# Patient Record
Sex: Female | Born: 1975
Health system: Southern US, Community
[De-identification: ages and names within clinical notes are randomized; demographics above are authoritative.]

## PROBLEM LIST (undated history)

## (undated) DIAGNOSIS — J45909 Unspecified asthma, uncomplicated: Secondary | ICD-10-CM

## (undated) DIAGNOSIS — N200 Calculus of kidney: Secondary | ICD-10-CM

## (undated) DIAGNOSIS — R51 Headache: Secondary | ICD-10-CM

## (undated) DIAGNOSIS — F329 Major depressive disorder, single episode, unspecified: Secondary | ICD-10-CM

## (undated) DIAGNOSIS — F909 Attention-deficit hyperactivity disorder, unspecified type: Secondary | ICD-10-CM

## (undated) DIAGNOSIS — J189 Pneumonia, unspecified organism: Secondary | ICD-10-CM

## (undated) DIAGNOSIS — T7840XA Allergy, unspecified, initial encounter: Secondary | ICD-10-CM

## (undated) DIAGNOSIS — D649 Anemia, unspecified: Secondary | ICD-10-CM

## (undated) DIAGNOSIS — R519 Headache, unspecified: Secondary | ICD-10-CM

## (undated) DIAGNOSIS — F419 Anxiety disorder, unspecified: Secondary | ICD-10-CM

## (undated) DIAGNOSIS — K589 Irritable bowel syndrome without diarrhea: Secondary | ICD-10-CM

## (undated) DIAGNOSIS — K8689 Other specified diseases of pancreas: Secondary | ICD-10-CM

## (undated) DIAGNOSIS — I10 Essential (primary) hypertension: Secondary | ICD-10-CM

## (undated) DIAGNOSIS — R569 Unspecified convulsions: Secondary | ICD-10-CM

## (undated) DIAGNOSIS — G8929 Other chronic pain: Secondary | ICD-10-CM

## (undated) DIAGNOSIS — E669 Obesity, unspecified: Secondary | ICD-10-CM

## (undated) DIAGNOSIS — F32A Depression, unspecified: Secondary | ICD-10-CM

## (undated) HISTORY — DX: Calculus of kidney: N20.0

## (undated) HISTORY — DX: Anxiety disorder, unspecified: F41.9

## (undated) HISTORY — DX: Irritable bowel syndrome, unspecified: K58.9

## (undated) HISTORY — DX: Depression, unspecified: F32.A

## (undated) HISTORY — DX: Headache, unspecified: R51.9

## (undated) HISTORY — DX: Headache: R51

## (undated) HISTORY — DX: Unspecified asthma, uncomplicated: J45.909

## (undated) HISTORY — DX: Allergy, unspecified, initial encounter: T78.40XA

## (undated) HISTORY — DX: Major depressive disorder, single episode, unspecified: F32.9

## (undated) HISTORY — DX: Anemia, unspecified: D64.9

## (undated) HISTORY — DX: Unspecified convulsions: R56.9

## (undated) HISTORY — PX: DILATION AND CURETTAGE OF UTERUS: SHX78

## (undated) HISTORY — PX: WISDOM TOOTH EXTRACTION: SHX21

## (undated) HISTORY — DX: Other chronic pain: G89.29

## (undated) HISTORY — DX: Obesity, unspecified: E66.9

## (undated) HISTORY — DX: Essential (primary) hypertension: I10

## (undated) HISTORY — PX: BREAST SURGERY: SHX581

## (undated) HISTORY — PX: ABLATION: SHX5711

---
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
## 2007-03-06 ENCOUNTER — Emergency Department (HOSPITAL_COMMUNITY): Admission: EM | Admit: 2007-03-06 | Discharge: 2007-03-06 | Payer: Self-pay | Admitting: Emergency Medicine

## 2007-04-13 LAB — CONVERTED CEMR LAB: Pap Smear: NORMAL

## 2007-08-09 ENCOUNTER — Encounter (INDEPENDENT_AMBULATORY_CARE_PROVIDER_SITE_OTHER): Payer: Self-pay | Admitting: Obstetrics and Gynecology

## 2007-08-09 ENCOUNTER — Inpatient Hospital Stay (HOSPITAL_COMMUNITY): Admission: AD | Admit: 2007-08-09 | Discharge: 2007-08-10 | Payer: Self-pay | Admitting: Obstetrics and Gynecology

## 2010-02-11 ENCOUNTER — Ambulatory Visit: Payer: Self-pay | Admitting: Internal Medicine

## 2010-02-11 DIAGNOSIS — J45909 Unspecified asthma, uncomplicated: Secondary | ICD-10-CM | POA: Insufficient documentation

## 2010-02-11 DIAGNOSIS — G43909 Migraine, unspecified, not intractable, without status migrainosus: Secondary | ICD-10-CM | POA: Insufficient documentation

## 2010-02-11 DIAGNOSIS — J069 Acute upper respiratory infection, unspecified: Secondary | ICD-10-CM | POA: Insufficient documentation

## 2010-02-11 DIAGNOSIS — J309 Allergic rhinitis, unspecified: Secondary | ICD-10-CM | POA: Insufficient documentation

## 2010-02-15 DIAGNOSIS — Z87442 Personal history of urinary calculi: Secondary | ICD-10-CM | POA: Insufficient documentation

## 2010-04-08 ENCOUNTER — Ambulatory Visit: Payer: Self-pay | Admitting: Internal Medicine

## 2010-05-12 NOTE — Assessment & Plan Note (Signed)
Summary: new acute congestion-low grade//ccm   Vital Signs:  Patient profile:   35 year old female Menstrual status:  preg LMP:     12/07/2009 Height:      66 inches Weight:      233 pounds BMI:     37.74 O2 Sat:      99 % on Room air Temp:     98.6 degrees F oral Pulse rate:   72 / minute BP sitting:   110 / 70  (right arm) Cuff size:   regular  Vitals Entered By: Romualdo Bolk, CMA (AAMA) (February 11, 2010 11:58 AM)  O2 Flow:  Room air CC: New Pt- Pt is 8 weeks preg.- Coughing, congestion, stuffy head, runny nose, nausea, low grade temp, sore throat due to drainage, tender around neck.  LMP (date): 12/07/2009     Menstrual Status preg Enter LMP: 12/07/2009 Last PAP Result normal   History of Present Illness: Sylvia Cantrell comes in today  for above  acute work in  as a new patient .she's generally well but is now eight weeks pregnant. She's unsure what medications  s he can take.  Husband  became sick   3 days ago  andt hen she got sick for one day that has [prgressed to  now "coughig a lot ".  ? if had a fever.   She states that it is very hard  to breath through  her nose and hard to sleep. Took benadryl and tylenol x 1 .  Has appt with  Nestor Ramp  Nov 17th.   First OB appt. she has a history of asked mom but no chest pain wheezing or recurrence. She also has a history of seasonal allergies. These do not seem to be in play today.  Preventive Care Screening  Pap Smear:    Date:  04/13/2007    Results:  normal    Preventive Screening-Counseling & Management  Alcohol-Tobacco     Alcohol drinks/day: 0     Smoking Status: quit  Caffeine-Diet-Exercise     Caffeine use/day: 1-2     Does Patient Exercise: yes  Safety-Violence-Falls     Seat Belt Use: yes     Firearms in the Home: no firearms in the home     Smoke Detectors: yes      Drug Use:  no.    Current Medications (verified): 1)  Prenatal Vitamins 0.8 Mg Tabs (Prenatal Multivit-Min-Fe-Fa) 2)   Diphenhydramine Hcl 25 Mg Caps (Diphenhydramine Hcl) .... As Needed  Allergies (verified): 1)  Codeine  Past History:  Past Medical History: Allergic rhinitis  worse in sprin g  less in fall.  hx of allergy shots  Asthma quiescent   G3 P1    2  miscarriages   2008 : 6 weeks and 14 weeks.   Varicella  reactive  depression from dads  death  lexapro.  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 HHof 3  Husband  an daughter  Emergency planning/management officer.   Smoking Status:  quit Caffeine use/day:  1-2 Does Patient Exercise:  yes Drug Use:  no Seat Belt Use:  yes  Review of Systems  The patient denies anorexia, weight loss, decreased hearing, hoarseness, chest pain, syncope, dyspnea on exertion, peripheral edema, prolonged cough, headaches, abdominal pain, melena, hematochezia, severe indigestion/heartburn, difficulty walking, abnormal bleeding, enlarged lymph nodes, and angioedema.    Physical Exam  General:  congested coughing in nad looks tired but non toxic  Head:  Normocephalic and atraumatic without obvious abnormalities. No apparent alopecia or balding. Eyes:  clear  eoms nl  Ears:  R ear normal, L ear normal, and no external deformities.   Nose:  no external deformity, no external erythema, and no nasal discharge.  very congested  face non tender Mouth:  pharynx pink and moist.   Neck:  No deformities, masses, or tenderness noted. Lungs:  Normal respiratory effort, chest expands symmetrically. Lungs are clear to auscultation, no crackles or wheezes.no  dullness.   Heart:  Normal rate and regular rhythm. S1 and S2 normal without gallop, murmur, click, rub or other extra sounds.no lifts.   Pulses:  pulses intact without delay   Extremities:  no clubbing cyanosis or edema  Neurologic:  grossly nonfocal Skin:  turgor normal and color normal.   Cervical Nodes:  No lymphadenopathy noted Psych:  Oriented X3, normally interactive, good eye contact, not anxious appearing, and not depressed appearing.     Impression & Recommendations:  Problem # 1:  UPPER RESPIRATORY INFECTION, VIRAL (ICD-465.9) this appears to be viral and probably self-limiting. Inform patient the pregnancy itself can cause increasing nasal congestion and  if gets alarm findings and signs to call us Her updated medication list for this problem includes:    Diphenhydramine Hcl 25 Mg Caps (Diphenhydramine hcl) .Marland Kitchen... As needed  Problem # 2:  ASTHMA (ICD-493.90) appears to be quiescent at present.   call for advice on intervention or recheck if having asthma symptoms.  Problem # 3:  PREGNANCY (ICD-V22.2) early with history of recurrent loss.  caution with  medications.     Complete Medication List: 1)  Prenatal Vitamins 0.8 Mg Tabs (Prenatal multivit-min-fe-fa) 2)  Diphenhydramine Hcl 25 Mg Caps (Diphenhydramine hcl) .... As needed  Patient Instructions: 1)  I think this is a viral respiratory infection . 2)  ok to use tylenol for body aches . 3)  Saline nose spray for nose 4)  Can use afrin  decongestant nose spray for no more than 3 days in a row and then intermittently only to avoid rebound. 5)   Call if fever last or shortness of breath or wheezing  expect  cough to continue for 1-2 weeks but should be improving next week 6)  .   7)  Acute sinusitis symptoms for less than 10 days are not helped by antibiotics. Use warm moist compresses, and over the counter decongestants( only as directed). Call if no improvement in 5-7 days, sooner if increasing pain, fever, or new  symptoms.    Orders Added: 1)  New Patient Level III [16073]

## 2010-05-12 NOTE — Letter (Signed)
Summary: Patient History Form  Patient History Form   Imported By: Maryln Gottron 02/23/2010 13:09:24  _____________________________________________________________________  External Attachment:    Type:   Image     Comment:   External Document

## 2010-08-14 ENCOUNTER — Inpatient Hospital Stay (HOSPITAL_COMMUNITY)
Admission: AD | Admit: 2010-08-14 | Discharge: 2010-08-14 | Disposition: A | Discharge status: Home / Self Care | Payer: Medicaid Other | Source: Ambulatory Visit | Attending: Obstetrics and Gynecology | Admitting: Obstetrics and Gynecology

## 2010-08-14 DIAGNOSIS — O479 False labor, unspecified: Secondary | ICD-10-CM | POA: Insufficient documentation

## 2010-08-14 LAB — URINALYSIS, ROUTINE W REFLEX MICROSCOPIC
Bilirubin Urine: NEGATIVE
Glucose, UA: NEGATIVE mg/dL
Ketones, ur: 15 mg/dL — AB
Nitrite: NEGATIVE
Protein, ur: NEGATIVE mg/dL
Specific Gravity, Urine: >1.03 — ABNORMAL HIGH (ref 1.005–1.030)
Urobilinogen, UA: 0.2 mg/dL (ref 0.0–1.0)
pH: 5.5 (ref 5.0–8.0)

## 2010-08-14 LAB — URINE MICROSCOPIC-ADD ON

## 2010-08-25 NOTE — H&P (Signed)
Sylvia Cantrell, Sylvia Cantrell          ACCOUNT NO.:  [id number]   MEDICAL RECORD NO.:  [id number]          PATIENT TYPE:  INP   LOCATION:  9373                          FACILITY:  [facility]   PHYSICIAN:  Naima A. Dillard, M.D. DATE OF BIRTH:  [birth year]   DATE OF ADMISSION:  08/09/2007  DATE OF DISCHARGE:                              HISTORY & PHYSICAL   This is a 35 year old gravida 3, para 1-0-1-1, at 19-3/7 weeks, who  presents for Cytotec induction of labor secondary to intrauterine fetal  demise in the second trimester.  Pregnancy has been followed by the  Nurse-Midwife service and remarkable for:  1. Frequent UTI.  2. History of irregular cycles.  3. Asthma.  4. History of IVS.  5. History of breast reduction.   HISTORY OF CURRENT PREGNANCY:  The patient presented for care in the  first trimester.  She had an ultrasound to confirm date.  The patient  was noted at her new OB appointment to have a prolapse of her uterus and  cervix, which was found to be low in the vagina whenever the patient was  standing.  The patient was not having any pain with that at that time  and so no treatment was pursued at that time.  She had new OB labs done  and they were all within normal limits, and she came back at 16 weeks  and was doing well.  She declined a quad screen.  She presented on August 07, 2007, for some spotting and cramping that she had had for 2 days  after walking a lot, doing shopping.  Upon exam, fetal heart tones were  not able to be obtained and ultrasound was done, and she was found to  have an IUFD with a fetal size of 14 weeks and 6 days and no fetal  movement or fetal heart rate documented with M-mode and Dopplers, and  she was counseled appropriately and elected to proceed with Cytotec  induction of labor, which was planned to occur today with Dr. Normand Sloop.   PRENATAL LABS:  Hemoglobin 10.9 and platelets 299.  Hepatitis negative,  rubella immune, RPR nonreactive, blood type A+,  antibody screen  negative, chlamydia negative, GC negative, and HIV negative.   ALLERGIES:  CODEINE and SULFA, both cause itching.   OB HISTORY:  Remarkable for a vaginal delivery in 2003 of a female  infant at 22 weeks' gestation, weighing 7 pounds 1 ounce with no  complications.  The patient had a spontaneous abortion in the first  trimester in November 2008.   MEDICAL HISTORY:  Remarkable for past history of anemia, but none  requiring transfusion.  She has a history of childhood varicella and  history of asthma for which she uses albuterol inhalers as needed.  She  also has a history of irritable bowel syndrome.   SURGICAL HISTORY:  Remarkable for breast reduction at age 32,  tonsillectomy as a child, and there was another surgery at age 38 but I  cannot read the writing on her record.   FAMILY HISTORY:  Remarkable for father and grandfather with heart  disease, mother with  hypertension, aunts and grandmother with anemia,  grandmother with hyperthyroidism, aunt and mother with hypothyroidism,  and grandmother with kidney disease.   GENETIC HISTORY:  Unremarkable except for the patient's husband who had  a heart attack in January 2009, he did survive that episode and is  currently age 54.   SOCIAL HISTORY:  The patient is married to Franco Nones who is involved  and supportive.  She does not report a religious affiliation.  She  denies any alcohol, tobacco, or drug use.  Family is supportive.   OBJECTIVE DATA:  VITAL SIGNS:  Stable, afebrile.  HEENT:  Within normal limits.  Thyroid normal, not enlarged.  CHEST:  Clear to auscultation.  HEART:  Heart rate, regular rate and rhythm.  ABDOMEN:  Gravid at 16-20 weeks' size.  Fetal heart tones absent.  PELVIC:  Her pelvic exam is deferred, pending exam by Dr. Normand Sloop.  EXTREMITIES:  Within normal limits.   ASSESSMENT:  1. Intrauterine pregnancy at 19-3/7 weeks by dates.  2. Intrauterine fetal demise.   PLAN:  1. Admit to  AICU.  2. Cytotec induction of labor.  3. Further orders to follow.      Sylvia Cantrell, C.N.M.      Naima A. Normand Sloop, M.D.  Electronically Signed    MLW/MEDQ  D:  08/09/2007  T:  08/10/2007  Job:  [id number]

## 2010-08-25 NOTE — Op Note (Signed)
NAMELOTOYA, CASELLA          ACCOUNT NO.:  [id number]   MEDICAL RECORD NO.:  [id number]          PATIENT TYPE:  INP   LOCATION:  9316                          FACILITY:  [facility]   PHYSICIAN:  Naima A. Dillard, M.D. DATE OF BIRTH:  06-Dec-1975   DATE OF PROCEDURE:  08/09/2007  DATE OF DISCHARGE:                               OPERATIVE REPORT   PREOPERATIVE DIAGNOSIS:  Status post vaginal delivery with fetal demise  with retained placenta.   POSTOPERATIVE DIAGNOSIS:  Status post vaginal delivery with fetal demise  with retained placenta.   PROCEDURE:  Evacuation of retained placenta under ultrasound guidance.   SURGEON:  Naima A. Dillard, MD.   ANESTHESIA:  MAC and local.   COMPLICATIONS:  None.   FINDINGS:  About 12 weeks size uterus with retained placenta, products  of conception or placenta.  The products were sent to pathology.  The  fetus was also sent to pathology.  From the ICU, the patient went to  recovery room in stable condition.   PROCEDURE IN DETAIL:  The patient was taken to the operating room where  she was given MAC anesthesia, placed in dorsal lithotomy position,  prepped and draped in normal sterile fashion.  Her bladder was drained  with straight catheter.  The patient was found to have a 12 weeks size  uterus with some placenta extruded from the cervix.  The cervix was  about 2 cm dilated.  The placenta tissue that was extruded from the  cervix was grasped with ring forceps.  I then under ultrasound guidance,  used a size 9 suction curettage and suctioned most of the placenta out.  I then used a sharp curette to get any remaining bits.  The sharp  curettage was placed in and it was done until a gritty texture was noted  and just some mild blood.  Before the procedure was done, the patient  was given 20 mL of 2% lidocaine for cervical block and tenaculum was  placed on the anterior lip of the cervix.  Tenaculum was removed.  All  instruments removed from  the vagina.  Hemostasis was noted at the  tenaculum point after silver nitrate was used.  Sponge, lap, and needle  counts were correct.  The patient went to recovery room in stable  condition.      Naima A. Normand Sloop, M.D.  Electronically Signed     NAD/MEDQ  D:  08/10/2007  T:  08/10/2007  Job:  [id number]

## 2010-08-25 NOTE — Discharge Summary (Signed)
Sylvia Cantrell          ACCOUNT NO.:  [id number]   MEDICAL RECORD NO.:  [id number]          PATIENT TYPE:  INP   LOCATION:  9316                          [facility]ACILITY:  [facility]   PHYSICIAN:  Hal Morales, M.D.DATE OF BIRTH:  11-13-1975   DATE OF ADMISSION:  08/09/2007  DATE OF DISCHARGE:  08/10/2007                               DISCHARGE SUMMARY   ADMITTING DIAGNOSIS:  Intrauterine fetal demise at 19-3/7th weeks by  dates.   DISCHARGE DIAGNOSES:  1. Intrauterine fetal demise at 19-3/7th weeks by dates.  2. Retained placenta.   PROCEDURES:  1. Cytotec induction for second trimester fetal demise.  2. D and E for retained placenta.  3. MAC and local anesthesia.   HOSPITAL COURSE:  Sylvia Cantrell is a 35 year old gravida 3, para 1-0-1-  1 who was at 19-3/7th weeks who was discovered to have an intrauterine  fetal demise on an office evaluation on April 27.  At that time,  ultrasound was done to confirm the fetal demise.  She elected to repeat  the ultrasound.  This confirmed the finding.  The patient was counseled  on her options and she elected to proceed with Cytotec induction of  labor.  The patient was admitted on April 29, around noon.  Her  pregnancy had been remarkable for the following.  1. History of frequent UTIs.  2. History or irregular cycles.  3. Asthma.  4. History of IVS.  5. History of breast reduction.  6. Uterine prolapse and cystocele.   The patient's blood type was A+.  Her hemoglobin was 10.9 at her new OB  visit.  Cervix was dilated at the time of admission to approximately 4  cm.  Cytotec was placed.  The patient delivered at approximately 10:30  p.m.  There was an attempt by Dr. Normand Sloop to maintain a straight  placenta.  The patient was unable to tolerate this even with IV  sedation.  She was offered Pitocin versus D and C removal of placenta.  She elected D and C at that time.  She also elected to have towards  titers ANA, LAC, ACA, and  chromosome studies on the placenta.  She  declined hemoglobin A1c.  The patient was taken to the operating room  where a D and C was performed for evacuation retained placenta under  ultrasound guidance under MAC and local anesthesia.  Findings were 12  weeks size uterus with retained placenta.  Products of conception went  to pathology.  The patient tolerated the procedure well.  The patient  went to recovery room in good condition.  By August 10, 2007, essentially  post delivery day #1 and post surgery day #0, she was doing well.  She  was desiring to go home.  Her hemoglobin was 9.3, hematocrit was 27.4,  white blood cell count was 7.3, and platelet count was 217.  All the  previously noted labs were still pending.  The patient was having no  orthostatic changes.  Her clinical status was stable.  She was grieving  appropriately and had good family support.  She had been started on  doxycycline and  Methergine.  She was using Percocet for pain with good  relief.  Her bleeding was minimal.  She was deemed to receive full  benefit of her hospital stay and was discharged home.   DISCHARGE INSTRUCTIONS:  The patient is to call for any fever or  increased bleeding or any other concerns.   DISCHARGE MEDICATIONS:  1. Methergine 0.2 mg 1 p.o. q. 6 h. x2 doses to complete a full 24-      hour dosing.  2. Doxycycline 100 mg 1 p.o. b.i.d. x7 days to complete this course.  3. Percocet 5/325 one to two p.o. q. 3-4 h. p.r.n. pain for pain      management.   FOLLOWUP:  Discharge followup will occur on Aug 21, 2007 with Dr.  Normand Sloop at 11:30 a.m. at which time labs and pathology report will be  reviewed with the patient.  Support was also offered to the patient for  her loss.      Renaldo Reel Sylvia Cantrell, C.N.M.      Hal Morales, M.D.  Electronically Signed    VLL/MEDQ  D:  08/10/2007  T:  08/11/2007  Job:  [id number]

## 2010-08-28 ENCOUNTER — Inpatient Hospital Stay (HOSPITAL_COMMUNITY)
Admission: AD | Admit: 2010-08-28 | Discharge: 2010-08-28 | Disposition: A | Discharge status: Home / Self Care | Payer: Medicaid Other | Source: Ambulatory Visit | Attending: Obstetrics & Gynecology | Admitting: Obstetrics & Gynecology

## 2010-08-28 DIAGNOSIS — O479 False labor, unspecified: Secondary | ICD-10-CM | POA: Insufficient documentation

## 2010-09-07 ENCOUNTER — Inpatient Hospital Stay (HOSPITAL_COMMUNITY)
Admission: AD | Admit: 2010-09-07 | Discharge: 2010-09-10 | DRG: 775 | Disposition: A | Discharge status: Home / Self Care | Payer: Medicaid Other | Source: Ambulatory Visit | Attending: Obstetrics and Gynecology | Admitting: Obstetrics and Gynecology

## 2010-09-07 DIAGNOSIS — O09529 Supervision of elderly multigravida, unspecified trimester: Secondary | ICD-10-CM | POA: Diagnosis present

## 2010-09-07 DIAGNOSIS — O480 Post-term pregnancy: Principal | ICD-10-CM | POA: Diagnosis present

## 2010-09-07 LAB — CBC
MCH: 20.6 pg — ABNORMAL LOW (ref 26.0–34.0)
Platelets: 261 10*3/uL (ref 150–400)

## 2010-09-08 LAB — CBC
HCT: 24.9 % — ABNORMAL LOW (ref 36.0–46.0)
MCHC: 30.9 g/dL (ref 30.0–36.0)
Platelets: 203 10*3/uL (ref 150–400)
RBC: 3.57 MIL/uL — ABNORMAL LOW (ref 3.87–5.11)
RDW: 19.5 % — ABNORMAL HIGH (ref 11.5–15.5)
WBC: 14.5 10*3/uL — ABNORMAL HIGH (ref 4.0–10.5)

## 2010-09-08 LAB — URINALYSIS, DIPSTICK ONLY
Ketones, ur: NEGATIVE mg/dL
Leukocytes, UA: NEGATIVE
Protein, ur: NEGATIVE mg/dL

## 2010-09-09 LAB — CBC
Hemoglobin: 6.8 g/dL — CL (ref 12.0–15.0)
Platelets: 212 10*3/uL (ref 150–400)
RBC: 3.23 MIL/uL — ABNORMAL LOW (ref 3.87–5.11)
RDW: 18.8 % — ABNORMAL HIGH (ref 11.5–15.5)

## 2011-01-05 LAB — CBC
HCT: 27.4 — ABNORMAL LOW
Hemoglobin: 9.3 — ABNORMAL LOW
MCHC: 33.9
RDW: 17.3 — ABNORMAL HIGH

## 2011-01-05 LAB — LUPUS ANTICOAGULANT PANEL: PTT Lupus Anticoagulant: 41.8 (ref 36.3–48.8)

## 2011-01-05 LAB — TORCH-IGM(TOXO/ RUB/ CMV/ HSV) W TITER
CMV IgM: 0.14 IV
Rubella IgM Index: 0.48 IV
Toxoplasma IgM: 0.12 IV

## 2011-01-05 LAB — TORCH TITERS-IGG(TOXO/ RUB/ CMV/ HSV): Rubella IgG Scr: 47 IU/mL

## 2011-01-19 LAB — BASIC METABOLIC PANEL
CO2: 27
Chloride: 106
GFR calc Af Amer: >60
Glucose, Bld: 136 — ABNORMAL HIGH
Potassium: 3.3 — ABNORMAL LOW
Sodium: 138

## 2011-01-19 LAB — DIFFERENTIAL
Basophils Relative: 0
Eosinophils Absolute: 0.3
Lymphs Abs: 3.5
Neutro Abs: 6.1
Neutrophils Relative %: 57

## 2011-01-19 LAB — CBC
MCHC: 33.1
Platelets: 330
RBC: 4.53

## 2011-01-19 LAB — WET PREP, GENITAL
Clue Cells Wet Prep HPF POC: NONE SEEN
Trich, Wet Prep: NONE SEEN
Yeast Wet Prep HPF POC: NONE SEEN

## 2011-01-19 LAB — POCT PREGNANCY, URINE: Preg Test, Ur: NEGATIVE

## 2012-02-18 ENCOUNTER — Other Ambulatory Visit: Payer: Self-pay

## 2012-02-18 ENCOUNTER — Ambulatory Visit (INDEPENDENT_AMBULATORY_CARE_PROVIDER_SITE_OTHER): Payer: BC Managed Care – PPO | Admitting: Family Medicine

## 2012-02-18 ENCOUNTER — Encounter: Payer: Self-pay | Admitting: Family Medicine

## 2012-02-18 VITALS — BP 132/79 | HR 87 | Temp 98.7°F | Resp 18 | Ht 66.5 in | Wt 228.0 lb

## 2012-02-18 DIAGNOSIS — R509 Fever, unspecified: Secondary | ICD-10-CM

## 2012-02-18 DIAGNOSIS — N390 Urinary tract infection, site not specified: Secondary | ICD-10-CM

## 2012-02-18 DIAGNOSIS — R358 Other polyuria: Secondary | ICD-10-CM

## 2012-02-18 DIAGNOSIS — R3589 Other polyuria: Secondary | ICD-10-CM

## 2012-02-18 LAB — POCT URINALYSIS DIPSTICK
Glucose, UA: 100
Nitrite, UA: POSITIVE
Protein, UA: 100
Spec Grav, UA: 1.015
Urobilinogen, UA: 4
pH, UA: 5

## 2012-02-18 LAB — POCT UA - MICROSCOPIC ONLY
Casts, Ur, LPF, POC: NEGATIVE
Crystals, Ur, HPF, POC: NEGATIVE
Mucus, UA: POSITIVE
Yeast, UA: NEGATIVE

## 2012-02-18 LAB — POCT URINE PREGNANCY: Preg Test, Ur: NEGATIVE

## 2012-02-18 MED ORDER — CIPROFLOXACIN HCL 500 MG PO TABS: 500.0000 mg | ORAL_TABLET | Freq: Two times a day (BID) | ORAL | Status: DC

## 2012-02-18 NOTE — Patient Instructions (Addendum)
Urinary Tract Infection Urinary tract infections (UTIs) can develop anywhere along your urinary tract. Your urinary tract is your body's drainage system for removing wastes and extra water. Your urinary tract includes two kidneys, two ureters, a bladder, and a urethra. Your kidneys are a pair of bean-shaped organs. Each kidney is about the size of your fist. They are located below your ribs, one on each side of your spine. CAUSES Infections are caused by microbes, which are microscopic organisms, including fungi, viruses, and bacteria. These organisms are so small that they can only be seen through a microscope. Bacteria are the microbes that most commonly cause UTIs. SYMPTOMS  Symptoms of UTIs may vary by age and gender of the patient and by the location of the infection. Symptoms in young women typically include a frequent and intense urge to urinate and a painful, burning feeling in the bladder or urethra during urination. Older women and men are more likely to be tired, shaky, and weak and have muscle aches and abdominal pain. A fever may mean the infection is in your kidneys. Other symptoms of a kidney infection include pain in your back or sides below the ribs, nausea, and vomiting. DIAGNOSIS To diagnose a UTI, your caregiver will ask you about your symptoms. Your caregiver also will ask to provide a urine sample. The urine sample will be tested for bacteria and white blood cells. White blood cells are made by your body to help fight infection. TREATMENT  Typically, UTIs can be treated with medication. Because most UTIs are caused by a bacterial infection, they usually can be treated with the use of antibiotics. The choice of antibiotic and length of treatment depend on your symptoms and the type of bacteria causing your infection. HOME CARE INSTRUCTIONS  If you were prescribed antibiotics, take them exactly as your caregiver instructs you. Finish the medication even if you feel better after you  have only taken some of the medication.  Drink enough water and fluids to keep your urine clear or pale yellow.  Avoid caffeine, tea, and carbonated beverages. They tend to irritate your bladder.  Empty your bladder often. Avoid holding urine for long periods of time.  Empty your bladder before and after sexual intercourse.  After a bowel movement, women should cleanse from front to back. Use each tissue only once. SEEK MEDICAL CARE IF:   You have back pain.  You develop a fever.  Your symptoms do not begin to resolve within 3 days. SEEK IMMEDIATE MEDICAL CARE IF:   You have severe back pain or lower abdominal pain.  You develop chills.  You have nausea or vomiting.  You have continued burning or discomfort with urination. MAKE SURE YOU:   Understand these instructions.  Will watch your condition.  Will get help right away if you are not doing well or get worse. Document Released: 01/06/2005 Document Revised: 09/28/2011 Document Reviewed: 05/07/2011 [employer] Patient Information 2013 [employer].  

## 2012-02-18 NOTE — Progress Notes (Signed)
@[email or web address] @   Patient ID: Sylvia Cantrell MRN: [id number], DOB: [birth year], 36 y.o. Date of Encounter: 02/18/2012, 8:43 AM  Primary Physician: Lorretta Harp, MD  Chief Complaint:  Chief Complaint  Patient presents with  . Urinary Tract Infection    burns with urination  . Abdominal Pain  . Nausea  . Back Pain    HPI: 36 y.o. year old female presents with 4 day history of dysuria, urgency, and frequency. Last UTI was 4 months ago No hematuria LMP:  October 12 No sick contacts, recent antibiotics, or recent travels.  Using IUD No vaginal discharge, back pain, fever  No past medical history on file.   Home Meds: Prior to Admission medications   Medication Sig Start Date End Date Taking? Authorizing Provider  phenazopyridine (PYRIDIUM) 100 MG tablet Take 100 mg by mouth 3 (three) times daily as needed. Patient not sure of dose.   Yes Historical Provider, MD    Allergies:  Allergies  Allergen Reactions  . Codeine     REACTION: itching    History   Social History  . Marital Status: Married    Spouse Name: N/A    Number of Children: N/A  . Years of Education: N/A   Occupational History  . Not on file.   Social History Main Topics  . Smoking status: Never Smoker   . Smokeless tobacco: Not on file  . Alcohol Use: Not on file  . Drug Use: Not on file  . Sexually Active: Not on file   Other Topics Concern  . Not on file   Social History Narrative  . No narrative on file     Review of Systems: Constitutional: negative for chills, fever, night sweats or weight changes Cardiovascular: negative for chest pain or palpitations Respiratory: negative for hemoptysis, wheezing, or shortness of breath Abdominal: negative for abdominal pain, nausea, vomiting or diarrhea Dermatological: negative for rash Neurologic: negative for headache   Physical Exam: Blood pressure 132/79, pulse 87, temperature 98.7 F (37.1 C), temperature source Oral, resp. rate 18,  height 5' 6.5" (1.689 m), weight 228 lb (103.42 kg), last menstrual period 01/22/2012, SpO2 98.00%., Body mass index is 36.25 kg/(m^2). General: Well developed, well nourished, in no acute distress. Head: Normocephalic, atraumatic, eyes without discharge, sclera non-icteric, nares are congested. Bilateral auditory canals clear, TM's are without perforation, pearly grey with reflective cone of light bilaterally. Serous effusion bilaterally behind TM's. Maxillary sinus TTP. Oral cavity moist, dentition normal. Posterior pharynx with post nasal drip and mild erythema. No peritonsillar abscess or tonsillar exudate. Neck: Supple. No thyromegaly. Full ROM. No lymphadenopathy. Lungs: Coarse breath sounds bilaterally without Clear bilaterally to auscultation without wheezes, rales, or rhonchi. Breathing is unlabored.  Heart: RRR with S1 S2. No murmurs, rubs, or gallops appreciated. Abdomen: Soft, non-tender, non-distended with normoactive bowel sounds. No hepatosplenomegaly. No rebound/guarding. No obvious abdominal masses. McBurney's, Rovsing's, Iliopsoas, and table jar all negative. Msk:  Strength and tone normal for age. Extremities: No clubbing or cyanosis. No edema. Neuro: Alert and oriented X 3. Moves all extremities spontaneously. CNII-XII grossly in tact. Psych:  Responds to questions appropriately with a normal affect.   Labs:   ASSESSMENT AND PLAN:  36 y.o. year old female with uti symptoms - -Mucinex -Tylenol/Motrin prn -Rest/fluids -RTC precautions -RTC 3-5 days if no improvement  Signed, Elvina Sidle, MD 02/18/2012 8:43 AM  Results for orders placed in visit on 02/18/12  POCT URINALYSIS DIPSTICK      Component Value Range   Color,  UA orande     Clarity, UA clear     Glucose, UA 100     Bilirubin, UA small     Ketones, UA trace     Spec Grav, UA 1.015     Blood, UA mod     pH, UA 5.0     Protein, UA 100     Urobilinogen, UA 4.0     Nitrite, UA pos     Leukocytes, UA  large (3+)    POCT UA - MICROSCOPIC ONLY      Component Value Range   WBC, Ur, HPF, POC tntc     RBC, urine, microscopic tntc     Bacteria, U Microscopic 3+     Mucus, UA pos     Epithelial cells, urine per micros tntc     Crystals, Ur, HPF, POC neg     Casts, Ur, LPF, POC neg     Yeast, UA neg    POCT URINE PREGNANCY      Component Value Range   Preg Test, Ur Negative

## 2012-02-19 LAB — URINE CULTURE

## 2012-11-20 ENCOUNTER — Ambulatory Visit (INDEPENDENT_AMBULATORY_CARE_PROVIDER_SITE_OTHER): Payer: BC Managed Care – PPO | Admitting: Family Medicine

## 2012-11-20 ENCOUNTER — Ambulatory Visit: Payer: BC Managed Care – PPO

## 2012-11-20 VITALS — BP 128/100 | HR 84 | Temp 98.1°F | Resp 18 | Ht 66.0 in | Wt 233.0 lb

## 2012-11-20 DIAGNOSIS — B3731 Acute candidiasis of vulva and vagina: Secondary | ICD-10-CM

## 2012-11-20 DIAGNOSIS — F411 Generalized anxiety disorder: Secondary | ICD-10-CM

## 2012-11-20 DIAGNOSIS — N899 Noninflammatory disorder of vagina, unspecified: Secondary | ICD-10-CM

## 2012-11-20 DIAGNOSIS — N898 Other specified noninflammatory disorders of vagina: Secondary | ICD-10-CM

## 2012-11-20 DIAGNOSIS — R079 Chest pain, unspecified: Secondary | ICD-10-CM

## 2012-11-20 DIAGNOSIS — R200 Anesthesia of skin: Secondary | ICD-10-CM

## 2012-11-20 DIAGNOSIS — B373 Candidiasis of vulva and vagina: Secondary | ICD-10-CM

## 2012-11-20 DIAGNOSIS — R209 Unspecified disturbances of skin sensation: Secondary | ICD-10-CM

## 2012-11-20 LAB — POCT UA - MICROSCOPIC ONLY
Casts, Ur, LPF, POC: NEGATIVE
Crystals, Ur, HPF, POC: NEGATIVE
Yeast, UA: NEGATIVE

## 2012-11-20 LAB — POCT CBC
Granulocyte percent: 54.7 %G (ref 37–80)
HCT, POC: 43.1 % (ref 37.7–47.9)
Hemoglobin: 13.5 g/dL (ref 12.2–16.2)
Lymph, poc: 2.9 (ref 0.6–3.4)
MCH, POC: 27.7 pg (ref 27–31.2)
MCHC: 31.3 g/dL — AB (ref 31.8–35.4)
MCV: 88.5 fL (ref 80–97)
MID (cbc): 0.4 (ref 0–0.9)
MPV: 9.7 fL (ref 0–99.8)
POC Granulocyte: 4 (ref 2–6.9)
POC LYMPH PERCENT: 39.6 %L (ref 10–50)
POC MID %: 5.7 % (ref 0–12)
Platelet Count, POC: 234 10*3/uL (ref 142–424)
RBC: 4.87 M/uL (ref 4.04–5.48)
RDW, POC: 13.9 %
WBC: 7.3 10*3/uL (ref 4.6–10.2)

## 2012-11-20 LAB — POCT WET PREP WITH KOH
KOH Prep POC: POSITIVE
Trichomonas, UA: NEGATIVE
Yeast Wet Prep HPF POC: NEGATIVE

## 2012-11-20 LAB — POCT URINALYSIS DIPSTICK
Bilirubin, UA: NEGATIVE
Glucose, UA: NEGATIVE
Leukocytes, UA: NEGATIVE
Nitrite, UA: NEGATIVE
Protein, UA: NEGATIVE
Spec Grav, UA: 1.025
Urobilinogen, UA: 0.2
pH, UA: 6

## 2012-11-20 MED ORDER — FLUCONAZOLE 150 MG PO TABS: 150.0000 mg | ORAL_TABLET | Freq: Once | ORAL | Status: DC

## 2012-11-20 MED ORDER — LORAZEPAM 0.5 MG PO TABS: ORAL_TABLET | ORAL | Status: DC

## 2012-11-20 NOTE — Progress Notes (Signed)
[facility]:  Office Visit  Chief Complaint:  Chief Complaint  Patient presents with  . Chest Pain    stabbing pain saturday for , pain again today at work, shaking in both hands, pain on right side of chest  . Dizziness  . numbness on right side of face  . pins and needlles feeling  . Headache  . vaginal itching and discharge    has had for a while     HPI: Sylvia Cantrell is a 37 y.o. female who complains of:  1. Intermittent tremors since this AM, she also midepigastric CP radiating to right side, associated with numbness on right jaw, pins and needles in her right arm, HA on right side of her tempral lobe. All pf theses sxs started at 12 pm-1 pm. The CP she has had on and off intermittently since Saturday,  she was CP free yesterday. She has never had sxs like this before. She has had anxiety before and has had some similar sxs but not all of them together at once.  She denies GERD. She has IBS. She had a severe bout of IBS before her sxs started, additionally there is a lot of stressors in her life right now, she had a huge fight with her pre-teen daughter last night and the family has financial problems and they are supposed to move September 1st.  She did have onions with eggs in the morning on Saturday and coffee. She has coffee evey morning so it is not GERD related.  Denies diaphoresis. She felt "weird" , numbness in face. She has had numbness on her face in the past with migraines. Her typical migraine pattern is stars in one eye nad loss of vision adn then she has explosive HA nd numbness but again the sxs do not all happen at once.  She has not tried anyhting for this except ibuprofen which she takes daily 800 mg for back pain. She usually takes it with food.  + nausea without emesis.  She is under a lot of stress. She has had some coughing and allergy sxs but no active URI .  Denies HTN, DM, XOL Mother has had a couple of "ministrokes" Father with MI  at age 90  Vision is normal, she deneis asymmetric weakness. She feels weakness bilaterally Denies slurred speech  2. She also thinks she may have a yeast infection, she has been treating it with OTC yeast treatment.Last pap was 2 years ago, no history of abnormal paps,+ white dc,+ vaginal itchiness + pelvic pain,No odor, no urinary sxs.     Past Medical History  Diagnosis Date  . Allergy   . Depression   . Anemia   . Anxiety   . Asthma    Past Surgical History  Procedure Laterality Date  . Breast surgery     History   Social History  . Marital Status: Married    Spouse Name: N/A    Number of Children: N/A  . Years of Education: N/A   Social History Main Topics  . Smoking status: Never Smoker   . Smokeless tobacco: None  . Alcohol Use: None  . Drug Use: None  . Sexually Active: None   Other Topics Concern  . None   Social History Narrative  . None   Family History  Problem Relation Age of Onset  . Hypertension Mother   . Hyperthyroidism Mother   . Heart failure Father   . Heart failure Maternal Grandmother   .  Hypertension Maternal Grandmother   . Heart failure Paternal Grandfather    Allergies  Allergen Reactions  . Codeine     REACTION: itching   Prior to Admission medications   Medication Sig Start Date End Date Taking? Authorizing Provider  Chlorphen-Pseudoephed-APAP [facility] FLU/COLD PO) Take by mouth.   Yes Historical Provider, MD  ibuprofen (ADVIL,MOTRIN) 800 MG tablet Take 800 mg by mouth every 8 (eight) hours as needed for pain.   Yes Historical Provider, MD     ROS: The patient denies fevers, chills, night sweats, unintentional weight loss, palpitations, wheezing, dyspnea on exertion, abdominal pain, dysuria, hematuria, melena,  All other systems have been reviewed and were otherwise negative with the exception of those mentioned in the HPI and as above.    PHYSICAL EXAM: Filed Vitals:   11/20/12 1540  BP: 128/100  Pulse: 84  Temp:  98.1 F (36.7 C)  Resp: 18   Filed Vitals:   11/20/12 1540  Height: 5\' 6"  (1.676 m)  Weight: 233 lb (105.688 kg)   Body mass index is 37.63 kg/(m^2).  General: Alert, anxious caucasian female HEENT:  Normocephalic, atraumatic, oropharynx patent. EOMI, PERRLA, fundoscopic exam nl.  Cardiovascular:  Regular rate and rhythm, no rubs murmurs or gallops.  No Carotid bruits, radial pulse intact. No pedal edema.  Respiratory: Clear to auscultation bilaterally.  No wheezes, rales, or rhonchi.  No cyanosis, no use of accessory musculature GI: No organomegaly, abdomen is soft and non-tender, positive bowel sounds.  No masses. Skin: No rashes. Neurologic: Facial musculature symmetric. CN 2-12 grossly normal Psychiatric: Patient is appropriate throughout our interaction. Lymphatic: No cervical lymphadenopathy Musculoskeletal: Gait intact. 5/5 strength, 2/2 DTRs   LABS: Results for orders placed in visit on 11/20/12  POCT CBC      Result Value Range   WBC 7.3  4.6 - 10.2 K/uL   Lymph, poc 2.9  0.6 - 3.4   POC LYMPH PERCENT 39.6  10 - 50 %L   MID (cbc) 0.4  0 - 0.9   POC MID % 5.7  0 - 12 %M   POC Granulocyte 4.0  2 - 6.9   Granulocyte percent 54.7  37 - 80 %G   RBC 4.87  4.04 - 5.48 M/uL   Hemoglobin 13.5  12.2 - 16.2 g/dL   HCT, POC 16.1  09.6 - 47.9 %   MCV 88.5  80 - 97 fL   MCH, POC 27.7  27 - 31.2 pg   MCHC 31.3 (*) 31.8 - 35.4 g/dL   RDW, POC 04.5     Platelet Count, POC 234  142 - 424 K/uL   MPV 9.7  0 - 99.8 fL  POCT URINALYSIS DIPSTICK      Result Value Range   Color, UA yellow     Clarity, UA hazy     Glucose, UA neg     Bilirubin, UA neg     Ketones, UA trace     Spec Grav, UA 1.025     Blood, UA large     pH, UA 6.0     Protein, UA neg     Urobilinogen, UA 0.2     Nitrite, UA neg     Leukocytes, UA Negative    POCT UA - MICROSCOPIC ONLY      Result Value Range   WBC, Ur, HPF, POC 1-2     RBC, urine, microscopic tntc     Bacteria, U Microscopic trace      Mucus,  UA moderate     Epithelial cells, urine per micros 2-3     Crystals, Ur, HPF, POC neg     Casts, Ur, LPF, POC neg     Yeast, UA neg    POCT WET PREP WITH KOH      Result Value Range   Trichomonas, UA Negative     Clue Cells Wet Prep HPF POC 0-1     Epithelial Wet Prep HPF POC 3-7     Yeast Wet Prep HPF POC neg     Bacteria Wet Prep HPF POC 2+     RBC Wet Prep HPF POC tntc     WBC Wet Prep HPF POC 2-5     KOH Prep POC Positive       EKG/XRAY:   Primary read interpreted by Dr. Conley Rolls at [facility]. Increase vascular markings No infiltrates, pneumothorax, effusion   ASSESSMENT/PLAN: Encounter Diagnoses  Name Primary?  . Chest pain Yes  . Vaginal irritation   . Numbness of fingers of both hands   . Yeast vaginitis     EKG and CXR normal Most likely GERD and stress related anxiety and NSAID use GI cocktail given, PPI to take otc Repeat Blood Pressure 146/96, will monitor There has been a lot of stressors: pre-teen daughter ( they had a major argument yesterday before her sxs started but she was hesistant to tell me what it was about, Financial problems, have to move in Sept) Had stomach prboblems with SSRI so will not try for this acute situation Will rx short course of Ativan Rx Diuflucan F/u in 1 month for reassessment.  F/u prn or go to ER if worsening sxs Gross sideeffects, risk and benefits, and alternatives of medications d/w patient. Patient is aware that all medications have potential sideeffects and we are unable to predict every sideeffect or drug-drug interaction that may occur.       Hamilton Capri PHUONG, DO 11/20/2012 5:12 PM

## 2012-11-21 LAB — COMPREHENSIVE METABOLIC PANEL WITH GFR
ALT: 12 U/L (ref 0–35)
Albumin: 4.3 g/dL (ref 3.5–5.2)
CO2: 25 meq/L (ref 19–32)
Glucose, Bld: 77 mg/dL (ref 70–99)
Potassium: 3.8 meq/L (ref 3.5–5.3)
Sodium: 140 meq/L (ref 135–145)
Total Protein: 6.9 g/dL (ref 6.0–8.3)

## 2012-11-21 LAB — COMPREHENSIVE METABOLIC PANEL
AST: 12 U/L (ref 0–37)
Alkaline Phosphatase: 84 U/L (ref 39–117)
BUN: 11 mg/dL (ref 6–23)
Calcium: 9.2 mg/dL (ref 8.4–10.5)
Chloride: 106 mEq/L (ref 96–112)
Creat: 0.8 mg/dL (ref 0.50–1.10)
Total Bilirubin: 0.4 mg/dL (ref 0.3–1.2)

## 2013-03-12 ENCOUNTER — Telehealth: Payer: Self-pay

## 2013-03-12 ENCOUNTER — Ambulatory Visit (INDEPENDENT_AMBULATORY_CARE_PROVIDER_SITE_OTHER): Payer: BC Managed Care – PPO | Admitting: Family Medicine

## 2013-03-12 VITALS — BP 118/70 | HR 106 | Temp 98.0°F | Resp 16 | Ht 66.75 in | Wt 243.2 lb

## 2013-03-12 DIAGNOSIS — B3731 Acute candidiasis of vulva and vagina: Secondary | ICD-10-CM

## 2013-03-12 DIAGNOSIS — Z880 Allergy status to penicillin: Secondary | ICD-10-CM

## 2013-03-12 DIAGNOSIS — F411 Generalized anxiety disorder: Secondary | ICD-10-CM

## 2013-03-12 DIAGNOSIS — R21 Rash and other nonspecific skin eruption: Secondary | ICD-10-CM

## 2013-03-12 DIAGNOSIS — B373 Candidiasis of vulva and vagina: Secondary | ICD-10-CM

## 2013-03-12 DIAGNOSIS — T782XXA Anaphylactic shock, unspecified, initial encounter: Secondary | ICD-10-CM

## 2013-03-12 DIAGNOSIS — T7840XA Allergy, unspecified, initial encounter: Secondary | ICD-10-CM

## 2013-03-12 DIAGNOSIS — J019 Acute sinusitis, unspecified: Secondary | ICD-10-CM

## 2013-03-12 DIAGNOSIS — N898 Other specified noninflammatory disorders of vagina: Secondary | ICD-10-CM

## 2013-03-12 LAB — POCT WET PREP WITH KOH
Clue Cells Wet Prep HPF POC: NEGATIVE
KOH Prep POC: NEGATIVE
Trichomonas, UA: NEGATIVE
Yeast Wet Prep HPF POC: NEGATIVE

## 2013-03-12 MED ORDER — EPINEPHRINE 0.3 MG/0.3ML IJ SOAJ: 0.3000 mg | Freq: Once | INTRAMUSCULAR | Status: DC

## 2013-03-12 MED ORDER — DIPHENHYDRAMINE HCL 50 MG/ML IJ SOLN
25.0000 mg | Freq: Once | INTRAMUSCULAR | Status: AC
  Administered 2013-03-12: 25 mg via INTRAMUSCULAR

## 2013-03-12 MED ORDER — PREDNISONE 20 MG PO TABS: ORAL_TABLET | ORAL | Status: DC

## 2013-03-12 MED ORDER — METHYLPREDNISOLONE SODIUM SUCC 125 MG IJ SOLR
125.0000 mg | Freq: Once | INTRAMUSCULAR | Status: AC
  Administered 2013-03-12: 125 mg via INTRAVENOUS

## 2013-03-12 MED ORDER — FLUCONAZOLE 150 MG PO TABS: 150.0000 mg | ORAL_TABLET | Freq: Once | ORAL | Status: DC

## 2013-03-12 MED ORDER — AZITHROMYCIN 250 MG PO TABS: ORAL_TABLET | ORAL | Status: DC

## 2013-03-12 MED ORDER — LORAZEPAM 0.5 MG PO TABS: ORAL_TABLET | ORAL | Status: DC

## 2013-03-12 MED ORDER — AMOXICILLIN 500 MG PO CAPS: 500.0000 mg | ORAL_CAPSULE | Freq: Three times a day (TID) | ORAL | Status: DC

## 2013-03-12 NOTE — Telephone Encounter (Signed)
If she gets worse she should go to ER. Called to advise. PCN allergies can be very serious, should not wait until tomorrow. Left detailed message AND told her to call me back.

## 2013-03-12 NOTE — Progress Notes (Signed)
   Subjective:    Patient ID: Sylvia Cantrell, female    DOB: 1975/06/29, 37 y.o.   MRN: [id number]  HPI pt returned to clinic at 515pm with hives, rash, itching, all over. She does have some tightness in throat. Worse on chest and back. Was put on amoxicillin from earlier visit today around 315 pm. She took Benadryl 50 mg PO 30 min ago (445pm)  She hasn't had an allergic reaction to amoxicillin before.      Review of Systems     Objective:   Physical Exam        Assessment & Plan:

## 2013-03-12 NOTE — Patient Instructions (Addendum)
Hives Hives are itchy, red, swollen areas of the skin. They can vary in size and location on your body. Hives can come and go for hours or several days (acute hives) or for several weeks (chronic hives). Hives do not spread from person to person (noncontagious). They may get worse with scratching, exercise, and emotional stress. CAUSES   Allergic reaction to food, additives, or drugs.  Infections, including the common cold.  Illness, such as vasculitis, lupus, or thyroid disease.  Exposure to sunlight, heat, or cold.  Exercise.  Stress.  Contact with chemicals. SYMPTOMS   Red or white swollen patches on the skin. The patches may change size, shape, and location quickly and repeatedly.  Itching.  Swelling of the hands, feet, and face. This may occur if hives develop deeper in the skin. DIAGNOSIS  Your caregiver can usually tell what is wrong by performing a physical exam. Skin or blood tests may also be done to determine the cause of your hives. In some cases, the cause cannot be determined. TREATMENT  Mild cases usually get better with medicines such as antihistamines. Severe cases may require an emergency epinephrine injection. If the cause of your hives is known, treatment includes avoiding that trigger.  HOME CARE INSTRUCTIONS   Avoid causes that trigger your hives.  Take antihistamines as directed by your caregiver to reduce the severity of your hives. Non-sedating or low-sedating antihistamines are usually recommended. Do not drive while taking an antihistamine.  Take any other medicines prescribed for itching as directed by your caregiver.  Wear loose-fitting clothing.  Keep all follow-up appointments as directed by your caregiver. SEEK MEDICAL CARE IF:   You have persistent or severe itching that is not relieved with medicine.  You have painful or swollen joints. SEEK IMMEDIATE MEDICAL CARE IF:   You have a fever.  Your tongue or lips are swollen.  You have  trouble breathing or swallowing.  You feel tightness in the throat or chest.  You have abdominal pain. These problems may be the first sign of a life-threatening allergic reaction. Call your local emergency services (911 in [place] MAKE SURE YOU:   Understand these instructions.  Will watch your condition.  Will get help right away if you are not doing well or get worse. Document Released: 03/29/2005 Document Revised: 09/28/2011 Document Reviewed: 06/22/2011 [facility] Patient Information 2014 [place], [place].   Wait until tomorrow to start her other antibiotics  Return if worse or call 911 if needed  Use EpiPen in case of emergency allergic reaction  Continue taking an antihistamine daily. I would recommend Zyrtec one daily for about 5 days  Take ranitidine 150 mg twice daily (Zantac) if rash is continuing. This helps this also even though it is a stomach pill.

## 2013-03-12 NOTE — Progress Notes (Signed)
Chief Complaint:  Chief Complaint  Patient presents with  . Nasal Congestion    sinus pressure  . Sore Throat  . Cough  . Vaginitis    with periods monthly    HPI: Sylvia Cantrell is a 37 y.o. female who is here for Vaginal discharge, itching on/off x 1 year as well as nasal - whitish yellow, sinus pressure, ear soreness - L, HA for three weeks. She states the vaginal discharge and itching has been going on for one year. She states she was treated her in August/September 2014 and the symptoms have come back. She states the symptoms usually start before her menstrual cycle. She states she has tried OTC Monistat but has had no success. She states the cough-dry, nasal, sinus pressure and ear soreness has gotten worse over the last two or three days. She has tried OTC DayQuil/NyQuil and Claritin but has had no success.  Was given Diflucan and it is about a week before her period she starts ahving it again White dc but had has had some urinary sxs in the pelvic region No fevers, chills, nausea, vomiting She has taken AZO yeast  Past Medical History  Diagnosis Date  . Allergy   . Depression   . Anemia   . Anxiety   . Asthma    Past Surgical History  Procedure Laterality Date  . Breast surgery     History   Social History  . Marital Status: Married    Spouse Name: N/A    Number of Children: N/A  . Years of Education: N/A   Social History Main Topics  . Smoking status: Never Smoker   . Smokeless tobacco: None  . Alcohol Use: None  . Drug Use: None  . Sexual Activity: None   Other Topics Concern  . None   Social History Narrative  . None   Family History  Problem Relation Age of Onset  . Hypertension Mother   . Hyperthyroidism Mother   . Heart failure Father   . Heart failure Maternal Grandmother   . Hypertension Maternal Grandmother   . Heart failure Paternal Grandfather    Allergies  Allergen Reactions  . Codeine     REACTION: itching   Prior to  Admission medications   Medication Sig Start Date End Date Taking? Authorizing Provider  ibuprofen (ADVIL,MOTRIN) 800 MG tablet Take 800 mg by mouth every 8 (eight) hours as needed for pain.   Yes Historical Provider, MD  LORazepam (ATIVAN) 0.5 MG tablet Take 1/2-1 tab PO BID prn 11/20/12  Yes Gurjot Brisco P Chantal Worthey, DO  fluconazole (DIFLUCAN) 150 MG tablet Take 1 tablet (150 mg total) by mouth once. 11/20/12   Eula Mazzola P Jahree Dermody, DO     ROS: The patient denies fevers, chills, night sweats, unintentional weight loss, chest pain, palpitations, wheezing, dyspnea on exertion, nausea, vomiting, abdominal pain, dysuria, hematuria, melena, numbness, weakness, or tingling.   All other systems have been reviewed and were otherwise negative with the exception of those mentioned in the HPI and as above.    PHYSICAL EXAM: Filed Vitals:   03/12/13 1150  BP: 120/80  Pulse: 72  Temp: 98 F (36.7 C)  Resp: 16   Filed Vitals:   03/12/13 1150  Height: 5' 6.75" (1.695 m)  Weight: 243 lb 3.2 oz (110.315 kg)   Body mass index is 38.4 kg/(m^2).  General: Alert, no acute distress HEENT:  Normocephalic, atraumatic, oropharynx patent. EOMI, PERRLA. + sinus tenderness, boggy nares  Cardiovascular:  Regular rate and rhythm, no rubs murmurs or gallops.  No Carotid bruits, radial pulse intact. No pedal edema.  Respiratory: Clear to auscultation bilaterally.  No wheezes, rales, or rhonchi.  No cyanosis, no use of accessory musculature GI: No organomegaly, abdomen is soft and non-tender, positive bowel sounds.  No masses. Skin: No rashes. Neurologic: Facial musculature symmetric. Psychiatric: Patient is appropriate throughout our interaction. Lymphatic: No cervical lymphadenopathy Musculoskeletal: Gait intact.   LABS: Results for orders placed in visit on 03/12/13  POCT WET PREP WITH KOH      Result Value Range   Trichomonas, UA Negative     Clue Cells Wet Prep HPF POC neg     Epithelial Wet Prep HPF POC 6-8     Yeast Wet  Prep HPF POC neg     Bacteria Wet Prep HPF POC 1+     RBC Wet Prep HPF POC 0-2     WBC Wet Prep HPF POC 0-4     KOH Prep POC Negative       EKG/XRAY:   Primary read interpreted by Dr. Conley Rolls at [facility].   ASSESSMENT/PLAN: Encounter Diagnoses  Name Primary?  . Sinusitis, acute Yes  . Vaginal discharge   . Anxiety state, unspecified    Rx Amoxacillin Rx Diflucan Rx Ativan F/u prn  Gross sideeffects, risk and benefits, and alternatives of medications d/w patient. Patient is aware that all medications have potential sideeffects and we are unable to predict every sideeffect or drug-drug interaction that may occur.  Renisha Cockrum PHUONG, DO 03/12/2013 1:22 PM

## 2013-03-12 NOTE — Telephone Encounter (Signed)
Patient calling because she is having an allergic reaction to the antibiotic given today and she reports symptoms like: Itching all over her body and turning bright red spots with constant sneezing. I spoke to [facility]  and says the patient needs to be seen. Told patient and she says she will come tomorrow  (Due to her being with her kids) and stop taking the medication.   Best:  [phone]

## 2013-03-12 NOTE — Telephone Encounter (Signed)
Called encouraged her to come in today/now she will get baby sitter and come in now today, documented PCN allergy

## 2013-03-12 NOTE — Progress Notes (Signed)
Subjective: Was here earlier today and treated for sinus problem with amoxicillin. She had no prior history of amoxicillin allergy. She took the amoxicillin about 3:15. About one hour later she broke out with hives she started itching worse and worse all over she came back in here. She is taking 50 of diphenhydramine at about 4:30. No breathing difficulty.  Objective: Diffuse erythematous rash over the greater part of her body. It itches. Lungs are clear. Heart regular, slightly chronic. She is anxious. Her throat was clear except the uvula is mildly reddened and pointed. I suspect this is from her sinusitis and postnasal drainage.  Assessment: Acute new penicillin allergy Urticaria  Plan: Solu-Medrol 125 IV Start the IV Benadryl 25 IM Observedclosely  After observing for one hour the rash was gone and she is breathing easier. Her lungs are clear. She realizes that she was short of breath earlier in comparison to now, and . I believe this qualifies for anaphylaxis.  EpiPen

## 2013-06-04 ENCOUNTER — Ambulatory Visit (INDEPENDENT_AMBULATORY_CARE_PROVIDER_SITE_OTHER): Payer: BC Managed Care – PPO | Admitting: Physician Assistant

## 2013-06-04 VITALS — BP 128/82 | HR 126 | Temp 99.0°F | Resp 17 | Ht 67.0 in | Wt 237.0 lb

## 2013-06-04 DIAGNOSIS — R0789 Other chest pain: Secondary | ICD-10-CM

## 2013-06-04 DIAGNOSIS — J339 Nasal polyp, unspecified: Secondary | ICD-10-CM

## 2013-06-04 DIAGNOSIS — J45909 Unspecified asthma, uncomplicated: Secondary | ICD-10-CM

## 2013-06-04 MED ORDER — MUCINEX DM MAXIMUM STRENGTH 60-1200 MG PO TB12: 1.0000 | ORAL_TABLET | Freq: Two times a day (BID) | ORAL | Status: DC

## 2013-06-04 MED ORDER — ALBUTEROL SULFATE HFA 108 (90 BASE) MCG/ACT IN AERS: 2.0000 | INHALATION_SPRAY | Freq: Four times a day (QID) | RESPIRATORY_TRACT | Status: DC | PRN

## 2013-06-04 MED ORDER — HYDROCOD POLST-CHLORPHEN POLST 10-8 MG/5ML PO LQCR: 5.0000 mL | Freq: Two times a day (BID) | ORAL | Status: AC

## 2013-06-04 MED ORDER — ALBUTEROL SULFATE (2.5 MG/3ML) 0.083% IN NEBU
2.5000 mg | INHALATION_SOLUTION | Freq: Once | RESPIRATORY_TRACT | Status: AC
  Administered 2013-06-04: 2.5 mg via RESPIRATORY_TRACT

## 2013-06-04 MED ORDER — TRIAMCINOLONE ACETONIDE 55 MCG/ACT NA AERO: 2.0000 | INHALATION_SPRAY | Freq: Every day | NASAL | Status: DC

## 2013-06-04 NOTE — Patient Instructions (Signed)
Use albuterol for cough/SOB or wheezing. Push fluids.

## 2013-06-04 NOTE — Progress Notes (Signed)
Subjective:    Patient ID: Sylvia Cantrell, female    DOB: 31-Jan-1976, 38 y.o.   MRN: [id number]  HPI Pt presents to clinic with about a week of chest tightness and nasal congestion that patient felt was a result of the cold weather because she has had that in the past and then yesterday she started to feel really bad with laryngitis and increased nasal congestion and cough with increased chest tightness.  She had a little dizziness yesterday when she felt her worse but it was more lightheadedness.  Her voice goes in and out.  She has yellow rhinorrhea in the am only.  Her cough has not been productive but she feels like there is congestion that is just not loose enough to get out.  She cannot take a deep breath without coughing.  She has been kept awake from the cough.  OTC meds - tylenol for HA Sick contacts - none Flu vaccine - no  Review of Systems  Constitutional: Negative for fever and chills.  HENT: Positive for congestion, postnasal drip, rhinorrhea (bright yellow), sore throat (mild) and voice change.   Respiratory: Positive for cough (dry).   Gastrointestinal: Positive for nausea (2nd to PND). Negative for vomiting and diarrhea.  Musculoskeletal: Negative for myalgias.  Neurological: Positive for dizziness and headaches.       Objective:   Physical Exam  Vitals reviewed. Constitutional: She is oriented to person, place, and time. She appears well-developed and well-nourished.  HENT:  Head: Normocephalic and atraumatic.  Right Ear: Hearing, external ear and ear canal normal. A middle ear effusion (serous fluid) is present.  Left Ear: Hearing, tympanic membrane, external ear and ear canal normal.  Nose: Mucosal edema (red) and rhinorrhea (clear) present.  Mouth/Throat: Uvula is midline, oropharynx is clear and moist and mucous membranes are normal.  ? Polyp - right nare  Eyes: Conjunctivae are normal.  Neck: Normal range of motion.  Cardiovascular: Normal rate, regular  rhythm and normal heart sounds.   No murmur heard. Pulmonary/Chest: Effort normal.  Pt is unable to take a deep breath without coughing - there is not good expiratory air movement  Lymphadenopathy:       Head (right side): No tonsillar, no preauricular and no occipital adenopathy present.       Head (left side): No tonsillar, no preauricular and no occipital adenopathy present.    She has no cervical adenopathy.       Right: No supraclavicular adenopathy present.       Left: No supraclavicular adenopathy present.  Neurological: She is alert and oriented to person, place, and time.  Skin: Skin is warm and dry.  Psychiatric: She has a normal mood and affect. Her behavior is normal. Judgment and thought content normal.    Pt given albuterol neb and she states she feels much better.  She has full inspiration and expiration.  She has no wheezing and is able to take a deep breath without coughing.      Assessment & Plan:  Chest tightness - Plan: albuterol (PROVENTIL) (2.5 MG/3ML) 0.083% nebulizer solution 2.5 mg, albuterol (PROVENTIL HFA;VENTOLIN HFA) 108 (90 BASE) MCG/ACT inhaler  Asthmatic bronchitis - Plan: chlorpheniramine-HYDROcodone (TUSSIONEX PENNKINETIC ER) 10-8 MG/5ML LQCR, Dextromethorphan-Guaifenesin (MUCINEX DM MAXIMUM STRENGTH) 60-1200 MG TB12  Nasal polyp - Plan: triamcinolone (NASACORT AQ) 55 MCG/ACT AERO nasal inhaler  Symptomatic treatment d/w pt.  She will start using her albuterol inhaler.  We will treat her cough in hopes that she will sleep better  and therefore feel better.  Patient's questions were answered.  Benny LennertSarah Jennie Hannay PA-C 06/04/2013 9:51 AM

## 2013-09-05 ENCOUNTER — Ambulatory Visit (INDEPENDENT_AMBULATORY_CARE_PROVIDER_SITE_OTHER): Payer: BC Managed Care – PPO | Admitting: Family Medicine

## 2013-09-05 VITALS — BP 125/90 | HR 82 | Temp 98.1°F | Resp 18 | Wt 249.0 lb

## 2013-09-05 DIAGNOSIS — L237 Allergic contact dermatitis due to plants, except food: Secondary | ICD-10-CM

## 2013-09-05 DIAGNOSIS — L255 Unspecified contact dermatitis due to plants, except food: Secondary | ICD-10-CM

## 2013-09-05 MED ORDER — PREDNISONE 20 MG PO TABS: ORAL_TABLET | ORAL | Status: DC

## 2013-09-05 NOTE — Progress Notes (Signed)
   Subjective:    Patient ID: Sylvia Cantrell, female    DOB: 12/21/1975, 38 y.o.   MRN: [id number]  HPI Patient was gardening 6 days ago and had rash on her hand with blisters. Treated with calamine lotion. Hands have gradually resolved. Noticed rash on neck 3 days ago and on chest this morning. It is very itchy and she is having a difficult time avoiding scratching. She has a history of poison ivy reaction requiring prednisone in the past.   Has taken her daily zyrtec and has added benadryl for the last two nights with some resolution of itching.   Patient has a history of asthma. She had to use albuterol inhaler more frequently this spring. Has not used in 3 weeks.   Review of Systems No wheezing, runny nose, throat feel itchy    Objective:   Physical Exam  Vitals reviewed. Constitutional: She is oriented to person, place, and time. She appears well-developed and well-nourished.  HENT:  Head: Normocephalic and atraumatic.  Right Ear: External ear normal.  Left Ear: External ear normal.  Nose: Nose normal.  Mouth/Throat: Oropharynx is clear and moist.  Eyes: Conjunctivae are normal. Right eye exhibits no discharge. Left eye exhibits no discharge.  Neck: Normal range of motion. Neck supple.  Cardiovascular: Normal rate, regular rhythm and normal heart sounds.   Pulmonary/Chest: Effort normal and breath sounds normal.  Musculoskeletal: Normal range of motion.  Neurological: She is alert and oriented to person, place, and time.  Skin: Skin is warm and dry. Rash noted. There is erythema.  Interdigits x 2 with few blisters. Chest with erythematous, papules, some linear. Small amount of erythema along left side of nose, under eye.  Psychiatric: She has a normal mood and affect. Her behavior is normal. Judgment and thought content normal.      Assessment & Plan:  1. Poison ivy dermatitis - predniSONE (DELTASONE) 20 MG tablet; Take 3 PO QAM x3days, 2 PO QAM x3days, 1 PO QAM x3days   Dispense: 18 tablet; Refill: 0 -provided written and verbal information regarding diagnosis. Instructed patient to return if no improvement in 48 hours or if worsening, especially into eye.  Emi Belfast, FNP-BC  [facility], [facility]  09/05/2013 6:01 PM

## 2013-09-05 NOTE — Patient Instructions (Signed)
Poison Ivy Poison ivy is a inflammation of the skin (contact dermatitis) caused by touching the allergens on the leaves of the ivy plant following previous exposure to the plant. The rash usually appears 48 hours after exposure. The rash is usually bumps (papules) or blisters (vesicles) in a linear pattern. Depending on your own sensitivity, the rash may simply cause redness and itching, or it may also progress to blisters which may break open. These must be well cared for to prevent secondary bacterial (germ) infection, followed by scarring. Keep any open areas dry, clean, dressed, and covered with an antibacterial ointment if needed. The eyes may also get puffy. The puffiness is worst in the morning and gets better as the day progresses. This dermatitis usually heals without scarring, within 2 to 3 weeks without treatment. HOME CARE INSTRUCTIONS  Thoroughly wash with soap and water as soon as you have been exposed to poison ivy. You have about one half hour to remove the plant resin before it will cause the rash. This washing will destroy the oil or antigen on the skin that is causing, or will cause, the rash. Be sure to wash under your fingernails as any plant resin there will continue to spread the rash. Do not rub skin vigorously when washing affected area. Poison ivy cannot spread if no oil from the plant remains on your body. A rash that has progressed to weeping sores will not spread the rash unless you have not washed thoroughly. It is also important to wash any clothes you have been wearing as these may carry active allergens. The rash will return if you wear the unwashed clothing, even several days later. Avoidance of the plant in the future is the best measure. Poison ivy plant can be recognized by the number of leaves. Generally, poison ivy has three leaves with flowering branches on a single stem. Diphenhydramine may be purchased over the counter and used as needed for itching. Do not drive with  this medication if it makes you drowsy.Ask your caregiver about medication for children. SEEK MEDICAL CARE IF:  Open sores develop.  Redness spreads beyond area of rash.  You notice purulent (pus-like) discharge.  You have increased pain.  Other signs of infection develop (such as fever). Document Released: 03/26/2000 Document Revised: 06/21/2011 Document Reviewed: 02/12/2009 [employer] Patient Information 2014 [employer].  

## 2013-09-06 NOTE — Progress Notes (Signed)
Patient seen and discussed with Ms. Leone Payor and agree with the above.    Eula Listen, MHS, PA-C [facility] [address] [place], [place] [address] [phone] [facility] 09/06/2013 7:49 AM

## 2013-12-21 ENCOUNTER — Encounter: Payer: Self-pay | Admitting: Family Medicine

## 2013-12-21 ENCOUNTER — Ambulatory Visit (INDEPENDENT_AMBULATORY_CARE_PROVIDER_SITE_OTHER): Payer: BC Managed Care – PPO | Admitting: Family Medicine

## 2013-12-21 VITALS — BP 130/90 | HR 90 | Temp 98.0°F | Resp 16 | Ht 67.0 in | Wt 252.0 lb

## 2013-12-21 DIAGNOSIS — R1011 Right upper quadrant pain: Secondary | ICD-10-CM

## 2013-12-21 DIAGNOSIS — R05 Cough: Secondary | ICD-10-CM

## 2013-12-21 DIAGNOSIS — R059 Cough, unspecified: Secondary | ICD-10-CM

## 2013-12-21 DIAGNOSIS — J209 Acute bronchitis, unspecified: Secondary | ICD-10-CM

## 2013-12-21 DIAGNOSIS — R0789 Other chest pain: Secondary | ICD-10-CM

## 2013-12-21 LAB — COMPREHENSIVE METABOLIC PANEL
ALBUMIN: 4.2 g/dL (ref 3.5–5.2)
ALT: 19 U/L (ref 0–35)
AST: 14 U/L (ref 0–37)
Alkaline Phosphatase: 85 U/L (ref 39–117)
BUN: 9 mg/dL (ref 6–23)
CALCIUM: 9 mg/dL (ref 8.4–10.5)
CHLORIDE: 105 meq/L (ref 96–112)
CO2: 27 meq/L (ref 19–32)
CREATININE: 0.67 mg/dL (ref 0.50–1.10)
Glucose, Bld: 142 mg/dL — ABNORMAL HIGH (ref 70–99)
POTASSIUM: 4.1 meq/L (ref 3.5–5.3)
Sodium: 138 mEq/L (ref 135–145)
Total Bilirubin: 0.3 mg/dL (ref 0.2–1.2)
Total Protein: 6.6 g/dL (ref 6.0–8.3)

## 2013-12-21 LAB — CBC
HEMATOCRIT: 39 % (ref 36.0–46.0)
Hemoglobin: 12.5 g/dL (ref 12.0–15.0)
MCH: 26.2 pg (ref 26.0–34.0)
MCHC: 32.1 g/dL (ref 30.0–36.0)
MCV: 81.6 fL (ref 78.0–100.0)
PLATELETS: 281 10*3/uL (ref 150–400)
RBC: 4.78 MIL/uL (ref 3.87–5.11)
RDW: 14.7 % (ref 11.5–15.5)
WBC: 8.5 10*3/uL (ref 4.0–10.5)

## 2013-12-21 MED ORDER — ALBUTEROL SULFATE HFA 108 (90 BASE) MCG/ACT IN AERS: 2.0000 | INHALATION_SPRAY | Freq: Four times a day (QID) | RESPIRATORY_TRACT | Status: DC | PRN

## 2013-12-21 MED ORDER — HYDROCOD POLST-CHLORPHEN POLST 10-8 MG/5ML PO LQCR: 5.0000 mL | Freq: Two times a day (BID) | ORAL | Status: DC | PRN

## 2013-12-21 MED ORDER — AZITHROMYCIN 250 MG PO TABS: ORAL_TABLET | ORAL | Status: DC

## 2013-12-21 NOTE — Patient Instructions (Addendum)
Use the azithromycin as directed for bronchitis.  Let me know if you are not getting better soon- Sooner if worse. You may also use the cough syrup as needed but remember this can make you feel sleepy.    We will set you up for an ultrasound to check your gallbladder.  I will also check your labs and be in touch with these results.    I do not think that the bump under your left arm is anything to worry about- it is likely due to an infected hair follicle.  However it also could be a lymph node- if you have not already, you might consider having a baseline mammogram done at your convenience.

## 2013-12-21 NOTE — Progress Notes (Signed)
[facility] [address], [place] [address] [phone]  Date:  12/21/2013   Name:  Sylvia Cantrell   DOB:  [birth year]   MRN:  [id number]  PCP:  Lorretta Harp, MD    Chief Complaint: Sore Throat and Cough   History of Present Illness:  Sylvia Cantrell is a 38 y.o. very pleasant female patient who presents with the following:  Here today with illness.  It "started with a cold," and then got worse about one week ago. She has noted wheezing/ incrase in her asthma sx about 5 days ago. Her sx have waxed and waned but not gone away.   She has noted a fever intermittently, but has not measured her temp.  She has not noted any aches or chills except for about 5 days ago.  She also does have a ST, but thinks this could be due to cough.  She is couing very hard.   She has noted some diarrhea, but this is not new to her.  She does have IBS, and wonders if perhaps she could have some gallstones.  She only coughs up a little bit of mucus at times.   She did use her albuterol about an hour ago.  She could use more of this.   When she is not ill her asthma is generally quiet   She has noted RUQ pain especialy after eating for several months.  She would like to get an ultrasound to see if she has gallstones.    She has used tussionex in the past without any difficulty   LMP 8/28 Patient Active Problem List   Diagnosis Date Noted  . NEPHROLITHIASIS, HX OF 02/15/2010  . MIGRAINE HEADACHE 02/11/2010  . UPPER RESPIRATORY INFECTION, VIRAL 02/11/2010  . ALLERGIC RHINITIS 02/11/2010  . ASTHMA 02/11/2010    Past Medical History  Diagnosis Date  . Allergy   . Depression   . Anemia   . Anxiety   . Asthma     Past Surgical History  Procedure Laterality Date  . Breast surgery      History  Substance Use Topics  . Smoking status: Never Smoker   . Smokeless tobacco: Not on file  . Alcohol Use: Not on file    Family History  Problem Relation Age of Onset  .  Hypertension Mother   . Hyperthyroidism Mother   . Heart failure Father   . Heart failure Maternal Grandmother   . Hypertension Maternal Grandmother   . Heart failure Paternal Grandfather     Allergies  Allergen Reactions  . Amoxicillin     Hives with first dose  . Codeine     REACTION: itching    Medication list has been reviewed and updated.  Current Outpatient Prescriptions on File Prior to Visit  Medication Sig Dispense Refill  . albuterol (PROVENTIL HFA;VENTOLIN HFA) 108 (90 BASE) MCG/ACT inhaler Inhale 2 puffs into the lungs every 6 (six) hours as needed for wheezing or shortness of breath.  1 Inhaler  0  . EPINEPHrine (EPIPEN) 0.3 mg/0.3 mL SOAJ injection Inject 0.3 mLs (0.3 mg total) into the muscle once.  2 Device  2  . fluconazole (DIFLUCAN) 150 MG tablet Take 1 tablet (150 mg total) by mouth once.  2 tablet  3  . LORazepam (ATIVAN) 0.5 MG tablet Take 1/2-1 tab PO BID prn  30 tablet  1  . triamcinolone (NASACORT AQ) 55 MCG/ACT AERO nasal inhaler Place 2 sprays into the nose daily.  1 Inhaler  1   No current facility-administered medications on file prior to visit.    Review of Systems:  As per HPI- otherwise negative.   Physical Examination: Filed Vitals:   12/21/13 0930  BP: 130/90  Pulse: 118  Temp: 98 F (36.7 C)  Resp: 16   Filed Vitals:   12/21/13 0930  Height:  (1.702 m)  Weight: 252 lb (114.306 kg)   Body mass index is 39.46 kg/(m^2). Ideal Body Weight: Weight in (lb) to have BMI = 25: 159.3  GEN: WDWN, NAD, Non-toxic, A & O x 3, obese, looks well HEENT: Atraumatic, Normocephalic. Neck supple. No masses, No LAD.  Bilateral TM wnl, oropharynx normal.  PEERL,EOMI.   Ears and Nose: No external deformity. CV: RRR, No M/G/R. No JVD. No thrill. No extra heart sounds. PULM: CTA B, no wheezes, crackles, rhonchi. No retractions. No resp. distress. No accessory muscle use. ABD: S, NT, ND.Marland Kitchen No rebound. No HSM. EXTR: No c/c/e NEURO Normal gait.   PSYCH: Normally interactive. Conversant. Not depressed or anxious appearing.  Calm demeanor.    Assessment and Plan: Acute bronchitis, unspecified organism - Plan: azithromycin (ZITHROMAX) 250 MG tablet  Chest tightness - Plan: albuterol (PROVENTIL HFA;VENTOLIN HFA) 108 (90 BASE) MCG/ACT inhaler  RUQ pain - Plan: US Abdomen Limited RUQ, CBC, Comprehensive metabolic panel  Cough - Plan: chlorpheniramine-HYDROcodone (TUSSIONEX PENNKINETIC ER) 10-8 MG/5ML LQCR  Treat for bronchitis and cough- see pt instructions Do not drive while on cough syrup  Signed Abbe Amsterdam, MD

## 2013-12-24 ENCOUNTER — Other Ambulatory Visit: Payer: Medicaid Other

## 2013-12-26 ENCOUNTER — Ambulatory Visit
Admission: RE | Admit: 2013-12-26 | Discharge: 2013-12-26 | Disposition: A | Discharge status: Home / Self Care | Payer: BC Managed Care – PPO | Source: Ambulatory Visit | Attending: Family Medicine | Admitting: Family Medicine

## 2013-12-26 DIAGNOSIS — R1011 Right upper quadrant pain: Secondary | ICD-10-CM

## 2013-12-27 ENCOUNTER — Encounter: Payer: Self-pay | Admitting: Family Medicine

## 2013-12-27 ENCOUNTER — Encounter: Payer: Self-pay | Admitting: Gastroenterology

## 2013-12-27 DIAGNOSIS — K589 Irritable bowel syndrome without diarrhea: Secondary | ICD-10-CM

## 2014-02-19 ENCOUNTER — Encounter: Payer: Self-pay | Admitting: Gastroenterology

## 2014-02-26 ENCOUNTER — Encounter: Payer: Self-pay | Admitting: Gastroenterology

## 2014-02-26 ENCOUNTER — Other Ambulatory Visit (INDEPENDENT_AMBULATORY_CARE_PROVIDER_SITE_OTHER): Payer: BC Managed Care – PPO

## 2014-02-26 ENCOUNTER — Ambulatory Visit (INDEPENDENT_AMBULATORY_CARE_PROVIDER_SITE_OTHER): Payer: BC Managed Care – PPO | Admitting: Gastroenterology

## 2014-02-26 VITALS — BP 122/80 | HR 80 | Ht 67.0 in | Wt 250.2 lb

## 2014-02-26 DIAGNOSIS — R1084 Generalized abdominal pain: Secondary | ICD-10-CM

## 2014-02-26 DIAGNOSIS — K5900 Constipation, unspecified: Secondary | ICD-10-CM

## 2014-02-26 DIAGNOSIS — R197 Diarrhea, unspecified: Secondary | ICD-10-CM

## 2014-02-26 DIAGNOSIS — K921 Melena: Secondary | ICD-10-CM

## 2014-02-26 LAB — IGA: IgA: 172 mg/dL (ref 68–378)

## 2014-02-26 LAB — TSH: TSH: 1.39 u[IU]/mL (ref 0.35–4.50)

## 2014-02-26 MED ORDER — MOVIPREP 100 G PO SOLR: 1.0000 | Freq: Once | ORAL | Status: DC

## 2014-02-26 MED ORDER — HYOSCYAMINE SULFATE 0.125 MG SL SUBL: SUBLINGUAL_TABLET | SUBLINGUAL | Status: DC

## 2014-02-26 NOTE — Patient Instructions (Signed)
Your physician has requested that you go to the basement for the following lab work before leaving today: TtG, IgA, TSH  We have sent the following medications to your pharmacy for you to pick up at your convenience: Levsin SL-1-2 tablets under tongue every 4 hours as needed for abdominal pain  You have been scheduled for a colonoscopy. Please follow written instructions given to you at your visit today.  Please pick up your prep kit at the pharmacy within the next 1-3 days. If you use inhalers (even only as needed), please bring them with you on the day of your procedure. Your physician has requested that you go to www.startemmi.com and enter the access code given to you at your visit today. This web site gives a general overview about your procedure. However, you should still follow specific instructions given to you by our office regarding your preparation for the procedure.  We have given you a FODMAP diet to follow.  CC: Wanda Panosh, MD 

## 2014-02-26 NOTE — Progress Notes (Signed)
    History of Present Illness: This is a 38 year old female, referred by Dr. Fabian SharpPanosh, who relates a history of irritable bowel syndrome diagnosed around age 38. She has had chronic problems with alternating diarrhea and constipation associated with lower abdominal pain. This summer she had prolonged episode of diarrhea for several weeks however her bowel pattern returned to its usual alternating pattern. She has occasional small amounts of bright red blood per rectum and occasional heartburn symptoms. Denies weight loss, change in stool caliber, melena, nausea, vomiting, dysphagia, chest pain.  Review of Systems: Pertinent positive and negative review of systems were noted in the above HPI section. All other review of systems were otherwise negative.  Current Medications, Allergies, Past Medical History, Past Surgical History, Family History and Social History were reviewed in [facility] electronic medical record.  Physical Exam: General: Well developed , well nourished, no acute distress Head: Normocephalic and atraumatic Eyes:  sclerae anicteric, EOMI Ears: Normal auditory acuity Mouth: No deformity or lesions Neck: Supple, no masses or thyromegaly Lungs: Clear throughout to auscultation Heart: Regular rate and rhythm; no murmurs, rubs or bruits Abdomen: Soft, mild lower abdominal tenderness without rebound or guarding and non distended. No masses, hepatosplenomegaly or hernias noted. Normal Bowel sounds Rectal: deferred to colonoscopy Musculoskeletal: Symmetrical with no gross deformities  Skin: No lesions on visible extremities Pulses:  Normal pulses noted Extremities: No clubbing, cyanosis, edema or deformities noted Neurological: Alert oriented x 4, grossly nonfocal Cervical Nodes:  No significant cervical adenopathy Inguinal Nodes: No significant inguinal adenopathy Psychological:  Alert and cooperative. Normal mood and affect  Assessment and Recommendations:  1. Presumed  IBS. Rule out IBD, celiac disease, thyroid disorders. Check tTG, IgA and TSH. Trial of hyoscyamine 1-2 every 4 hours as needed and FODMAP diet. The risks, benefits, and alternatives to colonoscopy with possible biopsy and possible polypectomy were discussed with the patient and they consent to proceed.

## 2014-02-27 LAB — TISSUE TRANSGLUTAMINASE, IGA: Tissue Transglutaminase Ab, IgA: 5.8 U/mL (ref <20)

## 2014-03-01 ENCOUNTER — Encounter: Payer: BC Managed Care – PPO | Admitting: Gastroenterology

## 2014-03-11 ENCOUNTER — Encounter: Payer: Self-pay | Admitting: Gastroenterology

## 2014-03-19 ENCOUNTER — Encounter: Payer: BC Managed Care – PPO | Admitting: Gastroenterology

## 2014-03-20 ENCOUNTER — Other Ambulatory Visit: Payer: Self-pay | Admitting: Family Medicine

## 2014-04-19 ENCOUNTER — Encounter: Payer: Self-pay | Admitting: Family Medicine

## 2014-04-19 ENCOUNTER — Ambulatory Visit (INDEPENDENT_AMBULATORY_CARE_PROVIDER_SITE_OTHER): Payer: BLUE CROSS/BLUE SHIELD | Admitting: Family Medicine

## 2014-04-19 VITALS — BP 134/89 | HR 89 | Temp 98.6°F | Resp 16 | Ht 66.75 in | Wt 246.2 lb

## 2014-04-19 DIAGNOSIS — R21 Rash and other nonspecific skin eruption: Secondary | ICD-10-CM

## 2014-04-19 DIAGNOSIS — F411 Generalized anxiety disorder: Secondary | ICD-10-CM

## 2014-04-19 DIAGNOSIS — Z23 Encounter for immunization: Secondary | ICD-10-CM

## 2014-04-19 DIAGNOSIS — K589 Irritable bowel syndrome without diarrhea: Secondary | ICD-10-CM

## 2014-04-19 MED ORDER — LORAZEPAM 0.5 MG PO TABS: ORAL_TABLET | ORAL | Status: DC

## 2014-04-19 NOTE — Progress Notes (Signed)
Subjective:    Patient ID: Sylvia Cantrell, female    DOB: 1976-03-26, 39 y.o.   MRN: [id number]  PCP: Lorretta Harp, MD  Chief Complaint  Patient presents with  . Medication Refill  . Anxiety    panick attacks are getting worse in the last two weeks  . Rash    blisters on both hands in between fingers and all over hands feels like it might be due to stress   Patient Active Problem List   Diagnosis Date Noted  . NEPHROLITHIASIS, HX OF 02/15/2010  . MIGRAINE HEADACHE 02/11/2010  . UPPER RESPIRATORY INFECTION, VIRAL 02/11/2010  . ALLERGIC RHINITIS 02/11/2010  . ASTHMA 02/11/2010   Prior to Admission medications   Medication Sig Start Date End Date Taking? Authorizing Provider  albuterol (PROVENTIL HFA;VENTOLIN HFA) 108 (90 BASE) MCG/ACT inhaler Inhale 2 puffs into the lungs every 6 (six) hours as needed for wheezing or shortness of breath. 12/21/13  Yes Gwenlyn Found Copland, MD  cetirizine (ZYRTEC) 10 MG tablet Take 10 mg by mouth daily.   Yes Historical Provider, MD  EPINEPHrine (EPIPEN) 0.3 mg/0.3 mL SOAJ injection Inject 0.3 mLs (0.3 mg total) into the muscle once. 03/12/13  Yes Peyton Najjar, MD  hyoscyamine (LEVSIN/SL) 0.125 MG SL tablet Take 1-2 tablets by mouth every 4 hours as needed for abdominal pain 02/26/14  Yes Meryl Dare, MD  LORazepam (ATIVAN) 0.5 MG tablet Take 1/2-1 tab PO BID prn 03/12/13  Yes Thao P Le, DO  ranitidine (ZANTAC) 150 MG tablet Take 150 mg by mouth as needed for heartburn.   Yes Historical Provider, MD  triamcinolone (NASACORT AQ) 55 MCG/ACT AERO nasal inhaler Place 2 sprays into the nose daily. 06/04/13  Yes Morrell Riddle, PA-C   Medications, allergies, past medical history, surgical history, family history, social history and problem list reviewed and updated.  HPI  67 yof with pmh anxiety/panic, ibs c/d, gerd, and mild intermittent asthma returns to clinic for med refill and f/u.   Has been doing well since last visit. She has had slightly  more anxiety and had one severe panic attack over the holidays. She feels her worsening sx have been due to all the stress over the holidays. She couldn't sleep for few days due to anxiety so took time off work. She has also had some nausea with her panic which is normal for her.   She feels her ativan continues to work well. Taking 1/2 pill approx 3 nights week. She will rarely take one during the day if she has panic. She does not feel that her anxiety occurs most days of the week. She does not feel that it affects her day to day life. She has tried lexapro 10 yrs ago and didn't like how it made her feel too relaxed. She tried counseling for awhile around her dad's death 10 yrs ago. She is happy with her current regimen.   She saw GI 11/15 for IBS. Prescribed hyoscyamine. She has taken this 3-4 times and thinks it worked ok. She recently changed her diet to healthier options. She is avoiding foods that have upset her in past though she notes bothersome foods change for her. GI wanted a colonoscopy for rectal bleeding but pt declined due to cost and stress around the holidays. She is not concerned as she feels it is her hemorrhoids causing the bleeding. The blood is typically on the paper when she wipes and has eased off a bit since the holidays. No  painful defecation.   She mentions some small blisters/itching over right index finger and left palm. Has tried otc hydrocortisone which made it worse. Moisturizers and coconut oil have helped.   Review of Systems No CP, SOB, fever, chills, night sweats, dysuria.     Objective:   Physical Exam  Constitutional: She is oriented to person, place, and time. She appears well-developed and well-nourished.  Non-toxic appearance. She does not have a sickly appearance. She does not appear ill. No distress.  BP 134/89 mmHg  Pulse 89  Temp(Src) 98.6 F (37 C) (Oral)  Resp 16  Ht 5' 6.75" (1.695 m)  Wt 246 lb 3.2 oz (111.676 kg)  BMI 38.87 kg/m2  SpO2 98%   LMP 04/08/2014   Cardiovascular: Normal rate, regular rhythm and normal heart sounds.  Exam reveals no gallop.   No murmur heard. Pulmonary/Chest: Effort normal and breath sounds normal. No tachypnea. She has no decreased breath sounds. She has no wheezes. She has no rhonchi. She has no rales.  Neurological: She is alert and oriented to person, place, and time.  Skin:  Two small papular rashes right 2nd digit at lateral aspect. Each approx 1-2 mm size, not crusting, no surrounding erythema. No purluence. One small papule on left palm.   Psychiatric: She has a normal mood and affect. Her speech is normal.      Assessment & Plan:   [age] yof with pmh anxiety/panic, ibs c/d, gerd, and mild intermittent asthma returns to clinic for med refill and f/u.   Need for prophylactic vaccination and inoculation against influenza - Plan: Flu Vaccine QUAD 36+ mos IM  Need for prophylactic vaccination with combined diphtheria-tetanus-pertussis (DTP) vaccine - Plan: Tdap vaccine greater than or equal to 7yo IM  IBS (irritable bowel syndrome) --well controlled --seeing GI  Rash and nonspecific skin eruption --most likely eczema, continue emollient cream --rtc if not improving  Anxiety state - Plan: LORazepam (ATIVAN) 0.5 MG tablet --ativan refilled --rtc as needed and for refill --continue to use stress management techniques  Donnajean Lopesodd M. Vonna Brabson, PA-C Physician Assistant-Certified [facility]  04/19/2014 12:58 PM   Agree with A/P Dr Conley RollsLe 04/22/2014

## 2014-04-19 NOTE — Patient Instructions (Addendum)
We've refilled your xanax 30 pills with one refill. It sounds like this is working well so please continue with it as you have been. For the rash, please continue to use your moisturizer daily as you have been for 2-3 weeks. Use the coconut oil as well. This should help to clear it up and please let us know if it does not.

## 2014-07-15 ENCOUNTER — Ambulatory Visit (INDEPENDENT_AMBULATORY_CARE_PROVIDER_SITE_OTHER): Payer: BLUE CROSS/BLUE SHIELD | Admitting: Physician Assistant

## 2014-07-15 VITALS — BP 148/80 | HR 78 | Temp 97.9°F | Resp 16 | Ht 66.25 in | Wt 249.8 lb

## 2014-07-15 DIAGNOSIS — R42 Dizziness and giddiness: Secondary | ICD-10-CM | POA: Diagnosis not present

## 2014-07-15 DIAGNOSIS — R0602 Shortness of breath: Secondary | ICD-10-CM

## 2014-07-15 DIAGNOSIS — J4541 Moderate persistent asthma with (acute) exacerbation: Secondary | ICD-10-CM | POA: Diagnosis not present

## 2014-07-15 DIAGNOSIS — R0981 Nasal congestion: Secondary | ICD-10-CM | POA: Diagnosis not present

## 2014-07-15 LAB — POCT CBC
GRANULOCYTE PERCENT: 62.9 % (ref 37–80)
HCT, POC: 37.7 % (ref 37.7–47.9)
Hemoglobin: 11.9 g/dL — AB (ref 12.2–16.2)
LYMPH, POC: 2.2 (ref 0.6–3.4)
MCH, POC: 25.4 pg — AB (ref 27–31.2)
MCHC: 31.6 g/dL — AB (ref 31.8–35.4)
MCV: 80.4 fL (ref 80–97)
MID (CBC): 0.6 (ref 0–0.9)
MPV: 7.6 fL (ref 0–99.8)
PLATELET COUNT, POC: 276 10*3/uL (ref 142–424)
POC Granulocyte: 4.7 (ref 2–6.9)
POC LYMPH %: 29.6 % (ref 10–50)
POC MID %: 7.5 %M (ref 0–12)
RBC: 4.69 M/uL (ref 4.04–5.48)
RDW, POC: 14.2 %
WBC: 7.5 10*3/uL (ref 4.6–10.2)

## 2014-07-15 MED ORDER — ALBUTEROL SULFATE (2.5 MG/3ML) 0.083% IN NEBU
2.5000 mg | INHALATION_SOLUTION | Freq: Once | RESPIRATORY_TRACT | Status: AC
  Administered 2014-07-15: 2.5 mg via RESPIRATORY_TRACT

## 2014-07-15 MED ORDER — MUCINEX DM MAXIMUM STRENGTH 60-1200 MG PO TB12: 1.0000 | ORAL_TABLET | Freq: Two times a day (BID) | ORAL | Status: AC

## 2014-07-15 MED ORDER — IPRATROPIUM BROMIDE 0.03 % NA SOLN: 2.0000 | Freq: Two times a day (BID) | NASAL | Status: DC

## 2014-07-15 MED ORDER — BECLOMETHASONE DIPROPIONATE 40 MCG/ACT IN AERS: 2.0000 | INHALATION_SPRAY | Freq: Two times a day (BID) | RESPIRATORY_TRACT | Status: DC

## 2014-07-15 NOTE — Patient Instructions (Signed)
Drink plenty of water. Use the preventative, twice per day, and the albuterol for rescue. If you continue to have difficulty with your airways, or with breathing, or nausea, please contact me.

## 2014-07-15 NOTE — Progress Notes (Signed)
[facility] [address] [place] [address] [phone]  Date:  07/15/2014   Name:  Sylvia Cantrell   DOB:  1976-03-01   MRN:  [id number]  PCP:  Lorretta Harp, MD    Chief Complaint: Asthma; Allergies; Nausea; Shortness of Breath; Nasal Congestion; and Dizziness   History of Present Illness:  Sylvia Cantrell is a 39 y.o. very pleasant female patient who presents with the following:  Patient symptoms began 1 week ago with sore throat, congestion that has progressively worsened to congestion dizziness and nausea.  She has ear fullness on her right side.  Her eyes are watering on her right side.  She reports some fever of 99.8 about 3 days ago.  She denies any generalized muscle aches.  The nausea appears at rest or exertion.  She has used her albuterol about once per day in the last week, but yesterday she became concerned when she had to use it about 3 times.  She has non-productive cough.  She has had her flu vaccine.  She has diarrhea, but states that this occurs with her IBS.  She denies heartburn, regurgitation, chest pains, or diaphoresis.       Patient Active Problem List   Diagnosis Date Noted  . NEPHROLITHIASIS, HX OF 02/15/2010  . MIGRAINE HEADACHE 02/11/2010  . UPPER RESPIRATORY INFECTION, VIRAL 02/11/2010  . ALLERGIC RHINITIS 02/11/2010  . ASTHMA 02/11/2010    Past Medical History  Diagnosis Date  . Allergy   . Depression   . Anemia   . Anxiety   . Asthma   . Nephrolithiasis   . Anemia   . Chronic headache   . Hypertension   . IBS (irritable bowel syndrome)   . Kidney stones   . Obesity   . Seizures     Past Surgical History  Procedure Laterality Date  . Breast surgery    . Tonsillectomy  1987    History  Substance Use Topics  . Smoking status: Never Smoker   . Smokeless tobacco: Not on file  . Alcohol Use: No    Family History  Problem Relation Age of Onset  . Hypertension Mother   . Hyperthyroidism Mother   .  Heart failure Father   . Heart failure Maternal Grandmother   . Hypertension Maternal Grandmother   . Heart failure Paternal Grandfather   . Kidney disease Maternal Grandmother     Allergies  Allergen Reactions  . Amoxicillin     Hives with first dose  . Codeine     REACTION: itching    Medication list has been reviewed and updated.  Current Outpatient Prescriptions on File Prior to Visit  Medication Sig Dispense Refill  . albuterol (PROVENTIL HFA;VENTOLIN HFA) 108 (90 BASE) MCG/ACT inhaler Inhale 2 puffs into the lungs every 6 (six) hours as needed for wheezing or shortness of breath. 1 Inhaler 2  . cetirizine (ZYRTEC) 10 MG tablet Take 10 mg by mouth daily.    Marland Kitchen EPINEPHrine (EPIPEN) 0.3 mg/0.3 mL SOAJ injection Inject 0.3 mLs (0.3 mg total) into the muscle once. 2 Device 2  . hyoscyamine (LEVSIN/SL) 0.125 MG SL tablet Take 1-2 tablets by mouth every 4 hours as needed for abdominal pain 50 tablet 3  . LORazepam (ATIVAN) 0.5 MG tablet Take 1/2-1 tab PO BID prn 30 tablet 1  . ranitidine (ZANTAC) 150 MG tablet Take 150 mg by mouth as needed for heartburn.    . triamcinolone (NASACORT AQ) 55 MCG/ACT AERO nasal  inhaler Place 2 sprays into the nose daily. 1 Inhaler 1   No current facility-administered medications on file prior to visit.    Review of Systems: ROS otherwise unremarkable unless listed above.   Physical Examination: Filed Vitals:   07/15/14 0814  BP: 148/80  Pulse: 78  Temp: 97.9 F (36.6 C)  Resp: 16   Filed Vitals:   07/15/14 0814  Height: 5' 6.25" (1.683 m)  Weight: 249 lb 12.8 oz (113.309 kg)   Body mass index is 40 kg/(m^2). Ideal Body Weight: Weight in (lb) to have BMI = 25: 155.7  Results for orders placed or performed in visit on 07/15/14  POCT CBC  Result Value Ref Range   WBC 7.5 4.6 - 10.2 K/uL   Lymph, poc 2.2 0.6 - 3.4   POC LYMPH PERCENT 29.6 10 - 50 %L   MID (cbc) 0.6 0 - 0.9   POC MID % 7.5 0 - 12 %M   POC Granulocyte 4.7 2 - 6.9    Granulocyte percent 62.9 37 - 80 %G   RBC 4.69 4.04 - 5.48 M/uL   Hemoglobin 11.9 (A) 12.2 - 16.2 g/dL   HCT, POC 16.137.7 09.637.7 - 47.9 %   MCV 80.4 80 - 97 fL   MCH, POC 25.4 (A) 27 - 31.2 pg   MCHC 31.6 (A) 31.8 - 35.4 g/dL   RDW, POC 04.514.2 %   Platelet Count, POC 276 142 - 424 K/uL   MPV 7.6 0 - 99.8 fL   Physical Exam  Constitutional: She is oriented to person, place, and time. She appears well-developed and well-nourished. No distress.  HENT:  Head: Normocephalic and atraumatic.  Right Ear: External ear and ear canal normal.  Left Ear: External ear and ear canal normal.  Nose: Mucosal edema and rhinorrhea present.  Mouth/Throat: No oropharyngeal exudate, posterior oropharyngeal edema or posterior oropharyngeal erythema.  Left TM is slightly bulging with no obvious effusion, injection or erythema.  Right TM appears normal.    Eyes: Conjunctivae are normal. Pupils are equal, round, and reactive to light. Right conjunctiva is not injected. Right conjunctiva has no hemorrhage. Left conjunctiva is not injected. Left conjunctiva has no hemorrhage. Right eye exhibits no nystagmus. Left eye exhibits no nystagmus.  Cardiovascular: Normal rate, regular rhythm and normal heart sounds.  Exam reveals no gallop, no distant heart sounds and no friction rub.   No murmur heard. Pulses:      Dorsalis pedis pulses are 2+ on the right side, and 2+ on the left side.  Pulmonary/Chest: Effort normal. No accessory muscle usage. No apnea and no tachypnea. No respiratory distress. She has no wheezes.  Decreased breath sounds with expiration.  No wheezing, rales, or rhonchi.   Normal capillary refill.  Abdominal: Soft. Bowel sounds are normal. She exhibits no distension. There is tenderness in the left lower quadrant.  Neurological: She is alert and oriented to person, place, and time. No cranial nerve deficit. Gait normal.  Psychiatric: She has a normal mood and affect. Her speech is normal and behavior is  normal.   Pre nebulizing peak flow= 300 predicted value is 489= 61% Post nebulizing peak flow=450  Assessment and Plan: 39 year old female is here today for sore throat, nasal congestion, nausea, dizziness, and dyspnea.  I am more suspicious that this is her allergies that are causing the asthma exacerbation.  The dizziness appears secondary to the asthma, and lack of oxygenation.  I am advising a preventative qvar.  The nebulizing treatment appears to have helped her symptoms.   If she continues to have fever, productive cough, will consider starting antibiotic (Z-PAK).    Dizziness - Plan: POCT CBC  SOB (shortness of breath) - Plan: albuterol (PROVENTIL) (2.5 MG/3ML) 0.083% nebulizer solution 2.5 mg  Asthma with exacerbation, moderate persistent - Plan: beclomethasone (QVAR) 40 MCG/ACT inhaler  Nasal congestion - Plan: ipratropium (ATROVENT) 0.03 % nasal spray, Dextromethorphan-Guaifenesin (MUCINEX DM MAXIMUM STRENGTH) 60-1200 MG TB12  Trena Platt, PA-C [facility] 4/4/20165:52 PM

## 2014-11-18 ENCOUNTER — Ambulatory Visit (INDEPENDENT_AMBULATORY_CARE_PROVIDER_SITE_OTHER): Payer: BLUE CROSS/BLUE SHIELD | Admitting: Urgent Care

## 2014-11-18 VITALS — BP 126/80 | HR 90 | Temp 98.3°F | Resp 16 | Ht 66.25 in | Wt 246.0 lb

## 2014-11-18 DIAGNOSIS — R300 Dysuria: Secondary | ICD-10-CM

## 2014-11-18 DIAGNOSIS — N3090 Cystitis, unspecified without hematuria: Secondary | ICD-10-CM

## 2014-11-18 LAB — POCT UA - MICROSCOPIC ONLY
Casts, Ur, LPF, POC: NEGATIVE
Crystals, Ur, HPF, POC: NEGATIVE
Mucus, UA: NEGATIVE
Yeast, UA: NEGATIVE

## 2014-11-18 LAB — POCT URINALYSIS DIPSTICK
BILIRUBIN UA: NEGATIVE
Glucose, UA: NEGATIVE
Ketones, UA: NEGATIVE
LEUKOCYTES UA: NEGATIVE
Nitrite, UA: POSITIVE
PH UA: 6
PROTEIN UA: NEGATIVE
Spec Grav, UA: <=1.005
UROBILINOGEN UA: 0.2

## 2014-11-18 MED ORDER — NITROFURANTOIN MONOHYD MACRO 100 MG PO CAPS: 100.0000 mg | ORAL_CAPSULE | Freq: Two times a day (BID) | ORAL | Status: DC

## 2014-11-18 NOTE — Progress Notes (Signed)
    MRN: [id number] DOB: 04-Dec-1975  Subjective:   Sylvia Cantrell is a 39 y.o. female presenting for chief complaint of Urinary Tract Infection; Diarrhea; and Fever  Reports 3 day history of worsening dysuria, urinary frequency, lower pelvic/abdominal pain, right sided flank pain, fever (highest 100.1), chills. Has tried Azo with some relief. Has not been able to drink or eat as much due to nausea and diarrhea from ongoing IBS. Reports history of kidney stone, this episode feels different. Denies n/v, genital rashes, abnormal vaginal discharge. Denies any other aggravating or relieving factors, no other questions or concerns.  Sylvia Cantrell has a current medication list which includes the following prescription(s): albuterol, beclomethasone, cetirizine, epinephrine, lorazepam, and ranitidine. She is allergic to amoxicillin and codeine.  Sylvia Cantrell  has a past medical history of Allergy; Depression; Anemia; Anxiety; Asthma; Nephrolithiasis; Anemia; Chronic headache; Hypertension; IBS (irritable bowel syndrome); Kidney stones; Obesity; and Seizures. Also  has past surgical history that includes Breast surgery and Tonsillectomy (1987).  ROS As in subjective.  Objective:   Vitals: BP 126/80 mmHg  Pulse 90  Temp(Src) 98.3 F (36.8 C) (Oral)  Resp 16  Ht 5' 6.25" (1.683 m)  Wt 246 lb (111.585 kg)  BMI 39.39 kg/m2  SpO2 98%  Physical Exam  Constitutional: She is oriented to person, place, and time. She appears well-developed and well-nourished.  Cardiovascular: Normal rate, regular rhythm and intact distal pulses.  Exam reveals no gallop and no friction rub.   No murmur heard. Pulmonary/Chest: No respiratory distress. She has no wheezes. She has no rales.  Abdominal: Soft. Bowel sounds are normal. She exhibits no distension and no mass. There is no tenderness.  No CVA tenderness.  Musculoskeletal: She exhibits no edema.  Neurological: She is alert and oriented to person, place, and time.  Skin:  Skin is warm and dry. No rash noted. No erythema. No pallor.   Results for orders placed or performed in visit on 11/18/14 (from the past 24 hour(s))  POCT UA - Microscopic Only     Status: None   Collection Time: 11/18/14 11:09 AM  Result Value Ref Range   WBC, Ur, HPF, POC 0-1    RBC, urine, microscopic 4-13    Bacteria, U Microscopic trace    Mucus, UA neg    Epithelial cells, urine per micros 2-4    Crystals, Ur, HPF, POC neg    Casts, Ur, LPF, POC neg    Yeast, UA neg   POCT urinalysis dipstick     Status: None   Collection Time: 11/18/14 11:09 AM  Result Value Ref Range   Color, UA orange    Clarity, UA clear    Glucose, UA neg    Bilirubin, UA neg    Ketones, UA neg    Spec Grav, UA <=1.005    Blood, UA mod    pH, UA 6.0    Protein, UA neg    Urobilinogen, UA 0.2    Nitrite, UA pos    Leukocytes, UA Negative Negative    Assessment and Plan :   1. Dysuria 2. Cystitis - Urine culture pending, start Macrobid for UTI. RTC if symptoms of pyelo develop as discussed in clinic.   Wallis Bamberg, PA-C [facility] [phone] 11/18/2014 11:17 AM

## 2014-11-18 NOTE — Patient Instructions (Signed)

## 2014-11-19 ENCOUNTER — Ambulatory Visit (INDEPENDENT_AMBULATORY_CARE_PROVIDER_SITE_OTHER): Payer: BLUE CROSS/BLUE SHIELD | Admitting: Urgent Care

## 2014-11-19 VITALS — BP 140/90 | HR 145 | Temp 99.2°F | Ht 66.25 in | Wt 248.2 lb

## 2014-11-19 DIAGNOSIS — R110 Nausea: Secondary | ICD-10-CM | POA: Diagnosis not present

## 2014-11-19 DIAGNOSIS — R1010 Upper abdominal pain, unspecified: Secondary | ICD-10-CM

## 2014-11-19 DIAGNOSIS — N3090 Cystitis, unspecified without hematuria: Secondary | ICD-10-CM | POA: Diagnosis not present

## 2014-11-19 DIAGNOSIS — N10 Acute tubulo-interstitial nephritis: Secondary | ICD-10-CM | POA: Diagnosis not present

## 2014-11-19 DIAGNOSIS — R109 Unspecified abdominal pain: Secondary | ICD-10-CM

## 2014-11-19 LAB — POCT CBC
Granulocyte percent: 79.3 %G (ref 37–80)
HCT, POC: 39 % (ref 37.7–47.9)
HEMOGLOBIN: 11.7 g/dL — AB (ref 12.2–16.2)
LYMPH, POC: 1.2 (ref 0.6–3.4)
MCH, POC: 23.8 pg — AB (ref 27–31.2)
MCHC: 30 g/dL — AB (ref 31.8–35.4)
MCV: 79.3 fL — AB (ref 80–97)
MID (cbc): 0.7 (ref 0–0.9)
MPV: 7.5 fL (ref 0–99.8)
PLATELET COUNT, POC: 271 10*3/uL (ref 142–424)
POC Granulocyte: 7.1 — AB (ref 2–6.9)
POC LYMPH PERCENT: 13 %L (ref 10–50)
POC MID %: 7.7 % (ref 0–12)
RBC: 4.92 M/uL (ref 4.04–5.48)
RDW, POC: 16.4 %
WBC: 9 10*3/uL (ref 4.6–10.2)

## 2014-11-19 LAB — URINE CULTURE

## 2014-11-19 MED ORDER — OXYCODONE-ACETAMINOPHEN 5-325 MG PO TABS: 1.0000 | ORAL_TABLET | Freq: Three times a day (TID) | ORAL | Status: DC | PRN

## 2014-11-19 MED ORDER — ONDANSETRON HCL 4 MG/2ML IJ SOLN: 2.0000 mg | Freq: Once | INTRAMUSCULAR | Status: DC

## 2014-11-19 MED ORDER — ONDANSETRON HCL 4 MG/2ML IJ SOLN
4.0000 mg | Freq: Once | INTRAMUSCULAR | Status: AC
  Administered 2014-11-19: 4 mg via INTRAMUSCULAR

## 2014-11-19 MED ORDER — ONDANSETRON 8 MG PO TBDP: 8.0000 mg | ORAL_TABLET | Freq: Three times a day (TID) | ORAL | Status: DC | PRN

## 2014-11-19 MED ORDER — CIPROFLOXACIN HCL 500 MG PO TABS: 500.0000 mg | ORAL_TABLET | Freq: Two times a day (BID) | ORAL | Status: DC

## 2014-11-19 NOTE — Progress Notes (Signed)
    MRN: [id number] DOB: [birth year]  Subjective:   Sylvia Cantrell is a 39 y.o. female presenting for follow up on cystitis. Patient was seen yesterday, diagnosed with UTI, started on Macrobid. Since then she has developed fever (highest 102F), worsening nausea, bilateral flank pain. Has tried ibuprofen with some relief. Reports rash with Amoxicillin, she cannot recall rash specifically with codeine but admits having a lot of itching. Denies any other aggravating or relieving factors, no other questions or concerns.  Sylvia Cantrell has a current medication list which includes the following prescription(s): albuterol, beclomethasone, cetirizine, epinephrine, ibuprofen, lorazepam, nitrofurantoin (macrocrystal-monohydrate), and ranitidine. She is allergic to amoxicillin and codeine.  Sylvia Cantrell  has a past medical history of Allergy; Depression; Anemia; Anxiety; Asthma; Nephrolithiasis; Anemia; Chronic headache; Hypertension; IBS (irritable bowel syndrome); Kidney stones; Obesity; and Seizures. Also  has past surgical history that includes Breast surgery and Tonsillectomy (1987).  ROS As in subjective.  Objective:   Vitals: BP 140/90 mmHg  Pulse 145  Temp(Src) 99.2 F (37.3 C) (Oral)  Ht 5' 6.25" (1.683 m)  Wt 248 lb 4 oz (112.605 kg)  BMI 39.75 kg/m2  SpO2 99%  Physical Exam  Constitutional: She is oriented to person, place, and time. She appears distressed.  Pulmonary/Chest: No respiratory distress. She has no wheezes. She has no rales.  Abdominal: Soft. Bowel sounds are normal. She exhibits no distension and no mass. There is tenderness (bilateral CVA tenderness).  Neurological: She is alert and oriented to person, place, and time.  Skin: Skin is warm and dry. No rash noted. She is not diaphoretic. No erythema. No pallor.  Psychiatric: She has a normal mood and affect.   Results for orders placed or performed in visit on 11/19/14 (from the past 24 hour(s))  POCT CBC     Status: Abnormal   Collection Time: 11/19/14  7:41 PM  Result Value Ref Range   WBC 9.0 4.6 - 10.2 K/uL   Lymph, poc 1.2 0.6 - 3.4   POC LYMPH PERCENT 13.0 10 - 50 %L   MID (cbc) 0.7 0 - 0.9   POC MID % 7.7 0 - 12 %M   POC Granulocyte 7.1 (A) 2 - 6.9   Granulocyte percent 79.3 37 - 80 %G   RBC 4.92 4.04 - 5.48 M/uL   Hemoglobin 11.7 (A) 12.2 - 16.2 g/dL   HCT, POC 62.1 30.8 - 47.9 %   MCV 79.3 (A) 80 - 97 fL   MCH, POC 23.8 (A) 27 - 31.2 pg   MCHC 30.0 (A) 31.8 - 35.4 g/dL   RDW, POC 65.7 %   Platelet Count, POC 271 142 - 424 K/uL   MPV 7.5 0 - 99.8 fL    Assessment and Plan :   1. Acute pyelonephritis 2. Flank pain, acute 3. Nausea without vomiting 4. Cystitis - CBC reassuring, stop Macrobid, switch to Cipro x10 days, patient is to call me or Dr. Patsy Lager tomorrow for recheck. Percocet for pain, she is to stop this immediately if she does develop rash. Zofran for nausea. Consider kidney stone if no improvement, referral to ED.  Wallis Bamberg, PA-C [facility] [phone] 11/19/2014 7:05 PM

## 2014-11-19 NOTE — Patient Instructions (Signed)
Pyelonephritis, Adult °Pyelonephritis is a kidney infection. In general, there are 2 main types of pyelonephritis: °· Infections that come on quickly without any warning (acute pyelonephritis). °· Infections that persist for a long period of time (chronic pyelonephritis). °CAUSES  °Two main causes of pyelonephritis are: °· Bacteria traveling from the bladder to the kidney. This is a problem especially in pregnant women. The urine in the bladder can become filled with bacteria from multiple causes, including: °¨ Inflammation of the prostate gland (prostatitis). °¨ Sexual intercourse in females. °¨ Bladder infection (cystitis). °· Bacteria traveling from the bloodstream to the tissue part of the kidney. °Problems that may increase your risk of getting a kidney infection include: °· Diabetes. °· Kidney stones or bladder stones. °· Cancer. °· Catheters placed in the bladder. °· Other abnormalities of the kidney or ureter. °SYMPTOMS  °· Abdominal pain. °· Pain in the side or flank area. °· Fever. °· Chills. °· Upset stomach. °· Blood in the urine (dark urine). °· Frequent urination. °· Strong or persistent urge to urinate. °· Burning or stinging when urinating. °DIAGNOSIS  °Your caregiver may diagnose your kidney infection based on your symptoms. A urine sample may also be taken. °TREATMENT  °In general, treatment depends on how severe the infection is.  °· If the infection is mild and caught early, your caregiver may treat you with oral antibiotics and send you home. °· If the infection is more severe, the bacteria may have gotten into the bloodstream. This will require intravenous (IV) antibiotics and a hospital stay. Symptoms may include: °¨ High fever. °¨ Severe flank pain. °¨ Shaking chills. °· Even after a hospital stay, your caregiver may require you to be on oral antibiotics for a period of time. °· Other treatments may be required depending upon the cause of the infection. °HOME CARE INSTRUCTIONS  °· Take your  antibiotics as directed. Finish them even if you start to feel better. °· Make an appointment to have your urine checked to make sure the infection is gone. °· Drink enough fluids to keep your urine clear or pale yellow. °· Take medicines for the bladder if you have urgency and frequency of urination as directed by your caregiver. °SEEK IMMEDIATE MEDICAL CARE IF:  °· You have a fever or persistent symptoms for more than 2-3 days. °· You have a fever and your symptoms suddenly get worse. °· You are unable to take your antibiotics or fluids. °· You develop shaking chills. °· You experience extreme weakness or fainting. °· There is no improvement after 2 days of treatment. °MAKE SURE YOU: °· Understand these instructions. °· Will watch your condition. °· Will get help right away if you are not doing well or get worse. °Document Released: 03/29/2005 Document Revised: 09/28/2011 Document Reviewed: 09/02/2010 °ExitCare® Patient Information ©2015 [employer]. This information is not intended to replace advice given to you by your health care provider. Make sure you discuss any questions you have with your health care provider. ° °

## 2014-11-20 ENCOUNTER — Telehealth: Payer: Self-pay

## 2014-11-20 NOTE — Addendum Note (Signed)
Addended by: Wallis Bamberg on: 11/20/2014 10:50 AM   Modules accepted: Kipp Brood

## 2014-11-20 NOTE — Addendum Note (Signed)
Addended by: Wallis Bamberg on: 11/20/2014 10:46 AM   Modules accepted: Kipp Brood

## 2014-11-20 NOTE — Telephone Encounter (Signed)
PATIENT STATES SHE SAW MARIO MANI ON Tuesday FOR A UTI THAT HAD MOVED TO HER KIDNEYS. HE TOLD HER TO REPORT BACK TODAY ON HOW SHE WAS FEELING. SHE WANTS HIM TO KNOW THAT SHE IS MUCH BETTER TODAY. SHE IS NOT IN AS MUCH PAIN IN HER BACK AND THE ANTIBIOTIC SEEMS TO BE HELPING. BEST PHONE [phone] (CELL) MBC

## 2014-11-21 NOTE — Telephone Encounter (Signed)
Rod Can with her yesterday. She is to complete antibiotic course.

## 2014-12-19 LAB — HM PAP SMEAR: HM Pap smear: NEGATIVE

## 2015-03-11 ENCOUNTER — Ambulatory Visit (INDEPENDENT_AMBULATORY_CARE_PROVIDER_SITE_OTHER): Payer: BLUE CROSS/BLUE SHIELD | Admitting: Emergency Medicine

## 2015-03-11 VITALS — BP 142/86 | HR 102 | Temp 98.6°F | Resp 16 | Ht 66.25 in | Wt 248.0 lb

## 2015-03-11 DIAGNOSIS — J4531 Mild persistent asthma with (acute) exacerbation: Secondary | ICD-10-CM | POA: Diagnosis not present

## 2015-03-11 MED ORDER — IPRATROPIUM BROMIDE 0.02 % IN SOLN
0.5000 mg | Freq: Once | RESPIRATORY_TRACT | Status: AC
  Administered 2015-03-11: 0.5 mg via RESPIRATORY_TRACT

## 2015-03-11 MED ORDER — ALBUTEROL SULFATE (2.5 MG/3ML) 0.083% IN NEBU
5.0000 mg | INHALATION_SOLUTION | Freq: Once | RESPIRATORY_TRACT | Status: AC
  Administered 2015-03-11: 5 mg via RESPIRATORY_TRACT

## 2015-03-11 MED ORDER — PREDNISONE 10 MG (21) PO TBPK: ORAL_TABLET | ORAL | Status: DC

## 2015-03-11 NOTE — Progress Notes (Signed)
Subjective:  Patient ID: Sylvia Cantrell, female    DOB: [birth year]  Age: [age] y.o. MRN: [id number]  CC: Asthma   HPI Sylvia Cantrell presents    she has a history of asthma area she been using her inhaler more often than normal but still has wheezing and shortness of breath. She has exertional shortness of breath. She has  A cough with no sputum production. She has no nasal congestion postnasal drainage sore throat pain there is a pressure in her sinuses. She has no fever chills  History Sylvia Cantrell has a past medical history of Allergy; Depression; Anemia; Anxiety; Asthma; Nephrolithiasis; Anemia; Chronic headache; Hypertension; IBS (irritable bowel syndrome); Kidney stones; Obesity; and Seizures (HCC).   She has past surgical history that includes Breast surgery and Tonsillectomy (1987).   Her  family history includes Heart failure in her father, maternal grandmother, and paternal grandfather; Hypertension in her maternal grandmother and mother; Hyperthyroidism in her mother; Kidney disease in her maternal grandmother.  She   reports that she has never smoked. She does not have any smokeless tobacco history on file. She reports that she does not drink alcohol or use illicit drugs.  Outpatient Prescriptions Prior to Visit  Medication Sig Dispense Refill  . albuterol (PROVENTIL HFA;VENTOLIN HFA) 108 (90 BASE) MCG/ACT inhaler Inhale 2 puffs into the lungs every 6 (six) hours as needed for wheezing or shortness of breath. 1 Inhaler 2  . beclomethasone (QVAR) 40 MCG/ACT inhaler Inhale 2 puffs into the lungs 2 (two) times daily. 1 Inhaler 4  . cetirizine (ZYRTEC) 10 MG tablet Take 10 mg by mouth daily.    Marland Kitchen. EPINEPHrine (EPIPEN) 0.3 mg/0.3 mL SOAJ injection Inject 0.3 mLs (0.3 mg total) into the muscle once. 2 Device 2  . ibuprofen (ADVIL,MOTRIN) 200 MG tablet Take 200 mg by mouth every 6 (six) hours as needed.    Marland Kitchen. LORazepam (ATIVAN) 0.5 MG tablet Take 1/2-1 tab PO BID prn 30 tablet 1  .  ranitidine (ZANTAC) 150 MG tablet Take 150 mg by mouth as needed for heartburn.    . ciprofloxacin (CIPRO) 500 MG tablet Take 1 tablet (500 mg total) by mouth 2 (two) times daily. 20 tablet 0  . nitrofurantoin, macrocrystal-monohydrate, (MACROBID) 100 MG capsule Take 1 capsule (100 mg total) by mouth 2 (two) times daily. 20 capsule 0  . ondansetron (ZOFRAN-ODT) 8 MG disintegrating tablet Take 1 tablet (8 mg total) by mouth every 8 (eight) hours as needed for nausea. 15 tablet 0  . oxyCODONE-acetaminophen (ROXICET) 5-325 MG per tablet Take 1 tablet by mouth every 8 (eight) hours as needed for severe pain. 20 tablet 0   No facility-administered medications prior to visit.    Social History   Social History  . Marital Status: Married    Spouse Name: N/A  . Number of Children: N/A  . Years of Education: N/A   Social History Main Topics  . Smoking status: Never Smoker   . Smokeless tobacco: None  . Alcohol Use: No  . Drug Use: No  . Sexual Activity: Not Asked   Other Topics Concern  . None   Social History Narrative     Review of Systems  Constitutional: Negative for fever, chills and appetite change.  HENT: Negative for congestion, ear pain, postnasal drip, sinus pressure and sore throat.   Eyes: Negative for pain and redness.  Respiratory: Positive for cough, shortness of breath and wheezing.   Cardiovascular: Negative for leg swelling.  Gastrointestinal: Negative for nausea,  vomiting, abdominal pain, diarrhea, constipation and blood in stool.  Endocrine: Negative for polyuria.  Genitourinary: Negative for dysuria, urgency, frequency and flank pain.  Musculoskeletal: Negative for gait problem.  Skin: Negative for rash.  Neurological: Negative for weakness and headaches.  Psychiatric/Behavioral: Negative for confusion and decreased concentration. The patient is not nervous/anxious.     Objective:  BP 142/86 mmHg  Pulse 102  Temp(Src) 98.6 F (37 C) (Oral)  Resp 16  Ht  5' 6.25" (1.683 m)  Wt 248 lb (112.492 kg)  BMI 39.71 kg/m2  SpO2 98%  LMP 02/25/2015  Physical Exam  Constitutional: She is oriented to person, place, and time. She appears well-developed and well-nourished. No distress.  HENT:  Head: Normocephalic and atraumatic.  Right Ear: External ear normal.  Left Ear: External ear normal.  Nose: Nose normal.  Eyes: Conjunctivae and EOM are normal. Pupils are equal, round, and reactive to light. No scleral icterus.  Neck: Normal range of motion. Neck supple. No tracheal deviation present.  Cardiovascular: Normal rate, regular rhythm and normal heart sounds.   Pulmonary/Chest: Effort normal. No respiratory distress. She has wheezes. She has no rales.  Abdominal: She exhibits no mass. There is no tenderness. There is no rebound and no guarding.  Musculoskeletal: She exhibits no edema.  Lymphadenopathy:    She has no cervical adenopathy.  Neurological: She is alert and oriented to person, place, and time. Coordination normal.  Skin: Skin is warm and dry. No rash noted.  Psychiatric: She has a normal mood and affect. Her behavior is normal.      Assessment & Plan:   [name] was seen today for asthma.  Diagnoses and all orders for this visit:  Asthma with acute exacerbation, mild persistent -     albuterol (PROVENTIL) (2.5 MG/3ML) 0.083% nebulizer solution 5 mg; Take 6 mLs (5 mg total) by nebulization once. -     ipratropium (ATROVENT) nebulizer solution 0.5 mg; Take 2.5 mLs (0.5 mg total) by nebulization once.  Other orders -     predniSONE (STERAPRED UNI-PAK 21 TAB) 10 MG (21) TBPK tablet; Follow instructions on package   I have discontinued [name]'s nitrofurantoin (macrocrystal-monohydrate), ciprofloxacin, ondansetron, and oxyCODONE-acetaminophen. I am also having her start on predniSONE. Additionally, I am having her maintain her EPINEPHrine, albuterol, cetirizine, ranitidine, LORazepam, beclomethasone, and ibuprofen. We  administered albuterol and ipratropium.  Meds ordered this encounter  Medications  . albuterol (PROVENTIL) (2.5 MG/3ML) 0.083% nebulizer solution 5 mg    Sig:   . ipratropium (ATROVENT) nebulizer solution 0.5 mg    Sig:   . predniSONE (STERAPRED UNI-PAK 21 TAB) 10 MG (21) TBPK tablet    Sig: Follow instructions on package    Dispense:  21 tablet    Refill:  0    Appropriate red flag conditions were discussed with the patient as well as actions that should be taken.  Patient expressed his understanding.  Follow-up: Return if symptoms worsen or fail to improve.  Carmelina Dane, MD

## 2015-03-11 NOTE — Patient Instructions (Signed)

## 2015-05-06 ENCOUNTER — Other Ambulatory Visit: Payer: Self-pay | Admitting: Family Medicine

## 2015-05-06 ENCOUNTER — Encounter: Payer: Self-pay | Admitting: Family Medicine

## 2015-05-06 ENCOUNTER — Ambulatory Visit (INDEPENDENT_AMBULATORY_CARE_PROVIDER_SITE_OTHER): Payer: BLUE CROSS/BLUE SHIELD | Admitting: Family Medicine

## 2015-05-06 VITALS — BP 135/85 | HR 92 | Temp 99.1°F | Resp 16 | Ht 66.0 in | Wt 250.0 lb

## 2015-05-06 DIAGNOSIS — J4520 Mild intermittent asthma, uncomplicated: Secondary | ICD-10-CM | POA: Diagnosis not present

## 2015-05-06 DIAGNOSIS — IMO0001 Reserved for inherently not codable concepts without codable children: Secondary | ICD-10-CM

## 2015-05-06 DIAGNOSIS — R030 Elevated blood-pressure reading, without diagnosis of hypertension: Secondary | ICD-10-CM | POA: Diagnosis not present

## 2015-05-06 DIAGNOSIS — F411 Generalized anxiety disorder: Secondary | ICD-10-CM | POA: Diagnosis not present

## 2015-05-06 LAB — COMPLETE METABOLIC PANEL WITH GFR
ALT: 14 U/L (ref 6–29)
AST: 13 U/L (ref 10–30)
Albumin: 4.1 g/dL (ref 3.6–5.1)
Alkaline Phosphatase: 89 U/L (ref 33–115)
BILIRUBIN TOTAL: 0.3 mg/dL (ref 0.2–1.2)
BUN: 10 mg/dL (ref 7–25)
CALCIUM: 9 mg/dL (ref 8.6–10.2)
CO2: 26 mmol/L (ref 20–31)
CREATININE: 0.77 mg/dL (ref 0.50–1.10)
Chloride: 107 mmol/L (ref 98–110)
GFR, Est Non African American: >89 mL/min (ref >=60)
Glucose, Bld: 74 mg/dL (ref 65–99)
Potassium: 3.9 mmol/L (ref 3.5–5.3)
Sodium: 141 mmol/L (ref 135–146)
TOTAL PROTEIN: 6.8 g/dL (ref 6.1–8.1)

## 2015-05-06 LAB — CBC
HCT: 35.4 % — ABNORMAL LOW (ref 36.0–46.0)
HEMOGLOBIN: 10.9 g/dL — AB (ref 12.0–15.0)
MCH: 22.6 pg — AB (ref 26.0–34.0)
MCHC: 30.8 g/dL (ref 30.0–36.0)
MCV: 73.3 fL — ABNORMAL LOW (ref 78.0–100.0)
MPV: 10.1 fL (ref 8.6–12.4)
Platelets: 348 10*3/uL (ref 150–400)
RBC: 4.83 MIL/uL (ref 3.87–5.11)
RDW: 16.4 % — ABNORMAL HIGH (ref 11.5–15.5)
WBC: 8 10*3/uL (ref 4.0–10.5)

## 2015-05-06 LAB — LIPID PANEL
CHOL/HDL RATIO: 3.7 ratio (ref <=5.0)
CHOLESTEROL: 154 mg/dL (ref 125–200)
HDL: 42 mg/dL — AB (ref >=46)
LDL Cholesterol: 86 mg/dL (ref <130)
TRIGLYCERIDES: 130 mg/dL (ref <150)
VLDL: 26 mg/dL (ref <30)

## 2015-05-06 LAB — TSH: TSH: 1.19 u[IU]/mL (ref 0.350–4.500)

## 2015-05-06 MED ORDER — ALBUTEROL SULFATE HFA 108 (90 BASE) MCG/ACT IN AERS: 2.0000 | INHALATION_SPRAY | Freq: Four times a day (QID) | RESPIRATORY_TRACT | Status: DC | PRN

## 2015-05-06 MED ORDER — LORAZEPAM 0.5 MG PO TABS: ORAL_TABLET | ORAL | Status: DC

## 2015-05-06 NOTE — Progress Notes (Signed)
Subjective:    Patient ID: Sylvia Cantrell, female    DOB: [birth year], 40 y.o.   MRN: [id number]  HPI This is a pleasant 40 yo female who presents today for follow up of an elevated blood pressure. She was seen 03/11/15 for an asthma exacerbation and had a mildly elevated blood pressure. She is married and has two children aged 22 and 44. She works full time in [place]. She likes her work.   She has noticed that her anxiety makes her blood pressure go up. She has recently had a diagnosis of ovarian cysts with some pain. Her tumor markers were negative. She may need cyst removal in the future. Is seen at [facility]   She has seen Berniece Andreas in the past, but would like to establish care here. Sees allergist with good control of allergy symtpoms.   She has a long standing history of anxiety, little depression. Anxiety tends to come in waves. Rarely takes ativan. She writes to help with her stress level. Has occasional insomnia related to her anxiety.   She is seen by [facility]. She is doing well on Breo. Requests refill of albuterol inhaler today. Rarely uses. Used more recently with URI.   Past Medical History  Diagnosis Date  . Allergy   . Depression   . Anemia   . Anxiety   . Asthma   . Nephrolithiasis   . Anemia   . Chronic headache   . Hypertension   . IBS (irritable bowel syndrome)   . Kidney stones   . Obesity   . Seizures [facility])    Past Surgical History  Procedure Laterality Date  . Breast surgery    . Tonsillectomy  1987   Family History  Problem Relation Age of Onset  . Hypertension Mother   . Hyperthyroidism Mother   . Heart failure Father   . Heart failure Maternal Grandmother   . Hypertension Maternal Grandmother   . Heart failure Paternal Grandfather   . Kidney disease Maternal Grandmother    Social History  Substance Use Topics  . Smoking status: Never Smoker   . Smokeless tobacco: None  . Alcohol Use: No    Review  of Systems  Respiratory: Negative for cough, chest tightness, shortness of breath and wheezing.   Cardiovascular: Positive for palpitations (1-2x per month, last seconds). Negative for chest pain.  Neurological: Negative for dizziness, light-headedness and headaches.  Psychiatric/Behavioral: Negative for dysphoric mood. The patient is nervous/anxious (occasionally, good relief with ativan 1-3x/ month. ).        Objective:   Physical Exam Physical Exam  Constitutional: Oriented to person, place, and time. She appears well-developed and well-nourished.  HENT:  Head: Normocephalic and atraumatic.  Eyes: Conjunctivae are normal.  Neck: Normal range of motion. Neck supple.  Cardiovascular: Normal rate, regular rhythm and normal heart sounds.   Pulmonary/Chest: Effort normal and breath sounds normal.  Musculoskeletal: Normal range of motion.  Neurological: Alert and oriented to person, place, and time.  Skin: Skin is warm and dry.  Psychiatric: Normal mood and affect. Behavior is normal. Judgment and thought content normal.  Vitals reviewed.  BP 135/85 mmHg  Pulse 92  Temp(Src) 99.1 F (37.3 C)  Resp 16  Ht  (1.676 m)  Wt 250 lb (113.399 kg)  BMI 40.37 kg/m2 Wt Readings from Last 3 Encounters:  05/06/15 250 lb (113.399 kg)  03/11/15 248 lb (112.492 kg)  11/19/14 248 lb 4 oz (112.605  kg)      Assessment & Plan:  1. Asthma, mild intermittent, uncomplicated - albuterol (PROVENTIL HFA;VENTOLIN HFA) 108 (90 Base) MCG/ACT inhaler; Inhale 2 puffs into the lungs every 6 (six) hours as needed for wheezing or shortness of breath.  Dispense: 1 Inhaler; Refill: 2  2. Generalized anxiety disorder - LORazepam (ATIVAN) 0.5 MG tablet; Take 1/2-1 tab PO BID prn  Dispense: 30 tablet; Refill: 1  3. Elevated blood pressure - discussed role of stress, weight  In elevated BP - she has upcoming appointments with allergy and gyn, will let me know if BP elevated at those visits. - CBC -  COMPLETE METABOLIC PANEL WITH GFR  4. Morbid obesity due to excess calories (HCC) - Lipid panel - TSH - Hemoglobin A1c  - follow up 3 months  Olean Ree, FNP-BC  [facility], [facility]  05/08/2015 4:17 PM

## 2015-05-07 LAB — HEMOGLOBIN A1C
Hgb A1c MFr Bld: 6 % — ABNORMAL HIGH (ref <5.7)
Mean Plasma Glucose: 126 mg/dL — ABNORMAL HIGH (ref <117)

## 2015-05-08 LAB — IRON AND TIBC
%SAT: 5 % — AB (ref 11–50)
Iron: 21 ug/dL — ABNORMAL LOW (ref 40–190)
TIBC: 402 ug/dL (ref 250–450)
UIBC: 381 ug/dL (ref 125–400)

## 2015-05-08 LAB — FERRITIN: FERRITIN: 6 ng/mL — AB (ref 10–291)

## 2015-05-09 ENCOUNTER — Other Ambulatory Visit: Payer: Self-pay | Admitting: Obstetrics and Gynecology

## 2015-05-09 DIAGNOSIS — N83209 Unspecified ovarian cyst, unspecified side: Secondary | ICD-10-CM

## 2015-05-12 ENCOUNTER — Other Ambulatory Visit: Payer: Self-pay | Admitting: Obstetrics and Gynecology

## 2015-05-12 DIAGNOSIS — N83209 Unspecified ovarian cyst, unspecified side: Secondary | ICD-10-CM

## 2015-05-15 ENCOUNTER — Ambulatory Visit
Admission: RE | Admit: 2015-05-15 | Discharge: 2015-05-15 | Disposition: A | Discharge status: Home / Self Care | Payer: Self-pay | Source: Ambulatory Visit | Attending: Obstetrics and Gynecology | Admitting: Obstetrics and Gynecology

## 2015-05-15 DIAGNOSIS — N83209 Unspecified ovarian cyst, unspecified side: Secondary | ICD-10-CM

## 2015-05-19 ENCOUNTER — Encounter: Payer: Self-pay | Admitting: Family Medicine

## 2015-10-15 DIAGNOSIS — N83209 Unspecified ovarian cyst, unspecified side: Secondary | ICD-10-CM | POA: Diagnosis not present

## 2015-11-17 ENCOUNTER — Ambulatory Visit: Payer: BLUE CROSS/BLUE SHIELD | Admitting: Family Medicine

## 2015-11-17 VITALS — BP 120/86 | HR 98 | Temp 99.2°F | Resp 16 | Ht 66.75 in | Wt 250.6 lb

## 2015-11-17 DIAGNOSIS — R42 Dizziness and giddiness: Secondary | ICD-10-CM | POA: Diagnosis not present

## 2015-11-17 DIAGNOSIS — R110 Nausea: Secondary | ICD-10-CM

## 2015-11-17 DIAGNOSIS — R1032 Left lower quadrant pain: Secondary | ICD-10-CM | POA: Diagnosis not present

## 2015-11-17 DIAGNOSIS — R350 Frequency of micturition: Secondary | ICD-10-CM

## 2015-11-17 LAB — POCT CBC
GRANULOCYTE PERCENT: 59.4 % (ref 37–80)
HEMATOCRIT: 31.6 % — AB (ref 37.7–47.9)
Hemoglobin: 10.5 g/dL — AB (ref 12.2–16.2)
Lymph, poc: 3 (ref 0.6–3.4)
MCH: 23.1 pg — AB (ref 27–31.2)
MCHC: 33.4 g/dL (ref 31.8–35.4)
MCV: 69.2 fL — AB (ref 80–97)
MID (CBC): 0.6 (ref 0–0.9)
MPV: 8.4 fL (ref 0–99.8)
POC Granulocyte: 5.3 (ref 2–6.9)
POC LYMPH %: 34.1 % (ref 10–50)
POC MID %: 6.5 %M (ref 0–12)
Platelet Count, POC: 312 10*3/uL (ref 142–424)
RBC: 4.56 M/uL (ref 4.04–5.48)
RDW, POC: 17.3 %
WBC: 8.9 10*3/uL (ref 4.6–10.2)

## 2015-11-17 LAB — POC MICROSCOPIC URINALYSIS (UMFC): Mucus: ABSENT

## 2015-11-17 LAB — POCT URINALYSIS DIP (MANUAL ENTRY)
BILIRUBIN UA: NEGATIVE
BILIRUBIN UA: NEGATIVE
Glucose, UA: NEGATIVE
Leukocytes, UA: NEGATIVE
NITRITE UA: NEGATIVE
PH UA: 6
Protein Ur, POC: NEGATIVE
Spec Grav, UA: 1.015
Urobilinogen, UA: 0.2

## 2015-11-17 LAB — POCT URINE PREGNANCY: PREG TEST UR: NEGATIVE

## 2015-11-17 LAB — GLUCOSE, POCT (MANUAL RESULT ENTRY): POC Glucose: 98 mg/dl (ref 70–99)

## 2015-11-17 MED ORDER — ONDANSETRON 4 MG PO TBDP: 4.0000 mg | ORAL_TABLET | Freq: Three times a day (TID) | ORAL | 0 refills | Status: DC | PRN

## 2015-11-17 MED ORDER — ONDANSETRON 4 MG PO TBDP
4.0000 mg | ORAL_TABLET | Freq: Once | ORAL | Status: AC
  Administered 2015-11-17: 4 mg via ORAL

## 2015-11-17 NOTE — Progress Notes (Signed)
Subjective:  By signing my name below, I, Stann Ore, attest that this documentation has been prepared under the direction and in the presence of Meredith Staggers, MD. Electronically Signed: Stann Ore, Scribe. 11/17/2015 , 5:13 PM .  Patient was seen in Room 1 .   Patient ID: Sylvia Cantrell, female    DOB: 10-27-1975, 40 y.o.   MRN: [id number] Chief Complaint  Patient presents with  . Other    pain with urination x 2 days   . Other    urinary frequency x 4 days  . Other    nausea started this am, assoc with dizziness  . Headache    this am   HPI Sylvia Cantrell is a 40 y.o. female  Here for possible UTI with nausea with dizziness and headache today.   Patient states she was nearly twice as much water 3 days ago and noticed increased urination and dysuria. Yesterday, she also noticed bilateral low abdominal pain.   She woke up this morning with headache and nausea. She ate some food and felt better, but symptoms returned after 5 minutes with dizziness. She measured a low grade fever (tmax 99) and had temperature of 99.2 during triage. She had a bowel movement today; she has history of IBS but denies any changes with her bowels. She denies recent antibiotics. She informs someone at her son's daycare had stomach bug; her son doesn't have any symptoms. She denies vomiting.   Her LMP was 10/27/15. She denies possible chance of pregnancy and is currently on contraception.   [Addendum] She does have a history of anemia iron deficiency. This is thought due to heavy menses, and under treatment by OBGYN. She is still having heavy periods with her current hormones and discussing treatment with OBGYN. She is taking iron supplements tid.   Patient Active Problem List   Diagnosis Date Noted  . Acute pyelonephritis 11/19/2014  . Flank pain, acute 11/19/2014  . NEPHROLITHIASIS, HX OF 02/15/2010  . MIGRAINE HEADACHE 02/11/2010  . UPPER RESPIRATORY INFECTION, VIRAL 02/11/2010  . ALLERGIC  RHINITIS 02/11/2010  . Asthma 02/11/2010   Past Medical History:  Diagnosis Date  . Allergy   . Anemia   . Anemia   . Anxiety   . Asthma   . Chronic headache   . Depression   . Hypertension   . IBS (irritable bowel syndrome)   . Kidney stones   . Nephrolithiasis   . Obesity   . Seizures (HCC)    Past Surgical History:  Procedure Laterality Date  . BREAST SURGERY    . TONSILLECTOMY  1987   Allergies  Allergen Reactions  . Amoxicillin     Hives with first dose  . Codeine     REACTION: itching   Prior to Admission medications   Medication Sig Start Date End Date Taking? Authorizing Provider  albuterol (PROVENTIL HFA;VENTOLIN HFA) 108 (90 Base) MCG/ACT inhaler Inhale 2 puffs into the lungs every 6 (six) hours as needed for wheezing or shortness of breath. 05/06/15  Yes Emi Belfast, FNP  EPINEPHrine (EPIPEN) 0.3 mg/0.3 mL SOAJ injection Inject 0.3 mLs (0.3 mg total) into the muscle once. 03/12/13  Yes Peyton Najjar, MD  fluticasone (FLONASE) 50 MCG/ACT nasal spray SHAKE LQ AND U 1 SPR IEN QD 03/31/15  Yes Historical Provider, MD  Fluticasone Furoate-Vilanterol (BREO ELLIPTA) 100-25 MCG/INH AEPB Inhale into the lungs.   Yes Historical Provider, MD  ibuprofen (ADVIL,MOTRIN) 200 MG tablet Take 200 mg by mouth every  6 (six) hours as needed.   Yes Historical Provider, MD  LEVORA 0.15/30, 28, 0.15-30 MG-MCG tablet  03/09/15  Yes Historical Provider, MD  LORazepam (ATIVAN) 0.5 MG tablet Take 1/2-1 tab PO BID prn 05/06/15  Yes Emi Belfast, FNP  ranitidine (ZANTAC) 150 MG tablet Take 150 mg by mouth as needed for heartburn.   Yes Historical Provider, MD   Social History   Social History  . Marital status: Married    Spouse name: N/A  . Number of children: N/A  . Years of education: N/A   Occupational History  . Not on file.   Social History Main Topics  . Smoking status: Never Smoker  . Smokeless tobacco: Not on file  . Alcohol use No  . Drug use: No  . Sexual  activity: Not on file   Other Topics Concern  . Not on file   Social History Narrative  . No narrative on file   Review of Systems  Constitutional: Positive for fever. Negative for appetite change, chills, diaphoresis and fatigue.  Gastrointestinal: Positive for abdominal pain and nausea. Negative for constipation, diarrhea and vomiting.  Genitourinary: Positive for dysuria and frequency.  Neurological: Positive for dizziness and headaches.       Objective:   Physical Exam  Constitutional: She is oriented to person, place, and time. She appears well-developed and well-nourished. No distress.  HENT:  Head: Normocephalic and atraumatic.  Eyes: EOM are normal. Pupils are equal, round, and reactive to light.  Neck: Neck supple.  Cardiovascular: Normal rate, regular rhythm and normal heart sounds.  Exam reveals no gallop and no friction rub.   No murmur heard. Pulmonary/Chest: Effort normal and breath sounds normal. No respiratory distress.  Abdominal: There is tenderness in the suprapubic area and left lower quadrant. There is no CVA tenderness, no tenderness at McBurney's point and negative Murphy's sign.  Musculoskeletal: Normal range of motion.  Neurological: She is alert and oriented to person, place, and time.  Skin: Skin is warm and dry.  Psychiatric: She has a normal mood and affect. Her behavior is normal.  Nursing note and vitals reviewed.   Vitals:   11/17/15 1609  BP: 120/86  Pulse: 98  Resp: 16  Temp: 99.2 F (37.3 C)  TempSrc: Oral  SpO2: 99%  Weight: 250 lb 9.6 oz (113.7 kg)  Height: 5' 6.75" (1.695 m)   Results for orders placed or performed in visit on 11/17/15  POCT urinalysis dipstick  Result Value Ref Range   Color, UA yellow yellow   Clarity, UA clear clear   Glucose, UA negative negative   Bilirubin, UA negative negative   Ketones, POC UA negative negative   Spec Grav, UA 1.015    Blood, UA trace-lysed (A) negative   pH, UA 6.0    Protein Ur,  POC negative negative   Urobilinogen, UA 0.2    Nitrite, UA Negative Negative   Leukocytes, UA Negative Negative  POCT Microscopic Urinalysis ([facility])  Result Value Ref Range   WBC,UR,HPF,POC None None WBC/hpf   RBC,UR,HPF,POC None None RBC/hpf   Bacteria None None, Too numerous to count   Mucus Absent Absent   Epithelial Cells, UR Per Microscopy None None, Too numerous to count cells/hpf  POCT CBC  Result Value Ref Range   WBC 8.9 4.6 - 10.2 K/uL   Lymph, poc 3.0 0.6 - 3.4   POC LYMPH PERCENT 34.1 10 - 50 %L   MID (cbc) 0.6 0 - 0.9  POC MID % 6.5 0 - 12 %M   POC Granulocyte 5.3 2 - 6.9   Granulocyte percent 59.4 37 - 80 %G   RBC 4.56 4.04 - 5.48 M/uL   Hemoglobin 10.5 (A) 12.2 - 16.2 g/dL   HCT, POC 16.1 (A) 09.6 - 47.9 %   MCV 69.2 (A) 80 - 97 fL   MCH, POC 23.1 (A) 27 - 31.2 pg   MCHC 33.4 31.8 - 35.4 g/dL   RDW, POC 04.5 %   Platelet Count, POC 312 142 - 424 K/uL   MPV 8.4 0 - 99.8 fL  POCT glucose (manual entry)  Result Value Ref Range   POC Glucose 98 70 - 99 mg/dl  POCT urine pregnancy  Result Value Ref Range   Preg Test, Ur Negative Negative   [5:10PM] zofran-ODT 4mg  given     Assessment & Plan:    Sylvia Cantrell is a 40 y.o. female Urinary frequency - Plan: POCT urinalysis dipstick, POCT Microscopic Urinalysis ([facility]), POCT urine pregnancy, Urine culture  - reassuring UA. Will check urine culture, but hold on abx at this point.   LLQ abdominal pain - Plan: POCT urine pregnancy, Urine cultureNausea without vomiting - Plan: POCT CBC, POCT urine pregnancy, ondansetron (ZOFRAN-ODT) disintegrating tablet 4 mg, ondansetron (ZOFRAN ODT) 4 MG disintegrating tablet  - possible viral gastroenteritis.   -zofran given, Rx for home, symptomatic care. RTC preacautions if not improving this week - sooner if worse.   Dizziness - Plan: POCT CBC, POCT glucose (manual entry), Orthostatic vital signs  - overall reassuring orthostatics. Hx of anemia - slightly lower.   - cont  iron supplement and follow up with obgyn to discus heavy menses.    Meds ordered this encounter  Medications  . ondansetron (ZOFRAN-ODT) disintegrating tablet 4 mg  . ondansetron (ZOFRAN ODT) 4 MG disintegrating tablet    Sig: Take 1 tablet (4 mg total) by mouth every 8 (eight) hours as needed for nausea or vomiting.    Dispense:  10 tablet    Refill:  0   Patient Instructions   Continue your iron supplement and follow-up with OB/GYN to discuss the heavy menses, as these are most likely the cause of your iron deficiency anemia. If needed, we can always refer you to hematology for possible IV iron or other treatments.  Your CBC or blood count overall looks okay today, and no obvious signs of infection at this time. I will check urine culture but this is likely going to be normal. Make sure you are drinking plenty of fluids in the next few days, bland foods, rest, and Zofran if needed for nausea/vomiting. If any fever, worsening abdominal pain, or other worsening symptoms, return here or emergency room.  IF you received an x-ray today, you will receive an invoice from [facility]. Please contact [facility] at [phone] with questions or concerns regarding your invoice.   IF you received labwork today, you will receive an invoice from [employer]. Please contact [facility] at [phone] with questions or concerns regarding your invoice.   Our billing staff will not be able to assist you with questions regarding bills from these companies.  You will be contacted with the lab results as soon as they are available. The fastest way to get your results is to activate your My Chart account. Instructions are located on the last page of this paperwork. If you have not heard from [place] regarding the results in 2 weeks, please contact this  office.       I personally performed the services described in this documentation, which was scribed in my presence.  The recorded information has been reviewed and considered, and addended by me as needed.   Signed,   Meredith StaggersJeffrey Rumi Taras, MD [facility].  11/18/15 12:11 AM

## 2015-11-17 NOTE — Patient Instructions (Addendum)
Continue your iron supplement and follow-up with OB/GYN to discuss the heavy menses, as these are most likely the cause of your iron deficiency anemia. If needed, we can always refer you to hematology for possible IV iron or other treatments.  Your CBC or blood count overall looks okay today, and no obvious signs of infection at this time. I will check urine culture but this is likely going to be normal. Make sure you are drinking plenty of fluids in the next few days, bland foods, rest, and Zofran if needed for nausea/vomiting. If any fever, worsening abdominal pain, or other worsening symptoms, return here or emergency room.  IF you received an x-ray today, you will receive an invoice from [facility]. Please contact [facility] at [phone] with questions or concerns regarding your invoice.   IF you received labwork today, you will receive an invoice from [facility]. Please contact [facility] at [phone] with questions or concerns regarding your invoice.   Our billing staff will not be able to assist you with questions regarding bills from these companies.  You will be contacted with the lab results as soon as they are available. The fastest way to get your results is to activate your My Chart account. Instructions are located on the last page of this paperwork. If you have not heard from us regarding the results in 2 weeks, please contact this office.

## 2015-11-18 ENCOUNTER — Encounter: Payer: Self-pay | Admitting: Family Medicine

## 2015-11-19 LAB — URINE CULTURE: Organism ID, Bacteria: 10000

## 2015-12-22 DIAGNOSIS — T63441A Toxic effect of venom of bees, accidental (unintentional), initial encounter: Secondary | ICD-10-CM | POA: Diagnosis not present

## 2015-12-22 DIAGNOSIS — J301 Allergic rhinitis due to pollen: Secondary | ICD-10-CM | POA: Diagnosis not present

## 2015-12-22 DIAGNOSIS — J3089 Other allergic rhinitis: Secondary | ICD-10-CM | POA: Diagnosis not present

## 2015-12-22 DIAGNOSIS — J4540 Moderate persistent asthma, uncomplicated: Secondary | ICD-10-CM | POA: Diagnosis not present

## 2015-12-22 DIAGNOSIS — J3081 Allergic rhinitis due to animal (cat) (dog) hair and dander: Secondary | ICD-10-CM | POA: Diagnosis not present

## 2016-01-13 ENCOUNTER — Other Ambulatory Visit: Payer: Self-pay | Admitting: Physician Assistant

## 2016-01-13 DIAGNOSIS — J4541 Moderate persistent asthma with (acute) exacerbation: Secondary | ICD-10-CM

## 2016-01-29 DIAGNOSIS — J301 Allergic rhinitis due to pollen: Secondary | ICD-10-CM | POA: Diagnosis not present

## 2016-01-29 DIAGNOSIS — J3081 Allergic rhinitis due to animal (cat) (dog) hair and dander: Secondary | ICD-10-CM | POA: Diagnosis not present

## 2016-01-30 DIAGNOSIS — J3089 Other allergic rhinitis: Secondary | ICD-10-CM | POA: Diagnosis not present

## 2016-03-16 DIAGNOSIS — J3089 Other allergic rhinitis: Secondary | ICD-10-CM | POA: Diagnosis not present

## 2016-03-16 DIAGNOSIS — J3081 Allergic rhinitis due to animal (cat) (dog) hair and dander: Secondary | ICD-10-CM | POA: Diagnosis not present

## 2016-03-16 DIAGNOSIS — J301 Allergic rhinitis due to pollen: Secondary | ICD-10-CM | POA: Diagnosis not present

## 2016-08-04 DIAGNOSIS — J301 Allergic rhinitis due to pollen: Secondary | ICD-10-CM | POA: Diagnosis not present

## 2016-08-04 DIAGNOSIS — T63441A Toxic effect of venom of bees, accidental (unintentional), initial encounter: Secondary | ICD-10-CM | POA: Diagnosis not present

## 2016-08-04 DIAGNOSIS — J4540 Moderate persistent asthma, uncomplicated: Secondary | ICD-10-CM | POA: Diagnosis not present

## 2016-08-04 DIAGNOSIS — J3089 Other allergic rhinitis: Secondary | ICD-10-CM | POA: Diagnosis not present

## 2016-08-10 ENCOUNTER — Ambulatory Visit (INDEPENDENT_AMBULATORY_CARE_PROVIDER_SITE_OTHER): Payer: BLUE CROSS/BLUE SHIELD | Admitting: Physician Assistant

## 2016-08-10 ENCOUNTER — Ambulatory Visit (INDEPENDENT_AMBULATORY_CARE_PROVIDER_SITE_OTHER): Payer: BLUE CROSS/BLUE SHIELD

## 2016-08-10 ENCOUNTER — Encounter: Payer: Self-pay | Admitting: Physician Assistant

## 2016-08-10 VITALS — BP 128/82 | HR 108 | Temp 98.6°F | Resp 17 | Ht 67.5 in | Wt 230.0 lb

## 2016-08-10 DIAGNOSIS — R000 Tachycardia, unspecified: Secondary | ICD-10-CM

## 2016-08-10 DIAGNOSIS — R42 Dizziness and giddiness: Secondary | ICD-10-CM

## 2016-08-10 DIAGNOSIS — R300 Dysuria: Secondary | ICD-10-CM

## 2016-08-10 DIAGNOSIS — R05 Cough: Secondary | ICD-10-CM | POA: Diagnosis not present

## 2016-08-10 LAB — POCT URINALYSIS DIP (MANUAL ENTRY)
Bilirubin, UA: NEGATIVE
GLUCOSE UA: NEGATIVE mg/dL
LEUKOCYTES UA: NEGATIVE
NITRITE UA: POSITIVE — AB
PH UA: 6 (ref 5.0–8.0)
Protein Ur, POC: 100 mg/dL — AB
Spec Grav, UA: 1.02 (ref 1.010–1.025)
UROBILINOGEN UA: 0.2 U/dL

## 2016-08-10 LAB — POCT CBC
GRANULOCYTE PERCENT: 73.2 % (ref 37–80)
HCT, POC: 31.2 % — AB (ref 37.7–47.9)
Hemoglobin: 10 g/dL — AB (ref 12.2–16.2)
Lymph, poc: 0.8 (ref 0.6–3.4)
MCH: 22.1 pg — AB (ref 27–31.2)
MCHC: 31.9 g/dL (ref 31.8–35.4)
MCV: 69.1 fL — AB (ref 80–97)
MID (cbc): 0.4 (ref 0–0.9)
MPV: 8.1 fL (ref 0–99.8)
PLATELET COUNT, POC: 303 10*3/uL (ref 142–424)
POC Granulocyte: 3.1 (ref 2–6.9)
POC LYMPH PERCENT: 18.4 %L (ref 10–50)
POC MID %: 8.4 %M (ref 0–12)
RBC: 4.51 M/uL (ref 4.04–5.48)
RDW, POC: 18.2 %
WBC: 4.3 10*3/uL — AB (ref 4.6–10.2)

## 2016-08-10 LAB — POCT INFLUENZA A/B
INFLUENZA A, POC: NEGATIVE
Influenza B, POC: NEGATIVE

## 2016-08-10 LAB — POC MICROSCOPIC URINALYSIS (UMFC): Mucus: ABSENT

## 2016-08-10 LAB — POCT URINE PREGNANCY: Preg Test, Ur: NEGATIVE

## 2016-08-10 MED ORDER — LEVOFLOXACIN 500 MG PO TABS: 750.0000 mg | ORAL_TABLET | Freq: Every day | ORAL | 0 refills | Status: DC

## 2016-08-10 NOTE — Progress Notes (Signed)
08/10/2016 11:28 AM   DOB: 11-Nov-1975 / MRN: [id number]  SUBJECTIVE:  Sylvia Cantrell is a 41 y.o. female presenting for abdominal cramping that started yesterday. Thought that this was 2/2 to menstrual cramps however last night noted fever.  Associates generalized myalgia, urinary frequency last night,.  She has had a UTI in the past with similar symptoms.   Has tried Nyquil.   She has a history of ovarian cyst that have since resolved and feels that she will have heavy bleeding. This is being managed by GYN.     She is allergic to amoxicillin and codeine.   She  has a past medical history of Allergy; Anemia; Anemia; Anxiety; Asthma; Chronic headache; Depression; Hypertension; IBS (irritable bowel syndrome); Kidney stones; Nephrolithiasis; Obesity; and Seizures ([place]).    She  reports that she has never smoked. She has never used smokeless tobacco. She reports that she does not drink alcohol or use drugs. She  reports that she does not engage in sexual activity. The patient  has a past surgical history that includes Breast surgery and Tonsillectomy (1987).  Her family history includes Heart failure in her father, maternal grandmother, and paternal grandfather; Hypertension in her maternal grandmother and mother; Hyperthyroidism in her mother; Kidney disease in her maternal grandmother.  Review of Systems  Constitutional: Negative for chills and fever.  Respiratory: Positive for cough (pre-exisiting). Negative for hemoptysis, sputum production, shortness of breath and wheezing.   Cardiovascular: Negative for chest pain and leg swelling.  Genitourinary: Positive for dysuria, flank pain, frequency and urgency. Negative for hematuria.  Skin: Negative for rash.  Neurological: Positive for dizziness (orthostatic).    The problem list and medications were reviewed and updated by myself where necessary and exist elsewhere in the encounter.   OBJECTIVE:  BP 128/82   Pulse (!) 108   Temp 98.6 F  (37 C) (Oral)   Resp 17   Ht 5' 7.5" (1.715 m)   Wt 230 lb (104.3 kg)   LMP 08/10/2016 (Approximate)   SpO2 97%   BMI 35.49 kg/m   Physical Exam  Constitutional: She is oriented to person, place, and time. She appears well-developed.  Non-toxic appearance. She does not have a sickly appearance. She appears ill. No distress.  Cardiovascular: Regular rhythm and normal heart sounds.  Tachycardia present.   Pulmonary/Chest: Effort normal and breath sounds normal.  Musculoskeletal: Normal range of motion.  Neurological: She is alert and oriented to person, place, and time. She displays normal reflexes. No cranial nerve deficit. She exhibits normal muscle tone. Coordination normal.  Skin: Skin is warm and dry. She is not diaphoretic.  Psychiatric: She has a normal mood and affect.    Pulse Readings from Last 3 Encounters:  08/10/16 (!) 108  11/17/15 98  05/06/15 92     Results for orders placed or performed in visit on 08/10/16 (from the past 72 hour(s))  POCT urinalysis dipstick     Status: Abnormal   Collection Time: 08/10/16 10:21 AM  Result Value Ref Range   Color, UA red (A) yellow   Clarity, UA cloudy (A) clear   Glucose, UA negative negative mg/dL   Bilirubin, UA negative negative   Ketones, POC UA trace (5) (A) negative mg/dL   Spec Grav, UA 1.020 1.010 - 1.025   Blood, UA large (A) negative   pH, UA 6.0 5.0 - 8.0   Protein Ur, POC =100 (A) negative mg/dL   Urobilinogen, UA 0.2 0.2 or 1.0 E.U./dL  Nitrite, UA Positive (A) Negative   Leukocytes, UA Negative Negative  POCT urine pregnancy     Status: None   Collection Time: 08/10/16 10:22 AM  Result Value Ref Range   Preg Test, Ur Negative Negative  POCT Microscopic Urinalysis ([facility])     Status: Abnormal   Collection Time: 08/10/16 10:31 AM  Result Value Ref Range   WBC,UR,HPF,POC None None WBC/hpf   RBC,UR,HPF,POC Many (A) None RBC/hpf   Bacteria None None, Too numerous to count   Mucus Absent Absent    Epithelial Cells, UR Per Microscopy Few (A) None, Too numerous to count cells/hpf  POCT CBC     Status: Abnormal   Collection Time: 08/10/16 10:42 AM  Result Value Ref Range   WBC 4.3 (A) 4.6 - 10.2 K/uL   Lymph, poc 0.8 0.6 - 3.4   POC LYMPH PERCENT 18.4 10 - 50 %L   MID (cbc) 0.4 0 - 0.9   POC MID % 8.4 0 - 12 %M   POC Granulocyte 3.1 2 - 6.9   Granulocyte percent 73.2 37 - 80 %G   RBC 4.51 4.04 - 5.48 M/uL   Hemoglobin 10.0 (A) 12.2 - 16.2 g/dL   HCT, POC 31.2 (A) 37.7 - 47.9 %   MCV 69.1 (A) 80 - 97 fL   MCH, POC 22.1 (A) 27 - 31.2 pg   MCHC 31.9 31.8 - 35.4 g/dL   RDW, POC 18.2 %   Platelet Count, POC 303 142 - 424 K/uL   MPV 8.1 0 - 99.8 fL  POCT Influenza A/B     Status: None   Collection Time: 08/10/16 11:01 AM  Result Value Ref Range   Influenza A, POC Negative Negative   Influenza B, POC Negative Negative   Lab Results  Component Value Date   HGBA1C 6.0 (H) 05/06/2015      ASSESSMENT AND PLAN:  Sylvia Cantrell was seen today for dysuria.  Diagnoses and all orders for this visit:  Dysuria: Complex presentation.  Flu negative although that remains on the differential given leukopenia.  Sepsis criteria is met per leukopenia and tachycardia, however she is no distress.  Will cover with broad spectrum for now and she will come back for eval tomorrow with Dr. Nolon Rod in the afternoon.  -     POCT urinalysis dipstick -     POCT Microscopic Urinalysis ([facility]) -     POCT urine pregnancy -     Urine culture  Tachycardia: Stable orthostatics. See prob one.  -     POCT CBC -     Basic metabolic panel -     POCT Influenza A/B -     DG Chest 2 View; Future  Dizziness -     Orthostatic vital signs    The patient is advised to call or return to clinic if she does not see an improvement in symptoms, or to seek the care of the closest emergency department if she worsens with the above plan.   Philis Fendt, MHS, PA-C [facility] [place]  [facility] 08/10/2016 11:28 AM

## 2016-08-10 NOTE — Patient Instructions (Addendum)
Take 800 mg of ibuprofen every 8 hours along with tylenol 1000 mg every 8 hours.  Come back to the [address] building to see Dr. Eldred Manges around the early afternoon.     IF you received an x-ray today, you will receive an invoice from [facility]. Please contact [facility] at [phone] with questions or concerns regarding your invoice.   IF you received labwork today, you will receive an invoice from [place]. Please contact [facility] at [phone] with questions or concerns regarding your invoice.   Our billing staff will not be able to assist you with questions regarding bills from these companies.  You will be contacted with the lab results as soon as they are available. The fastest way to get your results is to activate your My Chart account. Instructions are located on the last page of this paperwork. If you have not heard from [place] regarding the results in 2 weeks, please contact this office.

## 2016-08-11 ENCOUNTER — Ambulatory Visit (INDEPENDENT_AMBULATORY_CARE_PROVIDER_SITE_OTHER): Payer: BLUE CROSS/BLUE SHIELD | Admitting: Family Medicine

## 2016-08-11 ENCOUNTER — Encounter: Payer: Self-pay | Admitting: Family Medicine

## 2016-08-11 VITALS — BP 146/92 | HR 108 | Temp 99.1°F | Resp 18 | Ht 66.93 in | Wt 228.6 lb

## 2016-08-11 DIAGNOSIS — D500 Iron deficiency anemia secondary to blood loss (chronic): Secondary | ICD-10-CM

## 2016-08-11 DIAGNOSIS — N920 Excessive and frequent menstruation with regular cycle: Secondary | ICD-10-CM

## 2016-08-11 DIAGNOSIS — R000 Tachycardia, unspecified: Secondary | ICD-10-CM

## 2016-08-11 DIAGNOSIS — E876 Hypokalemia: Secondary | ICD-10-CM | POA: Diagnosis not present

## 2016-08-11 DIAGNOSIS — R42 Dizziness and giddiness: Secondary | ICD-10-CM

## 2016-08-11 LAB — POCT CBC
Granulocyte percent: 44.1 %G (ref 37–80)
HCT, POC: 33.3 % — AB (ref 37.7–47.9)
Hemoglobin: 10.7 g/dL — AB (ref 12.2–16.2)
Lymph, poc: 1.9 (ref 0.6–3.4)
MCH, POC: 22 pg — AB (ref 27–31.2)
MCHC: 32.1 g/dL (ref 31.8–35.4)
MCV: 68.6 fL — AB (ref 80–97)
MID (CBC): 0.2 (ref 0–0.9)
MPV: 9 fL (ref 0–99.8)
PLATELET COUNT, POC: 301 10*3/uL (ref 142–424)
POC Granulocyte: 1.6 — AB (ref 2–6.9)
POC LYMPH PERCENT: 50.7 %L — AB (ref 10–50)
POC MID %: 5.2 %M (ref 0–12)
RBC: 4.86 M/uL (ref 4.04–5.48)
RDW, POC: 18.2 %
WBC: 3.7 10*3/uL — AB (ref 4.6–10.2)

## 2016-08-11 LAB — BASIC METABOLIC PANEL
BUN/Creatinine Ratio: 13 (ref 9–23)
BUN: 10 mg/dL (ref 6–24)
CALCIUM: 8.9 mg/dL (ref 8.7–10.2)
CHLORIDE: 102 mmol/L (ref 96–106)
CO2: 22 mmol/L (ref 18–29)
Creatinine, Ser: 0.77 mg/dL (ref 0.57–1.00)
GFR calc non Af Amer: 97 mL/min/{1.73_m2} (ref >59)
GFR, EST AFRICAN AMERICAN: 112 mL/min/{1.73_m2} (ref >59)
GLUCOSE: 132 mg/dL — AB (ref 65–99)
POTASSIUM: 3.4 mmol/L — AB (ref 3.5–5.2)
Sodium: 140 mmol/L (ref 134–144)

## 2016-08-11 LAB — URINE CULTURE

## 2016-08-11 MED ORDER — DOCUSATE SODIUM 100 MG PO CAPS: 100.0000 mg | ORAL_CAPSULE | Freq: Two times a day (BID) | ORAL | 0 refills | Status: AC

## 2016-08-11 MED ORDER — POTASSIUM CHLORIDE CRYS ER 20 MEQ PO TBCR: 20.0000 meq | EXTENDED_RELEASE_TABLET | Freq: Every day | ORAL | 0 refills | Status: DC

## 2016-08-11 MED ORDER — FERROUS SULFATE ER 142 (45 FE) MG PO TBCR: 1.0000 | EXTENDED_RELEASE_TABLET | Freq: Every day | ORAL | 0 refills | Status: DC

## 2016-08-11 MED ORDER — SODIUM CHLORIDE 0.9 % IV BOLUS (SEPSIS): 1000.0000 mL | Freq: Once | INTRAVENOUS | Status: DC

## 2016-08-11 MED ORDER — LACTATED RINGERS IV BOLUS (SEPSIS): 1000.0000 mL | Freq: Once | INTRAVENOUS | Status: DC

## 2016-08-11 NOTE — Patient Instructions (Addendum)
IF you received an x-ray today, you will receive an invoice from [facility]. Please contact [facility] at [phone] with questions or concerns regarding your invoice.   IF you received labwork today, you will receive an invoice from [place]. Please contact [facility] at [phone] with questions or concerns regarding your invoice.   Our billing staff will not be able to assist you with questions regarding bills from these companies.  You will be contacted with the lab results as soon as they are available. The fastest way to get your results is to activate your My Chart account. Instructions are located on the last page of this paperwork. If you have not heard from [place] regarding the results in 2 weeks, please contact this office.      Iron Deficiency Anemia, Adult Iron deficiency anemia is a condition in which the concentration of red blood cells or hemoglobin in the blood is below normal because of too little iron. Hemoglobin is a substance in red blood cells that carries oxygen to the body's tissues. When the concentration of red blood cells or hemoglobin is too low, not enough oxygen reaches these tissues. Iron deficiency anemia is usually long-lasting (chronic) and it develops over time. It may or may not cause symptoms. It is a common type of anemia. What are the causes? This condition may be caused by:  Not enough iron in the diet.  Blood loss caused by bleeding in the intestine.  Blood loss from a gastrointestinal condition like Crohn disease.  Frequent blood draws, such as from blood donation.  Abnormal absorption in the gut.  Heavy menstrual periods in women.  Cancers of the gastrointestinal system, such as colon cancer. What are the signs or symptoms? Symptoms of this condition may include:  Fatigue.  Headache.  Pale skin, lips, and nail beds.  Poor appetite.  Weakness.  Shortness of breath.  Dizziness.  Cold hands and  feet.  Fast or irregular heartbeat.  Irritability. This is more common in severe anemia.  Rapid breathing. This is more common in severe anemia. Mild anemia may not cause any symptoms. How is this diagnosed? This condition is diagnosed based on:  Your medical history.  A physical exam.  Blood tests. You may have additional tests to find the underlying cause of your anemia, such as:  Testing for blood in the stool (fecal occult blood test).  A procedure to see inside your colon and rectum (colonoscopy).  A procedure to see inside your esophagus and stomach (endoscopy).  A test in which cells are removed from bone marrow (bone marrow aspiration) or fluid is removed from the bone marrow to be examined (biopsy). This is rarely needed. How is this treated? This condition is treated by correcting the cause of your iron deficiency. Treatment may involve:  Adding iron-rich foods to your diet.  Taking iron supplements. If you are pregnant or breastfeeding, you may need to take extra iron because your normal diet usually does not provide the amount of iron that you need.  Increasing vitamin C intake. Vitamin C helps your body absorb iron. Your health care provider may recommend that you take iron supplements along with a glass of orange juice or a vitamin C supplement.  Medicines to make heavy menstrual flow lighter.  Surgery. You may need repeat blood tests to determine whether treatment is working. Depending on the underlying cause, the anemia should be corrected within 2 months of starting treatment. If the treatment does not seem  to be working, you may need more testing. Follow these instructions at home: Medicines   Take over-the-counter and prescription medicines only as told by your health care provider. This includes iron supplements and vitamins.  If you cannot tolerate taking iron supplements by mouth, talk with your health care provider about taking them through a vein  (intravenously) or an injection into a muscle.  For the best iron absorption, you should take iron supplements when your stomach is empty. If you cannot tolerate them on an empty stomach, you may need to take them with food.  Do not drink milk or take antacids at the same time as your iron supplements. Milk and antacids may interfere with iron absorption.  Iron supplements can cause constipation. To prevent constipation, include fiber in your diet as told by your health care provider. A stool softener may also be recommended. Eating and drinking   Talk with your health care provider before changing your diet. He or she may recommend that you eat foods that contain a lot of iron, such as:  Liver.  Low-fat (lean) beef.  Breads and cereals that have iron added to them (are fortified).  Eggs.  Dried fruit.  Dark green, leafy vegetables.  To help your body use the iron from iron-rich foods, eat those foods at the same time as fresh fruits and vegetables that are high in vitamin C. Foods that are high in vitamin C include:  Oranges.  Peppers.  Tomatoes.  Mangoes.  Drinkenoughfluid to keep your urine clear or pale yellow. General instructions   Return to your normal activities as told by your health care provider. Ask your health care provider what activities are safe for you.  Practice good hygiene. Anemia can make you more prone to illness and infection.  Keep all follow-up visits as told by your health care provider. This is important. Contact a health care provider if:  You feel nauseous or you vomit.  You feel weak.  You have unexplained sweating.  You develop symptoms of constipation, such as:  Having fewer than three bowel movements a week.  Straining to have a bowel movement.  Having stools that are hard, dry, or larger than normal.  Feeling full or bloated.  Pain in the lower abdomen.  Not feeling relief after having a bowel movement. Get help right  away if:  You faint. If this happens, do not drive yourself to the hospital. Call your local emergency services (911 in the [place]  You have chest pain.  You have shortness of breath that:  Is severe.  Gets worse with physical activity.  You have a rapid heartbeat.  You become light-headed when getting up from a sitting or lying down position. This information is not intended to replace advice given to you by your health care provider. Make sure you discuss any questions you have with your health care provider. Document Released: 03/26/2000 Document Revised: 12/17/2015 Document Reviewed: 12/17/2015 Elsevier Interactive Patient Education  2017 [employer].

## 2016-08-11 NOTE — Progress Notes (Signed)
Chief Complaint  Patient presents with  . Follow-up    UTI    HPI   Pt reports that she was discharge home yesterday for tachycardia, dizziness, and dysuria presumed to be with UTI.  She reports that she drove herself to her appointment today and reports that she feels better. She reports that she could benefit from drinking more water. She reports dizziness with standing but otherwise she does not feel dizzy.   This morning she ate a small amount of food, she took the xyzal for her allergies.  She has not taken motrin.  Her temperature today is 99.1.  She reports that she has not some chills today.   She reports that she has been having heavy menstrual periods since getting her IUD removed due to ovarian cysts She states that her periods are on a regular cycle but she has to change a pad every 2 hours with very heavy bleeding She states that she has a history of anemia and required iron in the past.    Past Medical History:  Diagnosis Date  . Allergy   . Anemia   . Anemia   . Anxiety   . Asthma   . Chronic headache   . Depression   . Hypertension   . IBS (irritable bowel syndrome)   . Kidney stones   . Nephrolithiasis   . Obesity   . Seizures (HCC)     Current Outpatient Prescriptions  Medication Sig Dispense Refill  . albuterol (PROVENTIL HFA;VENTOLIN HFA) 108 (90 Base) MCG/ACT inhaler Inhale 2 puffs into the lungs every 6 (six) hours as needed for wheezing or shortness of breath. 1 Inhaler 2  . EPINEPHrine (EPIPEN) 0.3 mg/0.3 mL SOAJ injection Inject 0.3 mLs (0.3 mg total) into the muscle once. 2 Device 2  . Fluticasone Furoate-Vilanterol (BREO ELLIPTA) 100-25 MCG/INH AEPB Inhale into the lungs.    Marland Kitchen ibuprofen (ADVIL,MOTRIN) 200 MG tablet Take 200 mg by mouth every 6 (six) hours as needed.    Marland Kitchen levofloxacin (LEVAQUIN) 500 MG tablet Take 1.5 tablets (750 mg total) by mouth daily. 5 tablet 0  . LORazepam (ATIVAN) 0.5 MG tablet Take 1/2-1 tab PO BID prn 30 tablet 1  .  ranitidine (ZANTAC) 150 MG tablet Take 150 mg by mouth as needed for heartburn.    . docusate sodium (COLACE) 100 MG capsule Take 1 capsule (100 mg total) by mouth 2 (two) times daily. 60 capsule 0  . Ferrous Sulfate 142 (45 Fe) MG TBCR Take 1 tablet by mouth daily. 30 tablet 0  . potassium chloride SA (K-DUR,KLOR-CON) 20 MEQ tablet Take 1 tablet (20 mEq total) by mouth daily. 30 tablet 0   Current Facility-Administered Medications  Medication Dose Route Frequency Provider Last Rate Last Dose  . sodium chloride 0.9 % bolus 1,000 mL  1,000 mL Intravenous Once Doristine Bosworth, MD        Allergies:  Allergies  Allergen Reactions  . Amoxicillin     Hives with first dose  . Codeine     REACTION: itching    Past Surgical History:  Procedure Laterality Date  . BREAST SURGERY    . TONSILLECTOMY  1987    Social History   Social History  . Marital status: Married    Spouse name: N/A  . Number of children: N/A  . Years of education: N/A   Social History Main Topics  . Smoking status: Never Smoker  . Smokeless tobacco: Never Used  . Alcohol use  No  . Drug use: No  . Sexual activity: No   Other Topics Concern  . None   Social History Narrative  . None    Review of Systems  Constitutional:       See hpi  HENT: Negative for congestion, sore throat and tinnitus.   Eyes: Negative for blurred vision and double vision.  Respiratory: Negative for cough and shortness of breath.   Cardiovascular: Negative for chest pain, palpitations and orthopnea.  Gastrointestinal: Positive for abdominal pain. Negative for constipation, diarrhea, nausea and vomiting.  Neurological: Positive for dizziness. Negative for tingling, tremors, focal weakness and headaches.  Psychiatric/Behavioral: Negative for depression. The patient is not nervous/anxious.    See hpi  Objective: Vitals:   08/11/16 1401  BP: (!) 146/92  Pulse: (!) 108  Resp: 18  Temp: 99.1 F (37.3 C)  TempSrc: Oral  SpO2:  100%  Weight: 228 lb 9.6 oz (103.7 kg)  Height: 5' 6.93" (1.7 m)    Physical Exam  Constitutional: She is oriented to person, place, and time. She appears well-developed and well-nourished.  HENT:  Head: Normocephalic and atraumatic.  Eyes: Conjunctivae and EOM are normal.  Neck: Normal range of motion. Neck supple.  Cardiovascular: Regular rhythm.  Tachycardia present.   No murmur heard. Pulmonary/Chest: Effort normal and breath sounds normal. No respiratory distress. She has no wheezes.  Neurological: She is alert and oriented to person, place, and time.  Skin: Skin is warm. Capillary refill takes less than 2 seconds. There is pallor.      Component     Latest Ref Rng & Units 08/10/2016 08/11/2016  WBC     4.6 - 10.2 K/uL 4.3 (A) 3.7 (A)  Lymph, poc     0.6 - 3.4 0.8 1.9  POC LYMPH PERCENT     10 - 50 %L 18.4 50.7 (A)  MID (cbc)     0 - 0.9 0.4 0.2  POC MID %     0 - 12 %M 8.4 5.2  POC Granulocyte     2 - 6.9 3.1 1.6 (A)  Granulocyte percent     37 - 80 %G 73.2 44.1  RBC     4.04 - 5.48 M/uL 4.51 4.86  Hemoglobin     12.2 - 16.2 g/dL 16.1 (A) 09.6 (A)  HCT     37.7 - 47.9 % 31.2 (A) 33.3 (A)  MCV     80 - 97 fL 69.1 (A) 68.6 (A)  MCH     27 - 31.2 pg 22.1 (A) 22.0 (A)  MCHC     31.8 - 35.4 g/dL 04.5 40.9  RDW, POC     % 18.2 18.2  Platelet Count, POC     142 - 424 K/uL 303 301  MPV     0 - 99.8 fL 8.1 9.0    Assessment and Plan Garlene was seen today for follow-up.  Diagnoses and all orders for this visit:  Hypokalemia Hypokalemia Would advise a week of KCl at Discussed that she should also drink electrolyte solution and water until urine clear  Tachycardia- likely due to dehydration and anemia -     POCT CBC -     Insert peripheral IV -     sodium chloride 0.9 % bolus 1,000 mL; Inject 1,000 mLs into the vein once.  Dizziness- improved after IVF hydration Dizziness due to dehydration, anemia and infection Gave IV fluid bolus Discussed  repletion K orally  -  POCT CBC -     Orthostatic vital signs -     Ferritin -     Cancel: IBC Panel -     Discontinue: lactated ringers bolus 1,000 mL; Inject 1,000 mLs into the vein once. -     Insert peripheral IV -     sodium chloride 0.9 % bolus 1,000 mL; Inject 1,000 mLs into the vein once.  Menorrhagia with regular cycle Discussed follow up with Gynecology for menorrhagia Will do an iron function panel  Her renal function is good -     Ferritin -     Cancel: IBC Panel -     Discontinue: lactated ringers bolus 1,000 mL; Inject 1,000 mLs into the vein once.  Anemia due to chronic blood loss- will check iron panel -     Transferrin -     Iron and TIBC -     docusate sodium (COLACE) 100 MG capsule; Take 1 capsule (100 mg total) by mouth 2 (two) times daily. -     Ferrous Sulfate 142 (45 Fe) MG TBCR; Take 1 tablet by mouth daily.  Other orders -     potassium chloride SA (K-DUR,KLOR-CON) 20 MEQ tablet; Take 1 tablet (20 mEq total) by mouth daily.   A total of 50 minutes were spent face-to-face with the patient during this encounter and over half of that time was spent on counseling and coordination of care.     Follow up in one week  Zoe A Creta Levin

## 2016-08-12 LAB — IRON AND TIBC
IRON SATURATION: 4 % — AB (ref 15–55)
IRON: 15 ug/dL — AB (ref 27–159)
Total Iron Binding Capacity: 384 ug/dL (ref 250–450)
UIBC: 369 ug/dL (ref 131–425)

## 2016-08-12 LAB — FERRITIN: Ferritin: 9 ng/mL — ABNORMAL LOW (ref 15–150)

## 2016-08-12 LAB — TRANSFERRIN: Transferrin: 315 mg/dL (ref 200–370)

## 2016-08-18 ENCOUNTER — Ambulatory Visit: Payer: BLUE CROSS/BLUE SHIELD | Admitting: Family Medicine

## 2016-09-02 DIAGNOSIS — Z124 Encounter for screening for malignant neoplasm of cervix: Secondary | ICD-10-CM | POA: Diagnosis not present

## 2016-09-02 DIAGNOSIS — Z6837 Body mass index (BMI) 37.0-37.9, adult: Secondary | ICD-10-CM | POA: Diagnosis not present

## 2016-09-02 DIAGNOSIS — Z01419 Encounter for gynecological examination (general) (routine) without abnormal findings: Secondary | ICD-10-CM | POA: Diagnosis not present

## 2016-09-02 DIAGNOSIS — Z1231 Encounter for screening mammogram for malignant neoplasm of breast: Secondary | ICD-10-CM | POA: Diagnosis not present

## 2016-10-14 IMAGING — US US PELVIS COMPLETE
1 series · 13 of 25 positions shown · non-contrast
Comparison: None

CLINICAL DATA: Left ovarian cystic lesions seen on office
ultrasound.

EXAM:
TRANSABDOMINAL AND TRANSVAGINAL ULTRASOUND OF PELVIS
TECHNIQUE: Both transabdominal and transvaginal ultrasound examinations of the
pelvis were performed. Transabdominal technique was performed for
global imaging of the pelvis including uterus, ovaries, adnexal
regions, and pelvic cul-de-sac. It was necessary to proceed with
endovaginal exam following the transabdominal exam to visualize the
endometrial stripe and ovaries.

[Series 1: us pelvis complete · 0.28mm/px · 13 of 56 slices shown]
[im 1/56]
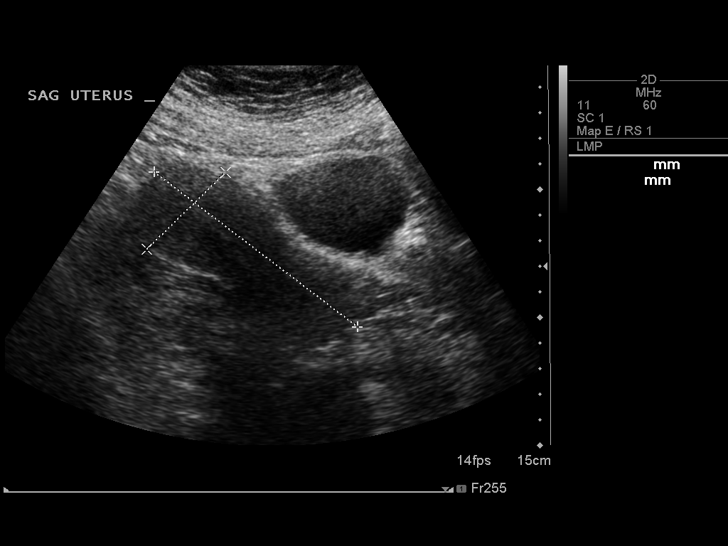
[im 5/56]
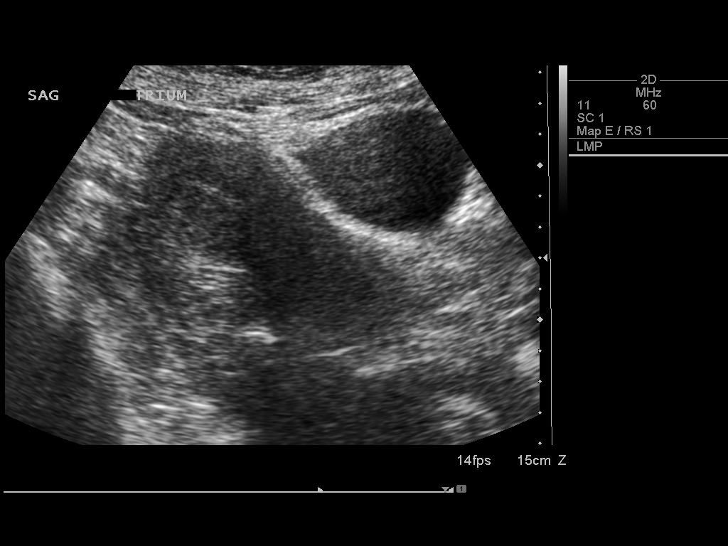
[im 10/56]
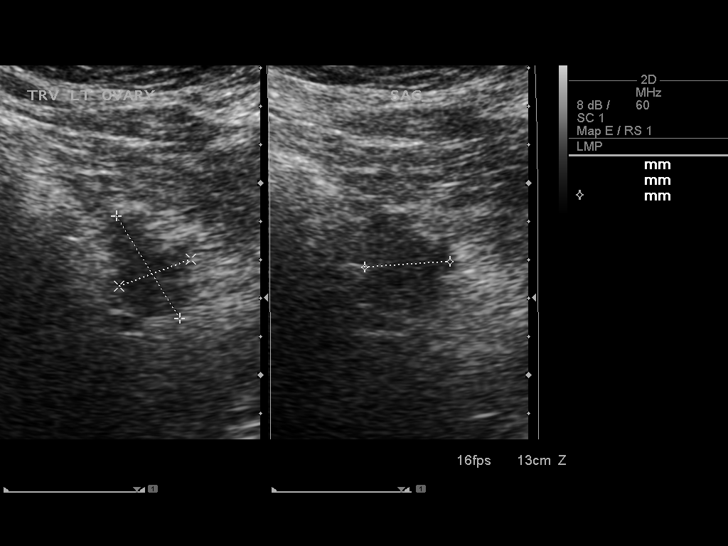
[im 14/56]
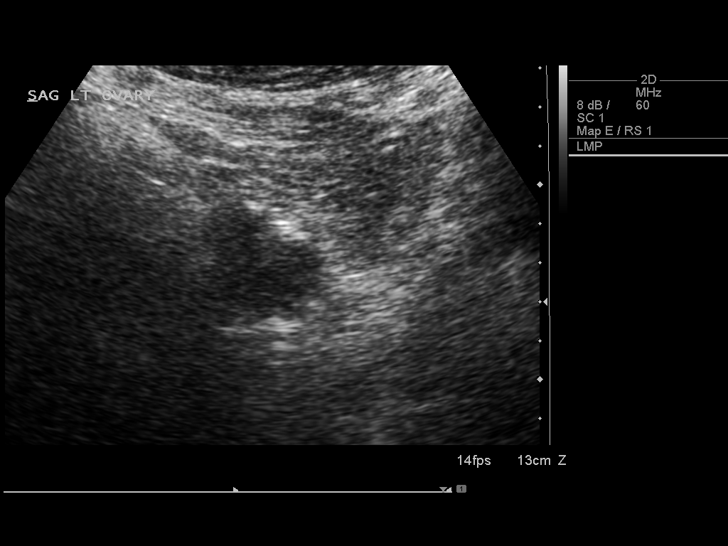
[im 19/56]
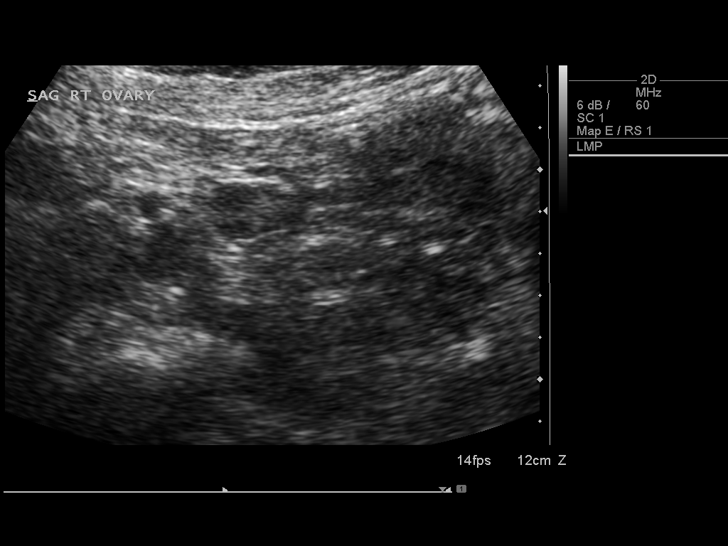
[im 23/56]
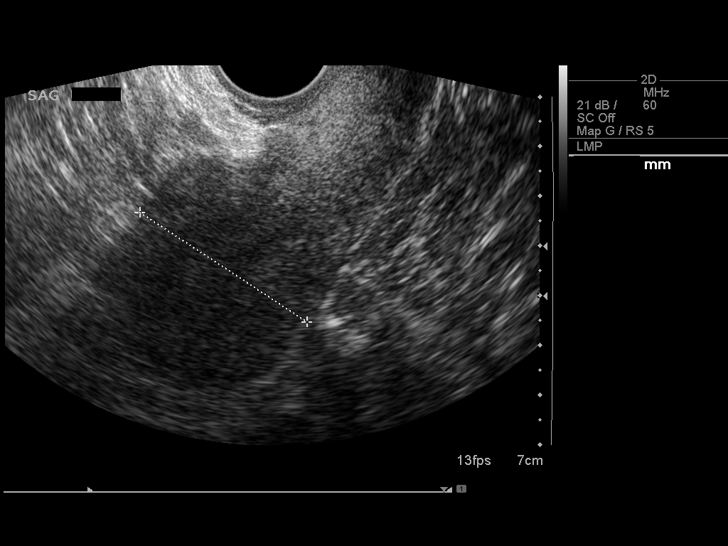
[im 28/56]
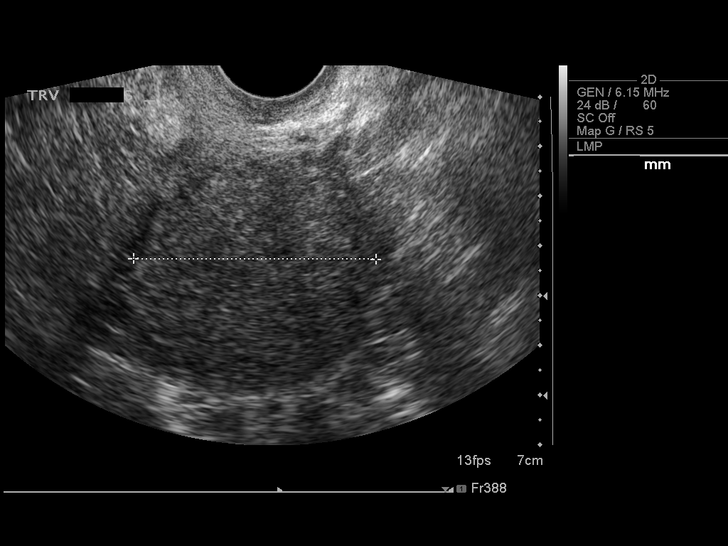
[im 33/56]
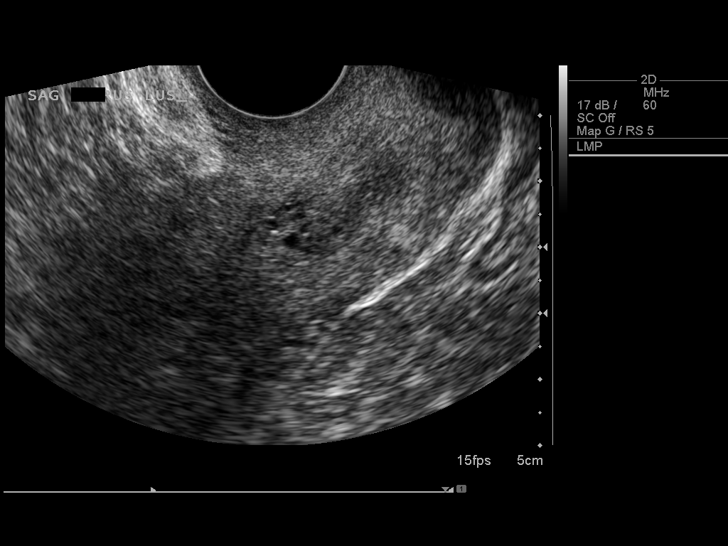
[im 37/56]
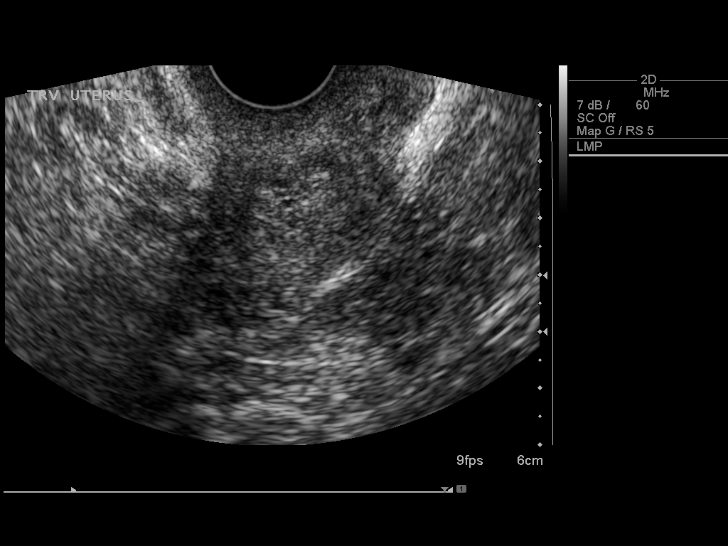
[im 42/56]
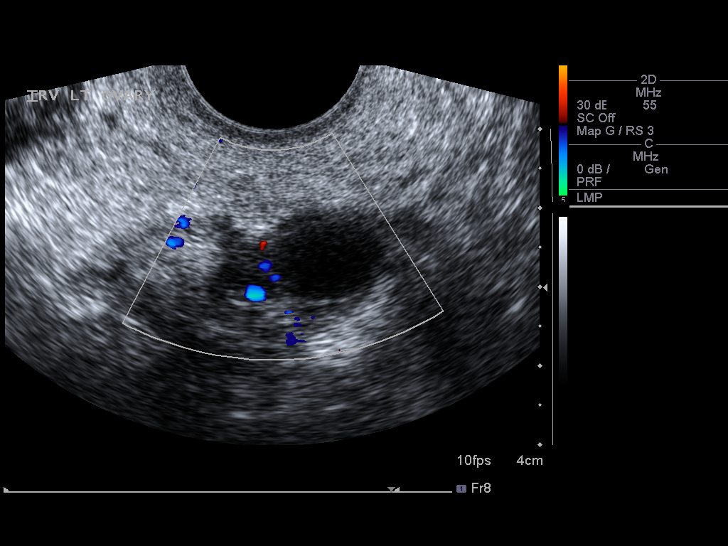
[im 46/56]
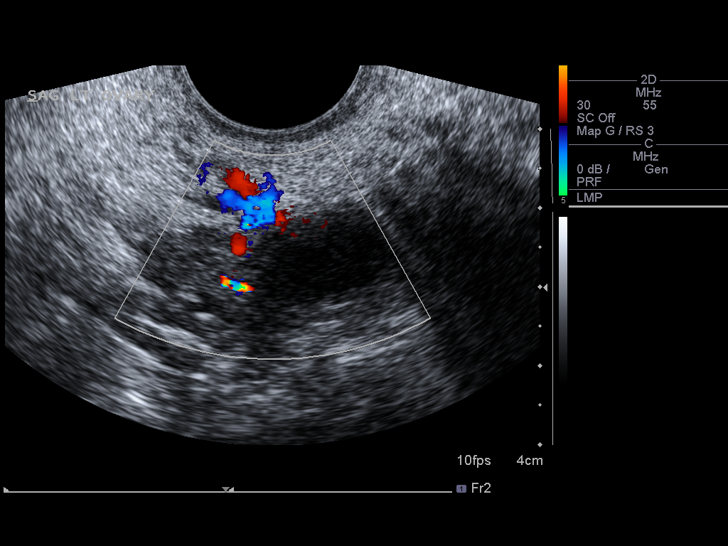
[im 51/56]
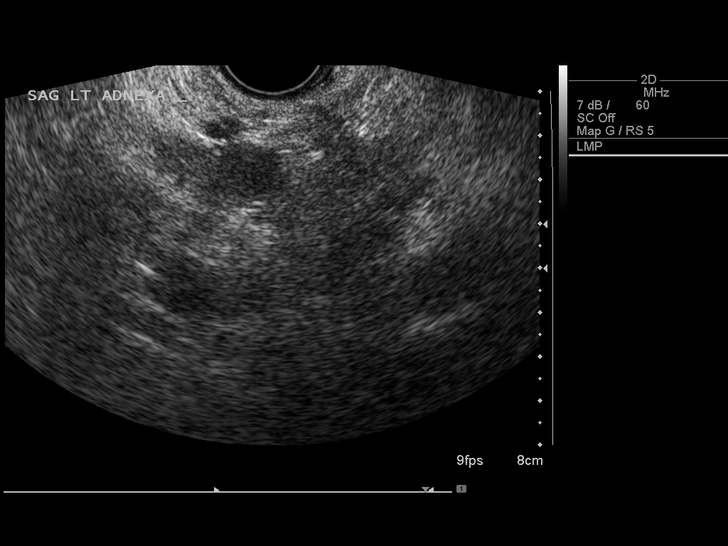
[im 56/56]
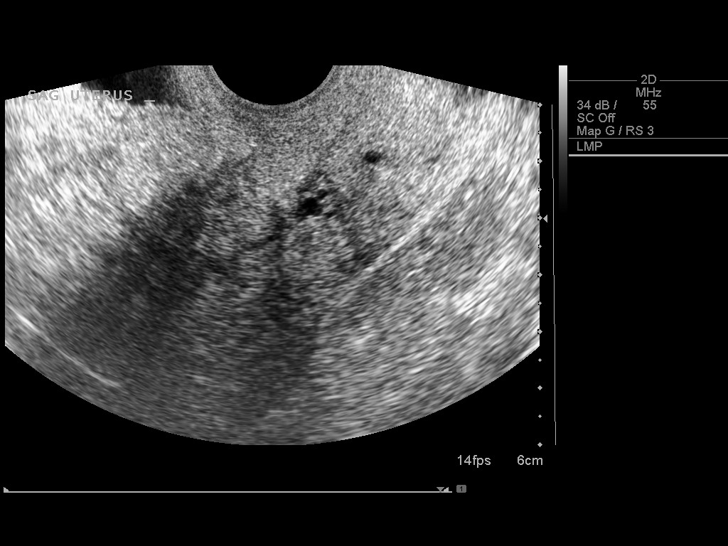

[13 of 25 positions shown; findings below may reference images not displayed]

FINDINGS: Uterus

Measurements: 8.8 x 4.0 x 4.9 cm. No fibroids or other mass
visualized.

Endometrium

Thickness: 4 mm.  No focal abnormality visualized.

Right ovary

Measurements: 2.3 x 1.4 x 2.2 cm. Normal appearance/no adnexal mass.

Left ovary

Measurements: 3.3 x 1.7 x 2.1 cm. A small cyst with fine reticular
pattern of internal echoes is seen measuring 1.4 x 1.2 x 1.0 cm. No
internal blood flow seen on color Doppler ultrasound. This likely
represents a small hemorrhagic cyst, although endometrioma and
benign cystic neoplasm could have similar appearances.

Other findings

No abnormal free fluid.
IMPRESSION: 1.4 cm left ovarian cyst with indeterminate but probably benign
characteristics. Differential diagnosis includes hemorrhagic cyst,
endometrioma, and benign cystic neoplasm. Recommend continued
followup by transvaginal ultrasound in 6-12 weeks. This
recommendation follows the consensus statement: Management of
Asymptomatic Ovarian and Other Adnexal Cysts Imaged at US: Society
of Radiologists in Ultrasound Consensus Conference Statement.

## 2016-11-17 ENCOUNTER — Encounter: Payer: Self-pay | Admitting: Family Medicine

## 2016-11-17 ENCOUNTER — Ambulatory Visit (INDEPENDENT_AMBULATORY_CARE_PROVIDER_SITE_OTHER): Payer: BLUE CROSS/BLUE SHIELD | Admitting: Family Medicine

## 2016-11-17 VITALS — BP 124/86 | HR 87 | Temp 98.8°F | Resp 16 | Ht 66.93 in | Wt 231.4 lb

## 2016-11-17 DIAGNOSIS — N920 Excessive and frequent menstruation with regular cycle: Secondary | ICD-10-CM | POA: Diagnosis not present

## 2016-11-17 DIAGNOSIS — F418 Other specified anxiety disorders: Secondary | ICD-10-CM

## 2016-11-17 DIAGNOSIS — D500 Iron deficiency anemia secondary to blood loss (chronic): Secondary | ICD-10-CM | POA: Diagnosis not present

## 2016-11-17 LAB — POCT CBC
Granulocyte percent: 61.9 %G (ref 37–80)
HEMATOCRIT: 34 % — AB (ref 37.7–47.9)
HEMOGLOBIN: 10.9 g/dL — AB (ref 12.2–16.2)
Lymph, poc: 1.9 (ref 0.6–3.4)
MCH: 22.2 pg — AB (ref 27–31.2)
MCHC: 32 g/dL (ref 31.8–35.4)
MCV: 69.3 fL — AB (ref 80–97)
MID (cbc): 0.3 (ref 0–0.9)
MPV: 8.5 fL (ref 0–99.8)
PLATELET COUNT, POC: 341 10*3/uL (ref 142–424)
POC Granulocyte: 3.6 (ref 2–6.9)
POC LYMPH %: 33.5 % (ref 10–50)
POC MID %: 4.6 % (ref 0–12)
RBC: 4.91 M/uL (ref 4.04–5.48)
RDW, POC: 17.2 %
WBC: 5.8 10*3/uL (ref 4.6–10.2)

## 2016-11-17 MED ORDER — LORAZEPAM 0.5 MG PO TABS: ORAL_TABLET | ORAL | 1 refills | Status: DC

## 2016-11-17 NOTE — Patient Instructions (Addendum)
Component     Latest Ref Rng & Units 11/17/2015 08/10/2016 08/11/2016 11/17/2016  Hemoglobin     12.2 - 16.2 g/dL 16.1 (A) 09.6 (A) 04.5 (A) 10.9 (A)  HCT     37.7 - 47.9 % 31.6 (A) 31.2 (A) 33.3 (A) 34.0 (A)  MCV     80 - 97 fL 69.2 (A) 69.1 (A) 68.6 (A) 69.3 (A)  MCH     27 - 31.2 pg 23.1 (A) 22.1 (A) 22.0 (A) 22.2 (A)     IF you received an x-ray today, you will receive an invoice from [facility]. Please contact [facility] at [phone] with questions or concerns regarding your invoice.   IF you received labwork today, you will receive an invoice from [place]. Please contact [facility] at [phone] with questions or concerns regarding your invoice.   Our billing staff will not be able to assist you with questions regarding bills from these companies.  You will be contacted with the lab results as soon as they are available. The fastest way to get your results is to activate your My Chart account. Instructions are located on the last page of this paperwork. If you have not heard from [place] regarding the results in 2 weeks, please contact this office.    Anemia, Nonspecific Anemia is a condition in which the concentration of red blood cells or hemoglobin in the blood is below normal. Hemoglobin is a substance in red blood cells that carries oxygen to the tissues of the body. Anemia results in not enough oxygen reaching these tissues. What are the causes? Common causes of anemia include:  Excessive bleeding. Bleeding may be internal or external. This includes excessive bleeding from periods (in women) or from the intestine.  Poor nutrition.  Chronic kidney, thyroid, and liver disease.  Bone marrow disorders that decrease red blood cell production.  Cancer and treatments for cancer.  HIV, AIDS, and their treatments.  Spleen problems that increase red blood cell destruction.  Blood disorders.  Excess destruction of red blood cells due to infection, medicines,  and autoimmune disorders.  What are the signs or symptoms?  Minor weakness.  Dizziness.  Headache.  Palpitations.  Shortness of breath, especially with exercise.  Paleness.  Cold sensitivity.  Indigestion.  Nausea.  Difficulty sleeping.  Difficulty concentrating. Symptoms may occur suddenly or they may develop slowly. How is this diagnosed? Additional blood tests are often needed. These help your health care provider determine the best treatment. Your health care provider will check your stool for blood and look for other causes of blood loss. How is this treated? Treatment varies depending on the cause of the anemia. Treatment can include:  Supplements of iron, vitamin B12, or folic acid.  Hormone medicines.  A blood transfusion. This may be needed if blood loss is severe.  Hospitalization. This may be needed if there is significant continual blood loss.  Dietary changes.  Spleen removal.  Follow these instructions at home: Keep all follow-up appointments. It often takes many weeks to correct anemia, and having your health care provider check on your condition and your response to treatment is very important. Get help right away if:  You develop extreme weakness, shortness of breath, or chest pain.  You become dizzy or have trouble concentrating.  You develop heavy vaginal bleeding.  You develop a rash.  You have bloody or black, tarry stools.  You faint.  You vomit up blood.  You vomit repeatedly.  You have abdominal pain.  You have a fever or persistent symptoms for more than 2-3 days.  You have a fever and your symptoms suddenly get worse.  You are dehydrated. This information is not intended to replace advice given to you by your health care provider. Make sure you discuss any questions you have with your health care provider. Document Released: 05/06/2004 Document Revised: 09/10/2015 Document Reviewed: 09/22/2012 Elsevier Interactive  Patient Education  2017 [employer]

## 2016-11-17 NOTE — Progress Notes (Signed)
Chief Complaint  Patient presents with  . Follow-up    3 mos f/u anemia    HPI   Anemia: Patient presents for presents evaluation of anemia. Anemia was found by evaluation for dizziness.   Pt reports that she had two months without a period which seemed to help her fatigue and dizziness.  Her gynecologist discussed endometrial ablation or hysterectomy.  She is taking ferrous sulfate She is taking one in the morning and one at night.  She reports that she would get bowel irritation She states that she mainly had constipation. She reports that she takes a multivitamin with iron.   Situational Anxiety Pt reports that when he husband gets sick she gets very stressed and anxious She uses prn ativan She does not have daily anxiety   Past Medical History:  Diagnosis Date  . Allergy   . Anemia   . Anemia   . Anxiety   . Asthma   . Chronic headache   . Depression   . Hypertension   . IBS (irritable bowel syndrome)   . Kidney stones   . Nephrolithiasis   . Obesity   . Seizures (HCC)     Current Outpatient Prescriptions  Medication Sig Dispense Refill  . albuterol (PROVENTIL HFA;VENTOLIN HFA) 108 (90 Base) MCG/ACT inhaler Inhale 2 puffs into the lungs every 6 (six) hours as needed for wheezing or shortness of breath. 1 Inhaler 2  . EPINEPHrine (EPIPEN) 0.3 mg/0.3 mL SOAJ injection Inject 0.3 mLs (0.3 mg total) into the muscle once. 2 Device 2  . Ferrous Sulfate 142 (45 Fe) MG TBCR Take 1 tablet by mouth daily. 30 tablet 0  . Fluticasone Furoate-Vilanterol (BREO ELLIPTA) 100-25 MCG/INH AEPB Inhale into the lungs.    Marland Kitchen. ibuprofen (ADVIL,MOTRIN) 200 MG tablet Take 200 mg by mouth every 6 (six) hours as needed.    Marland Kitchen. levofloxacin (LEVAQUIN) 500 MG tablet Take 1.5 tablets (750 mg total) by mouth daily. 5 tablet 0  . LORazepam (ATIVAN) 0.5 MG tablet Take 1/2-1 tab PO BID prn 30 tablet 1  . potassium chloride SA (K-DUR,KLOR-CON) 20 MEQ tablet Take 1 tablet (20 mEq total) by mouth daily.  30 tablet 0  . ranitidine (ZANTAC) 150 MG tablet Take 150 mg by mouth as needed for heartburn.     Current Facility-Administered Medications  Medication Dose Route Frequency Provider Last Rate Last Dose  . sodium chloride 0.9 % bolus 1,000 mL  1,000 mL Intravenous Once Doristine BosworthStallings, Hailley Byers A, MD        Allergies:  Allergies  Allergen Reactions  . Amoxicillin     Hives with first dose  . Codeine     REACTION: itching    Past Surgical History:  Procedure Laterality Date  . BREAST SURGERY    . TONSILLECTOMY  1987    Social History   Social History  . Marital status: Married    Spouse name: N/A  . Number of children: N/A  . Years of education: N/A   Social History Main Topics  . Smoking status: Never Smoker  . Smokeless tobacco: Never Used  . Alcohol use No  . Drug use: No  . Sexual activity: No   Other Topics Concern  . None   Social History Narrative  . None    Review of Systems  Constitutional: Negative for chills, fever and weight loss.  Respiratory: Negative for cough, shortness of breath and wheezing.   Cardiovascular: Negative for chest pain and palpitations.  Gastrointestinal: Negative for  abdominal pain, nausea and vomiting.  Neurological: Negative for dizziness, tingling and headaches.    Objective: Vitals:   11/17/16 0814  BP: 124/86  Pulse: 87  Resp: 16  Temp: 98.8 F (37.1 C)  TempSrc: Oral  SpO2: 98%  Weight: 231 lb 6.4 oz (105 kg)  Height: 5' 6.93" (1.7 m)   Wt Readings from Last 3 Encounters:  11/17/16 231 lb 6.4 oz (105 kg)  08/11/16 228 lb 9.6 oz (103.7 kg)  08/10/16 230 lb (104.3 kg)    Physical Exam  Constitutional: She is oriented to person, place, and time. She appears well-developed and well-nourished.  HENT:  Head: Normocephalic and atraumatic.  Eyes: EOM are normal. No scleral icterus.  Pale conjunctiva  Cardiovascular: Normal rate, regular rhythm and normal heart sounds.  Exam reveals no friction rub.   Pulmonary/Chest:  Effort normal and breath sounds normal. No respiratory distress.  Neurological: She is alert and oriented to person, place, and time.  Psychiatric: She has a normal mood and affect. Her behavior is normal. Judgment and thought content normal.   Component     Latest Ref Rng & Units 11/17/2015 08/10/2016 08/11/2016 11/17/2016  Hemoglobin     12.2 - 16.2 g/dL 16.1 (A) 09.6 (A) 04.5 (A) 10.9 (A)  HCT     37.7 - 47.9 % 31.6 (A) 31.2 (A) 33.3 (A) 34.0 (A)  MCV     80 - 97 fL 69.2 (A) 69.1 (A) 68.6 (A) 69.3 (A)  MCH     27 - 31.2 pg 23.1 (A) 22.1 (A) 22.0 (A) 22.2 (A)   Assessment and Plan Sylvia Cantrell was seen today for follow-up.  Diagnoses and all orders for this visit:  Menorrhagia with regular cycle- continue with Gynecology to discuss options -     POCT CBC  Anemia due to chronic blood loss- since pt is taking iron bid and there has been no improvement then would recommend talking to [facility] about treating the underlying problem. Continue iron supplement  -     POCT CBC -     Ferritin  Situational anxiety- refilled ativan prn which patient takes for situational anxiety 30 tabs with one refill lasts her a year   Sylvia Cantrell A Creta Levin

## 2016-11-18 LAB — FERRITIN: Ferritin: 5 ng/mL — ABNORMAL LOW (ref 15–150)

## 2016-12-31 ENCOUNTER — Encounter: Payer: Self-pay | Admitting: Internal Medicine

## 2017-02-23 ENCOUNTER — Encounter: Payer: Self-pay | Admitting: Hematology and Oncology

## 2017-02-23 ENCOUNTER — Ambulatory Visit: Payer: BLUE CROSS/BLUE SHIELD | Admitting: Family Medicine

## 2017-02-23 ENCOUNTER — Other Ambulatory Visit: Payer: Self-pay

## 2017-02-23 ENCOUNTER — Telehealth: Payer: Self-pay

## 2017-02-23 ENCOUNTER — Encounter: Payer: Self-pay | Admitting: Family Medicine

## 2017-02-23 VITALS — BP 116/80 | HR 105 | Temp 99.0°F | Resp 17 | Ht 66.93 in | Wt 236.6 lb

## 2017-02-23 DIAGNOSIS — D649 Anemia, unspecified: Secondary | ICD-10-CM | POA: Diagnosis not present

## 2017-02-23 DIAGNOSIS — D500 Iron deficiency anemia secondary to blood loss (chronic): Secondary | ICD-10-CM | POA: Diagnosis not present

## 2017-02-23 DIAGNOSIS — N920 Excessive and frequent menstruation with regular cycle: Secondary | ICD-10-CM

## 2017-02-23 LAB — POCT CBC
Granulocyte percent: 63.8 %G (ref 37–80)
HCT, POC: 28.2 % — AB (ref 37.7–47.9)
HEMOGLOBIN: 8.9 g/dL — AB (ref 12.2–16.2)
Lymph, poc: 1.5 (ref 0.6–3.4)
MCH: 21.4 pg — AB (ref 27–31.2)
MCHC: 31.7 g/dL — AB (ref 31.8–35.4)
MCV: 67.6 fL — AB (ref 80–97)
MID (CBC): 0.3 (ref 0–0.9)
MPV: 6.9 fL (ref 0–99.8)
POC GRANULOCYTE: 3.3 (ref 2–6.9)
POC LYMPH PERCENT: 30.3 %L (ref 10–50)
POC MID %: 5.9 % (ref 0–12)
Platelet Count, POC: 306 10*3/uL (ref 142–424)
RBC: 4.17 M/uL (ref 4.04–5.48)
RDW, POC: 16.8 %
WBC: 5.1 10*3/uL (ref 4.6–10.2)

## 2017-02-23 NOTE — Patient Instructions (Addendum)
  Continue your iron. Will call Hematology right away and set up appointment If you get dizzy or sweaty or racing heart go to the nearest ER    IF you received an x-ray today, you will receive an invoice from [facility]. Please contact [facility] at [phone] with questions or concerns regarding your invoice.   IF you received labwork today, you will receive an invoice from [facility]. Please contact [facility] at [phone] with questions or concerns regarding your invoice.   Our billing staff will not be able to assist you with questions regarding bills from these companies.  You will be contacted with the lab results as soon as they are available. The fastest way to get your results is to activate your My Chart account. Instructions are located on the last page of this paperwork. If you have not heard from us regarding the results in 2 weeks, please contact this office.

## 2017-02-23 NOTE — Progress Notes (Addendum)
Chief Complaint  Patient presents with  . 3 month f/u anemia    HPI  Patient reports that she has been taking her iron daily She saw Gynecology who discussed minimally invasive procedures and hysterectomy She states that due to cost and insurance she cannot do the procedures She tried Depo in the past but her mood was suppressed with depression that was very difficult Her current OCP is not lessening her periods She takes the slow fe 1 tab in the morning and 1 tab in the evening to cut down on her nausea and constipation She still has a 10 day period and has heavy bleeding for 3 days straight of heavy clotting and heavy bleeding. She has palpitations and fatigue but reports that the palpitations are more pronounced if she tries to exercise.       4 review of systems  Past Medical History:  Diagnosis Date  . Allergy   . Anemia   . Anemia   . Anxiety   . Asthma   . Chronic headache   . Depression   . Hypertension   . IBS (irritable bowel syndrome)   . Kidney stones   . Nephrolithiasis   . Obesity   . Seizures (HCC)     Current Outpatient Medications  Medication Sig Dispense Refill  . albuterol (PROVENTIL HFA;VENTOLIN HFA) 108 (90 Base) MCG/ACT inhaler Inhale 2 puffs into the lungs every 6 (six) hours as needed for wheezing or shortness of breath. 1 Inhaler 2  . EPINEPHrine (EPIPEN) 0.3 mg/0.3 mL SOAJ injection Inject 0.3 mLs (0.3 mg total) into the muscle once. 2 Device 2  . Ferrous Sulfate 142 (45 Fe) MG TBCR Take 1 tablet by mouth daily. 30 tablet 0  . Fluticasone Furoate-Vilanterol (BREO ELLIPTA) 100-25 MCG/INH AEPB Inhale into the lungs.    Marland Kitchen ibuprofen (ADVIL,MOTRIN) 200 MG tablet Take 200 mg by mouth every 6 (six) hours as needed.    Marland Kitchen LORazepam (ATIVAN) 0.5 MG tablet Take 1/2-1 tab PO BID prn 30 tablet 1  . ranitidine (ZANTAC) 150 MG tablet Take 150 mg by mouth as needed for heartburn.    . levofloxacin (LEVAQUIN) 500 MG tablet Take 1.5 tablets (750 mg total) by  mouth daily. (Patient not taking: Reported on 02/23/2017) 5 tablet 0  . potassium chloride SA (K-DUR,KLOR-CON) 20 MEQ tablet Take 1 tablet (20 mEq total) by mouth daily. (Patient not taking: Reported on 02/23/2017) 30 tablet 0   Current Facility-Administered Medications  Medication Dose Route Frequency Provider Last Rate Last Dose  . sodium chloride 0.9 % bolus 1,000 mL  1,000 mL Intravenous Once Doristine Bosworth, MD        Allergies:  Allergies  Allergen Reactions  . Amoxicillin     Hives with first dose  . Codeine     REACTION: itching    Past Surgical History:  Procedure Laterality Date  . BREAST SURGERY    . TONSILLECTOMY  1987    Social History   Socioeconomic History  . Marital status: Married    Spouse name: None  . Number of children: None  . Years of education: None  . Highest education level: None  Social Needs  . Financial resource strain: None  . Food insecurity - worry: None  . Food insecurity - inability: None  . Transportation needs - medical: None  . Transportation needs - non-medical: None  Occupational History  . None  Tobacco Use  . Smoking status: Never Smoker  . Smokeless tobacco: Never  Used  Substance and Sexual Activity  . Alcohol use: No    Alcohol/week: 0.0 oz  . Drug use: No  . Sexual activity: No  Other Topics Concern  . None  Social History Narrative  . None    Family History  Problem Relation Age of Onset  . Hypertension Mother   . Hyperthyroidism Mother   . Heart failure Father   . Heart failure Maternal Grandmother   . Hypertension Maternal Grandmother   . Kidney disease Maternal Grandmother   . Heart failure Paternal Grandfather      ROS Review of Systems See HPI Constitution: No fevers or chills +FATIGUE No diaphoresis Skin: No rash or itching Eyes: no blurry vision, no double vision GU: no dysuria or hematuria Neuro: no dizziness or headaches    Objective: Vitals:   02/23/17 0857  BP: 116/80  Pulse: (!)  105  Resp: 17  Temp: 99 F (37.2 C)  TempSrc: Oral  SpO2: 98%  Weight: 236 lb 9.6 oz (107.3 kg)  Height: 5' 6.93" (1.7 m)    Physical Exam  Constitutional: She is oriented to person, place, and time. She appears well-developed and well-nourished.  HENT:  Head: Normocephalic and atraumatic.  Eyes: Conjunctivae and EOM are normal.  Conjunctiva pale  Cardiovascular: Normal rate, regular rhythm and normal heart sounds.  Pulmonary/Chest: Effort normal and breath sounds normal. No stridor. No respiratory distress.  Neurological: She is alert and oriented to person, place, and time.  Skin: Skin is warm. Capillary refill takes less than 2 seconds.  Psychiatric: She has a normal mood and affect. Her behavior is normal. Judgment and thought content normal.   Component     Latest Ref Rng & Units 08/11/2016 11/17/2016 02/23/2017  WBC     4.6 - 10.2 K/uL 3.7 (A) 5.8 5.1  Lymph, poc     0.6 - 3.4 1.9 1.9 1.5  POC LYMPH PERCENT     10 - 50 %L 50.7 (A) 33.5 30.3  MID (cbc)     0 - 0.9 0.2 0.3 0.3  POC MID %     0 - 12 %M 5.2 4.6 5.9  POC Granulocyte     2 - 6.9 1.6 (A) 3.6 3.3  Granulocyte percent     37 - 80 %G 44.1 61.9 63.8  RBC     4.04 - 5.48 M/uL 4.86 4.91 4.17  Hemoglobin     12.2 - 16.2 g/dL 16.110.7 (A) 09.610.9 (A) 8.9 (A)  HCT     37.7 - 47.9 % 33.3 (A) 34.0 (A) 28.2 (A)  MCV     80 - 97 fL 68.6 (A) 69.3 (A) 67.6 (A)  MCH     27 - 31.2 pg 22.0 (A) 22.2 (A) 21.4 (A)  MCHC     31.8 - 35.4 g/dL 04.532.1 40.932.0 81.131.7 (A)  RDW, POC     % 18.2 17.2 16.8  Platelet Count, POC     142 - 424 K/uL 301 341 306  MPV     0 - 99.8 fL 9.0 8.5 6.9   Assessment and Plan Sylvia Cantrell was seen today for 3 month f/u anemia.  Diagnoses and all orders for this visit:  Anemia due to chronic blood loss -     POCT CBC -     Cancel: Ambulatory referral to Hematology -     Ambulatory referral to Hematology  Menorrhagia with regular cycle -     POCT CBC -     Cancel:  Ambulatory referral to Hematology -      Ambulatory referral to Hematology  Symptomatic anemia  Other orders -     Cancel: Flu Vaccine QUAD 36+ mos IM   Will try to get patient in to Hematology with stat referral Discussed that she may need a transfusion if she has continued symptoms Discussed long term implications for her heart She should consider financing to get ablation or pursue other options to treat the underlying problem She was scheduled with Dr. Pamelia HoitGudena 02/28/2017 at 1pm  Sylvia Cantrell

## 2017-02-23 NOTE — Telephone Encounter (Signed)
Spoke with patient an advised appt with Dr Pamelia HoitGudena on Monday November 19th, 2018 at 1 pm [facility].  Pt agreeable with appt date and time.

## 2017-02-28 ENCOUNTER — Telehealth: Payer: Self-pay | Admitting: Internal Medicine

## 2017-02-28 ENCOUNTER — Ambulatory Visit (HOSPITAL_BASED_OUTPATIENT_CLINIC_OR_DEPARTMENT_OTHER): Payer: BLUE CROSS/BLUE SHIELD | Admitting: Hematology and Oncology

## 2017-02-28 DIAGNOSIS — D500 Iron deficiency anemia secondary to blood loss (chronic): Secondary | ICD-10-CM

## 2017-02-28 DIAGNOSIS — N920 Excessive and frequent menstruation with regular cycle: Secondary | ICD-10-CM

## 2017-02-28 NOTE — Progress Notes (Signed)
[facility] CONSULT NOTE  Patient Care Team: Doristine BosworthStallings, Zoe A, MD as PCP - General (Internal Medicine)  CHIEF COMPLAINTS/PURPOSE OF CONSULTATION:  Profound iron deficiency anemia due to heavy menstrual bleeding  HISTORY OF PRESENTING ILLNESS:  Sylvia Cantrell 41 y.o. female is here because of long-standing history of iron deficiency anemia.  Patient has had heavy menstrual cycles since she had her son almost 5 years ago.  She was initially placed on Mirena but it was removed.  Since that time onwards of bleeding symptoms have increased.  She has been on oral iron therapy intermittently for the past couple of years.  Over the past 3 months he has been taking it regularly.  Most recent blood work showed a drop in hemoglobin to 8.9.  Because of this she was referred to us for evaluation.  I reviewed her records extensively and collaborated the history with the patient.   MEDICAL HISTORY:  Past Medical History:  Diagnosis Date  . Allergy   . Anemia   . Anemia   . Anxiety   . Asthma   . Chronic headache   . Depression   . Hypertension   . IBS (irritable bowel syndrome)   . Kidney stones   . Nephrolithiasis   . Obesity   . Seizures (HCC)     SURGICAL HISTORY: Past Surgical History:  Procedure Laterality Date  . BREAST SURGERY    . TONSILLECTOMY  1987    SOCIAL HISTORY: Social History   Socioeconomic History  . Marital status: Married    Spouse name: Not on file  . Number of children: Not on file  . Years of education: Not on file  . Highest education level: Not on file  Social Needs  . Financial resource strain: Not on file  . Food insecurity - worry: Not on file  . Food insecurity - inability: Not on file  . Transportation needs - medical: Not on file  . Transportation needs - non-medical: Not on file  Occupational History  . Not on file  Tobacco Use  . Smoking status: Never Smoker  . Smokeless tobacco: Never Used  Substance and Sexual Activity  .  Alcohol use: No    Alcohol/week: 0.0 oz  . Drug use: No  . Sexual activity: No  Other Topics Concern  . Not on file  Social History Narrative  . Not on file    FAMILY HISTORY: Family History  Problem Relation Age of Onset  . Hypertension Mother   . Hyperthyroidism Mother   . Heart failure Father   . Heart failure Maternal Grandmother   . Hypertension Maternal Grandmother   . Kidney disease Maternal Grandmother   . Heart failure Paternal Grandfather     ALLERGIES:  is allergic to amoxicillin and codeine.  MEDICATIONS:  Current Outpatient Medications  Medication Sig Dispense Refill  . albuterol (PROVENTIL HFA;VENTOLIN HFA) 108 (90 Base) MCG/ACT inhaler Inhale 2 puffs into the lungs every 6 (six) hours as needed for wheezing or shortness of breath. 1 Inhaler 2  . EPINEPHrine (EPIPEN) 0.3 mg/0.3 mL SOAJ injection Inject 0.3 mLs (0.3 mg total) into the muscle once. 2 Device 2  . Ferrous Sulfate 142 (45 Fe) MG TBCR Take 1 tablet by mouth daily. 30 tablet 0  . Fluticasone Furoate-Vilanterol (BREO ELLIPTA) 100-25 MCG/INH AEPB Inhale into the lungs.    Marland Kitchen. ibuprofen (ADVIL,MOTRIN) 200 MG tablet Take 200 mg by mouth every 6 (six) hours as needed.    Marland Kitchen. levofloxacin (LEVAQUIN)  500 MG tablet Take 1.5 tablets (750 mg total) by mouth daily. (Patient not taking: Reported on 02/23/2017) 5 tablet 0  . LORazepam (ATIVAN) 0.5 MG tablet Take 1/2-1 tab PO BID prn 30 tablet 1  . potassium chloride SA (K-DUR,KLOR-CON) 20 MEQ tablet Take 1 tablet (20 mEq total) by mouth daily. (Patient not taking: Reported on 02/23/2017) 30 tablet 0  . ranitidine (ZANTAC) 150 MG tablet Take 150 mg by mouth as needed for heartburn.     Current Facility-Administered Medications  Medication Dose Route Frequency Provider Last Rate Last Dose  . sodium chloride 0.9 % bolus 1,000 mL  1,000 mL Intravenous Once Collie SiadStallings, Zoe A, MD        REVIEW OF SYSTEMS:   Constitutional: Denies fevers, chills or abnormal night sweats,  craving for ice chips Eyes: Denies blurriness of vision, double vision or watery eyes Ears, nose, mouth, throat, and face: Denies mucositis or sore throat Respiratory: Denies cough, dyspnea or wheezes Cardiovascular: Denies palpitation, chest discomfort or lower extremity swelling Gastrointestinal:  Denies nausea, heartburn or change in bowel habits Skin: Denies abnormal skin rashes Lymphatics: Denies new lymphadenopathy or easy bruising Neurological:Denies numbness, tingling or new weaknesses Behavioral/Psych: Mood is stable, no new changes   All other systems were reviewed with the patient and are negative.  PHYSICAL EXAMINATION: ECOG PERFORMANCE STATUS: 2 - Symptomatic, <50% confined to bed  Vitals:   02/28/17 1257  BP: (!) 147/87  Pulse: 88  Resp: 18  Temp: 98.4 F (36.9 C)  SpO2: 99%   Filed Weights   02/28/17 1257  Weight: 236 lb 11.2 oz (107.4 kg)    GENERAL:alert, no distress and comfortable SKIN: skin color, texture, turgor are normal, no rashes or significant lesions EYES: normal, conjunctiva are pink and non-injected, sclera clear OROPHARYNX:no exudate, no erythema and lips, buccal mucosa, and tongue normal  NECK: supple, thyroid normal size, non-tender, without nodularity LYMPH:  no palpable lymphadenopathy in the cervical, axillary or inguinal LUNGS: clear to auscultation and percussion with normal breathing effort HEART: regular rate & rhythm and no murmurs and no lower extremity edema ABDOMEN:abdomen soft, non-tender and normal bowel sounds Musculoskeletal:no cyanosis of digits and no clubbing  PSYCH: alert & oriented x 3 with fluent speech NEURO: no focal motor/sensory deficits   LABORATORY DATA:  I have reviewed the data as listed Lab Results  Component Value Date   WBC 5.1 02/23/2017   HGB 8.9 (A) 02/23/2017   HCT 28.2 (A) 02/23/2017   MCV 67.6 (A) 02/23/2017   PLT 348 05/06/2015   Lab Results  Component Value Date   NA 140 08/10/2016   K 3.4  (L) 08/10/2016   CL 102 08/10/2016   CO2 22 08/10/2016    RADIOGRAPHIC STUDIES: I have personally reviewed the radiological reports and agreed with the findings in the report.  ASSESSMENT AND PLAN:  Iron deficiency anemia due to chronic blood loss Chronic uterine bleeding heavy periods causing severe anemia, intolerant to oral iron therapy because of abdominal pain and gastritis.  Lab review 02/23/2017: Hemoglobin 8.9, MCV 67, platelet count 306 Ferritin 11/18/2014: 5  Patient has been on oral iron therapy and her hemoglobin appears to have declined in spite of taking oral iron twice a day. Profound fatigue with hair or nail changes as well as ice cravings  Recommendation: IV iron therapy 2 doses 1 week apart Counseled her extensively regarding IV iron therapy risks and benefits. Return to clinic in 3 months with recheck of her iron studies  and CBC.   All questions were answered. The patient knows to call the clinic with any problems, questions or concerns.    Sabas Sous, MD 02/28/17

## 2017-02-28 NOTE — Assessment & Plan Note (Signed)
Chronic uterine bleeding heavy periods causing severe anemia, intolerant to oral iron therapy because of abdominal pain and gastritis.  Lab review 02/23/2017: Hemoglobin 8.9, MCV 67, platelet count 306 Ferritin 11/18/2014: 5  Patient has been on oral iron therapy and her hemoglobin appears to have declined in spite of taking oral iron twice a day. Profound fatigue with hair or nail changes as well as ice cravings  Recommendation: IV iron therapy 2 doses 1 week apart  Return to clinic in 3 months with recheck of her iron studies and CBC.

## 2017-02-28 NOTE — Telephone Encounter (Signed)
Gave avs and calendar for November and February 2019

## 2017-03-02 ENCOUNTER — Ambulatory Visit (HOSPITAL_COMMUNITY)
Admission: RE | Admit: 2017-03-02 | Discharge: 2017-03-02 | Disposition: A | Discharge status: Home / Self Care | Payer: BLUE CROSS/BLUE SHIELD | Source: Ambulatory Visit | Attending: Internal Medicine | Admitting: Internal Medicine

## 2017-03-02 DIAGNOSIS — D509 Iron deficiency anemia, unspecified: Secondary | ICD-10-CM | POA: Diagnosis not present

## 2017-03-02 MED ORDER — SODIUM CHLORIDE 0.9 % IV SOLN
510.0000 mg | Freq: Once | INTRAVENOUS | Status: AC
  Administered 2017-03-02: 510 mg via INTRAVENOUS
  Filled 2017-03-02: qty 17

## 2017-03-02 NOTE — Discharge Instructions (Signed)

## 2017-03-02 NOTE — Progress Notes (Signed)
Diagnosis: Iron deficiency  Provider: Arbutus PedMohamed  Procedure:  IV infusion of Feraheme per protocol.  VSS throughout process.  Some dizziness on completion of infusion.  Orthostatic vital signs stable. Patient ambulatory without difficulty at discharge.

## 2017-03-09 ENCOUNTER — Ambulatory Visit (HOSPITAL_COMMUNITY)
Admission: RE | Admit: 2017-03-09 | Discharge: 2017-03-09 | Disposition: A | Discharge status: Home / Self Care | Payer: BLUE CROSS/BLUE SHIELD | Source: Ambulatory Visit | Attending: Internal Medicine | Admitting: Internal Medicine

## 2017-03-09 DIAGNOSIS — D509 Iron deficiency anemia, unspecified: Secondary | ICD-10-CM | POA: Diagnosis not present

## 2017-03-09 MED ORDER — SODIUM CHLORIDE 0.9 % IV SOLN
510.0000 mg | Freq: Once | INTRAVENOUS | Status: AC
  Administered 2017-03-09: 510 mg via INTRAVENOUS
  Filled 2017-03-09: qty 17

## 2017-03-09 NOTE — Discharge Instructions (Signed)

## 2017-03-09 NOTE — Progress Notes (Signed)
Diagnosis: Iron deficiency  Provider: Arbutus PedMohamed  Procedure:  IV infusion of Feraheme per protocol. Tolerated well.   Vital signs stable. Patient ambulatory without difficulty at discharge.

## 2017-05-31 ENCOUNTER — Inpatient Hospital Stay: Payer: BLUE CROSS/BLUE SHIELD | Attending: Hematology and Oncology

## 2017-05-31 DIAGNOSIS — R531 Weakness: Secondary | ICD-10-CM | POA: Insufficient documentation

## 2017-05-31 DIAGNOSIS — Z885 Allergy status to narcotic agent status: Secondary | ICD-10-CM | POA: Diagnosis not present

## 2017-05-31 DIAGNOSIS — Z880 Allergy status to penicillin: Secondary | ICD-10-CM | POA: Insufficient documentation

## 2017-05-31 DIAGNOSIS — D500 Iron deficiency anemia secondary to blood loss (chronic): Secondary | ICD-10-CM

## 2017-05-31 DIAGNOSIS — R5383 Other fatigue: Secondary | ICD-10-CM | POA: Insufficient documentation

## 2017-05-31 DIAGNOSIS — N920 Excessive and frequent menstruation with regular cycle: Secondary | ICD-10-CM | POA: Diagnosis not present

## 2017-05-31 DIAGNOSIS — Z79899 Other long term (current) drug therapy: Secondary | ICD-10-CM | POA: Insufficient documentation

## 2017-05-31 LAB — IRON AND TIBC
Iron: 24 ug/dL — ABNORMAL LOW (ref 41–142)
SATURATION RATIOS: 7 % — AB (ref 21–57)
TIBC: 351 ug/dL (ref 236–444)
UIBC: 327 ug/dL

## 2017-05-31 LAB — CBC WITH DIFFERENTIAL/PLATELET
Basophils Absolute: 0.1 10*3/uL (ref 0.0–0.1)
Basophils Relative: 1 %
Eosinophils Absolute: 0.3 10*3/uL (ref 0.0–0.5)
Eosinophils Relative: 4 %
HEMATOCRIT: 37.3 % (ref 34.8–46.6)
HEMOGLOBIN: 12 g/dL (ref 11.6–15.9)
LYMPHS PCT: 32 %
Lymphs Abs: 2.2 10*3/uL (ref 0.9–3.3)
MCH: 26.2 pg (ref 25.1–34.0)
MCHC: 32.2 g/dL (ref 31.5–36.0)
MCV: 81.3 fL (ref 79.5–101.0)
MONOS PCT: 6 %
Monocytes Absolute: 0.4 10*3/uL (ref 0.1–0.9)
NEUTROS ABS: 3.9 10*3/uL (ref 1.5–6.5)
NEUTROS PCT: 57 %
Platelets: 253 10*3/uL (ref 145–400)
RBC: 4.59 MIL/uL (ref 3.70–5.45)
RDW: 16.9 % — ABNORMAL HIGH (ref 11.2–14.5)
WBC: 6.8 10*3/uL (ref 3.9–10.3)

## 2017-05-31 LAB — FERRITIN: Ferritin: 4 ng/mL — ABNORMAL LOW (ref 9–269)

## 2017-06-01 ENCOUNTER — Telehealth: Payer: Self-pay | Admitting: Hematology and Oncology

## 2017-06-01 ENCOUNTER — Inpatient Hospital Stay (HOSPITAL_BASED_OUTPATIENT_CLINIC_OR_DEPARTMENT_OTHER): Payer: BLUE CROSS/BLUE SHIELD | Admitting: Hematology and Oncology

## 2017-06-01 DIAGNOSIS — R5383 Other fatigue: Secondary | ICD-10-CM

## 2017-06-01 DIAGNOSIS — Z79899 Other long term (current) drug therapy: Secondary | ICD-10-CM | POA: Diagnosis not present

## 2017-06-01 DIAGNOSIS — R531 Weakness: Secondary | ICD-10-CM

## 2017-06-01 DIAGNOSIS — N920 Excessive and frequent menstruation with regular cycle: Secondary | ICD-10-CM

## 2017-06-01 DIAGNOSIS — Z880 Allergy status to penicillin: Secondary | ICD-10-CM | POA: Diagnosis not present

## 2017-06-01 DIAGNOSIS — D500 Iron deficiency anemia secondary to blood loss (chronic): Secondary | ICD-10-CM | POA: Diagnosis not present

## 2017-06-01 DIAGNOSIS — Z885 Allergy status to narcotic agent status: Secondary | ICD-10-CM

## 2017-06-01 NOTE — Assessment & Plan Note (Signed)
Chronic uterine bleeding heavy periods causing severe anemia, intolerant to oral iron therapy because of abdominal pain and gastritis.  Lab review 02/23/2017: Hemoglobin 8.9, MCV 67, platelet count 306 Ferritin 11/18/2014: 5  Patient has been on oral iron therapy and her hemoglobin appears to have declined in spite of taking oral iron twice a day. Profound fatigue with hair or nail changes as well as ice cravings  Recommendation: IV iron therapy 2 doses 1 week apart Counseled her extensively regarding IV iron therapy risks and benefits. Return to clinic in 3 months with recheck of her iron studies and CBC.

## 2017-06-01 NOTE — Progress Notes (Signed)
Patient Care Team: Doristine BosworthStallings, Zoe A, MD as PCP - General (Internal Medicine)  DIAGNOSIS:  Encounter Diagnosis  Name Primary?  . Iron deficiency anemia due to chronic blood loss    IV iron treatment November 2018 CHIEF COMPLIANT: Worsening fatigue lately after having had heavy menstrual bleeding  INTERVAL HISTORY: Sylvia Cantrell is a 42 year old with above-mentioned history of iron deficiency anemia due to blood loss from heavy menstrual bleeding.  She was given IV iron therapy November and had remarkable improvement in her energy levels and symptoms.  Recently she had another episode of heavy menstrual bleeding and since then she has felt markedly weaker and fatigued.  Today she had blood work and she is here to discuss the results.  REVIEW OF SYSTEMS:   Constitutional: Complains of severe fatigue Eyes: Denies blurriness of vision Ears, nose, mouth, throat, and face: Denies mucositis or sore throat Respiratory: Denies cough, dyspnea or wheezes Cardiovascular: Denies palpitation, chest discomfort Gastrointestinal:  Denies nausea, heartburn or change in bowel habits Skin: Denies abnormal skin rashes Lymphatics: Denies new lymphadenopathy or easy bruising Neurological:Denies numbness, tingling or new weaknesses Behavioral/Psych: Mood is stable, no new changes  Extremities: No lower extremity edema All other systems were reviewed with the patient and are negative.  I have reviewed the past medical history, past surgical history, social history and family history with the patient and they are unchanged from previous note.  ALLERGIES:  is allergic to amoxicillin and codeine.  MEDICATIONS:  Current Outpatient Medications  Medication Sig Dispense Refill  . albuterol (PROVENTIL HFA;VENTOLIN HFA) 108 (90 Base) MCG/ACT inhaler Inhale 2 puffs into the lungs every 6 (six) hours as needed for wheezing or shortness of breath. 1 Inhaler 2  . EPINEPHrine (EPIPEN) 0.3 mg/0.3 mL SOAJ injection  Inject 0.3 mLs (0.3 mg total) into the muscle once. 2 Device 2  . Ferrous Sulfate 142 (45 Fe) MG TBCR Take 1 tablet by mouth daily. 30 tablet 0  . Fluticasone Furoate-Vilanterol (BREO ELLIPTA) 100-25 MCG/INH AEPB Inhale into the lungs.    Marland Kitchen. ibuprofen (ADVIL,MOTRIN) 200 MG tablet Take 200 mg by mouth every 6 (six) hours as needed.    Marland Kitchen. levofloxacin (LEVAQUIN) 500 MG tablet Take 1.5 tablets (750 mg total) by mouth daily. (Patient not taking: Reported on 02/23/2017) 5 tablet 0  . LORazepam (ATIVAN) 0.5 MG tablet Take 1/2-1 tab PO BID prn 30 tablet 1  . potassium chloride SA (K-DUR,KLOR-CON) 20 MEQ tablet Take 1 tablet (20 mEq total) by mouth daily. (Patient not taking: Reported on 02/23/2017) 30 tablet 0  . ranitidine (ZANTAC) 150 MG tablet Take 150 mg by mouth as needed for heartburn.     Current Facility-Administered Medications  Medication Dose Route Frequency Provider Last Rate Last Dose  . sodium chloride 0.9 % bolus 1,000 mL  1,000 mL Intravenous Once Creta LevinStallings, Zoe A, MD        PHYSICAL EXAMINATION: ECOG PERFORMANCE STATUS: 1 - Symptomatic but completely ambulatory  Vitals:   06/01/17 0837  BP: (!) 139/92  Pulse: 91  Resp: 17  Temp: 98.4 F (36.9 C)  SpO2: 99%   Filed Weights   06/01/17 0837  Weight: 235 lb 14.4 oz (107 kg)    GENERAL:alert, no distress and comfortable SKIN: skin color, texture, turgor are normal, no rashes or significant lesions EYES: normal, Conjunctiva are pink and non-injected, sclera clear OROPHARYNX:no exudate, no erythema and lips, buccal mucosa, and tongue normal  NECK: supple, thyroid normal size, non-tender, without nodularity LYMPH:  no  palpable lymphadenopathy in the cervical, axillary or inguinal LUNGS: clear to auscultation and percussion with normal breathing effort HEART: regular rate & rhythm and no murmurs and no lower extremity edema ABDOMEN:abdomen soft, non-tender and normal bowel sounds MUSCULOSKELETAL:no cyanosis of digits and no  clubbing  NEURO: alert & oriented x 3 with fluent speech, no focal motor/sensory deficits EXTREMITIES: No lower extremity edema  LABORATORY DATA:  I have reviewed the data as listed CMP Latest Ref Rng & Units 08/10/2016 05/06/2015 12/21/2013  Glucose 65 - 99 mg/dL 098(J) 74 191(Y)  BUN 6 - 24 mg/dL 10 10 9   Creatinine 0.57 - 1.00 mg/dL 7.82 9.56 2.13  Sodium 134 - 144 mmol/L 140 141 138  Potassium 3.5 - 5.2 mmol/L 3.4(L) 3.9 4.1  Chloride 96 - 106 mmol/L 102 107 105  CO2 18 - 29 mmol/L 22 26 27   Calcium 8.7 - 10.2 mg/dL 8.9 9.0 9.0  Total Protein 6.1 - 8.1 g/dL - 6.8 6.6  Total Bilirubin 0.2 - 1.2 mg/dL - 0.3 0.3  Alkaline Phos 33 - 115 U/L - 89 85  AST 10 - 30 U/L - 13 14  ALT 6 - 29 U/L - 14 19    Lab Results  Component Value Date   WBC 6.8 05/31/2017   HGB 12.0 05/31/2017   HCT 37.3 05/31/2017   MCV 81.3 05/31/2017   PLT 253 05/31/2017   NEUTROABS 3.9 05/31/2017    ASSESSMENT & PLAN:  Iron deficiency anemia due to chronic blood loss Chronic uterine bleeding heavy periods causing severe anemia, intolerant to oral iron therapy because of abdominal pain and gastritis.  Lab review 05/31/2017: Hemoglobin 12 improved from 8.9, MCV 81 improved from 67 Ferritin 05/31/2017: 5  Patient has been on oral iron therapy and her hemoglobin appears to have declined in spite of taking oral iron twice a day. Profound fatigue with hair or nail changes as well as ice cravings  Recommendation: IV iron therapy 2 doses 1 week apart I instructed her to discuss with her gynecologist about treatment options to stop the bleeding. She tells me that all the treatment options are very expensive based on her high deductible insurance.  Return to clinic in 3 months with recheck of her iron studies and CBC.  I spent 25 minutes talking to the patient of which more than half was spent in counseling and coordination of care.  Orders Placed This Encounter  Procedures  . Iron and TIBC    Standing  Status:   Future    Standing Expiration Date:   06/01/2018  . Ferritin    Standing Status:   Future    Standing Expiration Date:   06/01/2018  . CBC with Differential (Cancer Center Only)    Standing Status:   Future    Standing Expiration Date:   06/01/2018   The patient has a good understanding of the overall plan. she agrees with it. she will call with any problems that may develop before the next visit here.   Tamsen Meek, MD 06/01/17

## 2017-06-01 NOTE — Telephone Encounter (Signed)
Gave patient AVs and calendar of upcoming March and June appointments °

## 2017-06-06 ENCOUNTER — Encounter (HOSPITAL_COMMUNITY): Payer: BLUE CROSS/BLUE SHIELD

## 2017-06-13 ENCOUNTER — Inpatient Hospital Stay: Payer: BLUE CROSS/BLUE SHIELD | Attending: Hematology and Oncology

## 2017-06-13 VITALS — BP 133/79 | HR 78 | Temp 98.5°F | Resp 18

## 2017-06-13 DIAGNOSIS — D500 Iron deficiency anemia secondary to blood loss (chronic): Secondary | ICD-10-CM | POA: Diagnosis not present

## 2017-06-13 MED ORDER — FERUMOXYTOL INJECTION 510 MG/17 ML
510.0000 mg | Freq: Once | INTRAVENOUS | Status: AC
  Administered 2017-06-13: 510 mg via INTRAVENOUS
  Filled 2017-06-13: qty 17

## 2017-06-13 MED ORDER — SODIUM CHLORIDE 0.9 % IV SOLN
Freq: Once | INTRAVENOUS | Status: AC
  Administered 2017-06-13: 09:00:00 via INTRAVENOUS

## 2017-06-13 NOTE — Patient Instructions (Signed)

## 2017-06-20 ENCOUNTER — Ambulatory Visit (HOSPITAL_COMMUNITY)
Admission: RE | Admit: 2017-06-20 | Discharge: 2017-06-20 | Disposition: A | Discharge status: Home / Self Care | Payer: BLUE CROSS/BLUE SHIELD | Source: Ambulatory Visit | Attending: Family Medicine | Admitting: Family Medicine

## 2017-06-20 DIAGNOSIS — D509 Iron deficiency anemia, unspecified: Secondary | ICD-10-CM | POA: Insufficient documentation

## 2017-06-20 MED ORDER — SODIUM CHLORIDE 0.9 % IV SOLN
510.0000 mg | Freq: Once | INTRAVENOUS | Status: AC
  Administered 2017-06-20: 510 mg via INTRAVENOUS
  Filled 2017-06-20: qty 17

## 2017-06-20 NOTE — Progress Notes (Signed)
PATIENT CARE CENTER NOTE  Diagnosis: Iron Deficiency Anemia   Provider: Dr. Serena CroissantVinay Gudena   Procedure: IV Feraheme infusion   Note: Patient received infusion of Feraheme. Patient tolerated infusion well with no adverse reaction. Vitals remained stable. Discharge instructions given to patient. Patient alert, oriented and ambulatory at discharge.

## 2017-06-20 NOTE — Discharge Instructions (Signed)

## 2017-06-21 ENCOUNTER — Other Ambulatory Visit: Payer: Self-pay

## 2017-06-21 ENCOUNTER — Ambulatory Visit: Payer: BLUE CROSS/BLUE SHIELD | Admitting: Emergency Medicine

## 2017-06-21 ENCOUNTER — Encounter: Payer: Self-pay | Admitting: Emergency Medicine

## 2017-06-21 ENCOUNTER — Ambulatory Visit (INDEPENDENT_AMBULATORY_CARE_PROVIDER_SITE_OTHER): Payer: BLUE CROSS/BLUE SHIELD

## 2017-06-21 ENCOUNTER — Ambulatory Visit: Payer: Self-pay

## 2017-06-21 VITALS — BP 124/80 | HR 92 | Temp 98.1°F | Resp 16 | Ht 66.0 in | Wt 232.2 lb

## 2017-06-21 DIAGNOSIS — Z111 Encounter for screening for respiratory tuberculosis: Secondary | ICD-10-CM

## 2017-06-21 DIAGNOSIS — M542 Cervicalgia: Secondary | ICD-10-CM

## 2017-06-21 DIAGNOSIS — R0781 Pleurodynia: Secondary | ICD-10-CM

## 2017-06-21 DIAGNOSIS — R079 Chest pain, unspecified: Secondary | ICD-10-CM | POA: Diagnosis not present

## 2017-06-21 MED ORDER — PREDNISONE 20 MG PO TABS: 40.0000 mg | ORAL_TABLET | Freq: Every day | ORAL | 0 refills | Status: AC

## 2017-06-21 NOTE — Telephone Encounter (Signed)
Pt. Reports she had an iron infusion yesterday and the pain in her throat behind her esophagus, woke her up. No upper respiratory symptoms. Concerned it is related. Reason for Disposition . [1] Sore throat is the only symptom AND [2] present > 48 hours  Answer Assessment - Initial Assessment Questions 1. ONSET: "When did the throat start hurting?" (Hours or days ago)      During the night 2. SEVERITY: "How bad is the sore throat?" (Scale 1-10; mild, moderate or severe)   - MILD (1-3):  doesn't interfere with eating or normal activities   - MODERATE (4-7): interferes with eating some solids and normal activities   - SEVERE (8-10):  excruciating pain, interferes with most normal activities   - SEVERE DYSPHAGIA: can't swallow liquids, drooling     Moderate 3. STREP EXPOSURE: "Has there been any exposure to strep within the past week?" If so, ask: "What type of contact occurred?"      No 4.  VIRAL SYMPTOMS: "Are there any symptoms of a cold, such as a runny nose, cough, hoarse voice or red eyes?"      No 5. FEVER: "Do you have a fever?" If so, ask: "What is your temperature, how was it measured, and when did it start?"     No 6. PUS ON THE TONSILS: "Is there pus on the tonsils in the back of your throat?"     No 7. OTHER SYMPTOMS: "Do you have any other symptoms?" (e.g., difficulty breathing, headache, rash)     No 8. PREGNANCY: "Is there any chance you are pregnant?" "When was your last menstrual period?"     No  Protocols used: SORE THROAT-A-AH

## 2017-06-21 NOTE — Patient Instructions (Addendum)
  Take Tylenol for pain as needed.  Also take Zantac daily for 3 days along with Mylanta every 3-4 hours as needed.   IF you received an x-ray today, you will receive an invoice [facility]. Please contact [facility] at [phone] with questions or concerns regarding your invoice.   IF you received labwork today, you will receive an invoice from [facility]. Please contact [facility] at [phone] with questions or concerns regarding your invoice.   Our billing staff will not be able to assist you with questions regarding bills from these companies.  You will be contacted with the lab results as soon as they are available. The fastest way to get your results is to activate your My Chart account. Instructions are located on the last page of this paperwork. If you have not heard from us regarding the results in 2 weeks, please contact this office.     Pleurisy Pleurisy is irritation and swelling (inflammation) of the linings of your lungs (pleura). This can cause pain in your chest, back, or shoulder. It can also cause trouble breathing. Follow these instructions at home: Medicines  Take over-the-counter and prescription medicines only as told by your doctor.  If you were prescribed antibiotic medicine, take it as told by your doctor. Do not stop taking the antibiotic even if you start to feel better. Activity  Rest and return to your normal activities as told by your doctor. Ask your doctor what activities are safe for you.  Do not drive or use heavy machinery while taking prescription pain medicine. General instructions  Watch for any changes in your condition.  Take deep breaths often, even if it is painful. This can help prevent lung problems.  When lying down, lie on your painful side. This may help you feel less pain.  Do not smoke. If you need help quitting, ask your doctor.  Keep all follow-up visits as told by your doctor. This is important. Contact  a doctor if:  You have pain that: ? Gets worse. ? Does not get better with medicine. ? Lasts for more than 1 week.  You have a fever or chills.  You have a cough that does not get better at home.  You have trouble breathing that does not get better at home.  You cough up liquid that looks like pus (purulent secretions). Get help right away if:  Your lips, fingernails, or toenails turn dark or turn blue.  You cough up blood.  You have trouble breathing that gets worse.  You are making loud noises when you breathe (wheezing) and this gets worse.  You have pain that spreads to your neck, arms, or jaw.  You get a rash.  You throw up (vomit).  You pass out (faint). Summary  Pleurisy is irritation and swelling (inflammation) of the linings of your lungs (pleura).  Pleurisy can cause pain and trouble breathing.  If you have a cough that does not get better at home, contact your doctor.  Get help right away if you are having trouble breathing and it is getting worse. This information is not intended to replace advice given to you by your health care provider. Make sure you discuss any questions you have with your health care provider. Document Released: 03/11/2008 Document Revised: 12/22/2015 Document Reviewed: 12/22/2015 Elsevier Interactive Patient Education  2017 [employer]

## 2017-06-21 NOTE — Addendum Note (Signed)
Addended by: Georg RuddleJOYCE, CYNTHIA A on: 06/21/2017 05:55 PM   Modules accepted: Orders

## 2017-06-21 NOTE — Progress Notes (Signed)
Sylvia Cantrell 42 y.o.   Chief Complaint  Patient presents with  . Neck Pain    per patient radiating to chest and throat pain 06/20/2017 pm    HISTORY OF PRESENT ILLNESS: This is a 42 y.o. female complaining of achy pain to her lower neck that started late last night.  Woke her up.  Took some Tylenol followed by ibuprofen with mild relief.  Was able to sleep but today discomfort is still there.  Pain worse when she takes a deep breath.  Has a history of occasional epigastric discomfort with heartburn.  Suspicious for GERD.  Denies diabetes.  Denies history of a cardiac condition.  Patient has a history of heavy menstrual cycles with excessive blood loss requiring periodic iron infusions.  Received one yesterday morning.  Uneventful except for mild swelling to the site of IV insertion on her left hand.  Denies any other significant symptoms.  HPI   Prior to Admission medications   Medication Sig Start Date End Date Taking? Authorizing Provider  albuterol (PROVENTIL HFA;VENTOLIN HFA) 108 (90 Base) MCG/ACT inhaler Inhale 2 puffs into the lungs every 6 (six) hours as needed for wheezing or shortness of breath. 05/06/15  Yes Emi Belfast, FNP  EPINEPHrine (EPIPEN) 0.3 mg/0.3 mL SOAJ injection Inject 0.3 mLs (0.3 mg total) into the muscle once. 03/12/13  Yes Peyton Najjar, MD  Ferrous Sulfate 142 (45 Fe) MG TBCR Take 1 tablet by mouth daily. 08/11/16  Yes Stallings, Zoe A, MD  Fluticasone Furoate-Vilanterol (BREO ELLIPTA) 100-25 MCG/INH AEPB Inhale into the lungs.   Yes [provider]  ibuprofen (ADVIL,MOTRIN) 200 MG tablet Take 200 mg by mouth every 6 (six) hours as needed.   Yes [provider]  LORazepam (ATIVAN) 0.5 MG tablet Take 1/2-1 tab PO BID prn 11/17/16  Yes Stallings, Zoe A, MD  ranitidine (ZANTAC) 150 MG tablet Take 150 mg by mouth as needed for heartburn.   Yes [provider]  levofloxacin (LEVAQUIN) 500 MG tablet Take 1.5 tablets (750 mg total) by  mouth daily. Patient not taking: Reported on 02/23/2017 08/10/16   Ofilia Neas, PA-C  potassium chloride SA (K-DUR,KLOR-CON) 20 MEQ tablet Take 1 tablet (20 mEq total) by mouth daily. Patient not taking: Reported on 02/23/2017 08/11/16   Doristine Bosworth, MD    Allergies  Allergen Reactions  . Amoxicillin     Hives with first dose  . Codeine     REACTION: itching    Patient Active Problem List   Diagnosis Date Noted  . Iron deficiency anemia due to chronic blood loss 02/28/2017  . Situational anxiety 11/17/2016  . Menorrhagia with regular cycle 08/11/2016  . Anemia due to chronic blood loss 08/11/2016  . NEPHROLITHIASIS, HX OF 02/15/2010  . ALLERGIC RHINITIS 02/11/2010  . Asthma 02/11/2010    Past Medical History:  Diagnosis Date  . Allergy   . Anemia   . Anemia   . Anxiety   . Asthma   . Chronic headache   . Depression   . Hypertension   . IBS (irritable bowel syndrome)   . Kidney stones   . Nephrolithiasis   . Obesity   . Seizures (HCC)     Past Surgical History:  Procedure Laterality Date  . BREAST SURGERY    . TONSILLECTOMY  1987    Social History   Socioeconomic History  . Marital status: Married    Spouse name: Not on file  . Number of children: Not on  file  . Years of education: Not on file  . Highest education level: Not on file  Social Needs  . Financial resource strain: Not on file  . Food insecurity - worry: Not on file  . Food insecurity - inability: Not on file  . Transportation needs - medical: Not on file  . Transportation needs - non-medical: Not on file  Occupational History  . Not on file  Tobacco Use  . Smoking status: Never Smoker  . Smokeless tobacco: Never Used  Substance and Sexual Activity  . Alcohol use: No    Alcohol/week: 0.0 oz  . Drug use: No  . Sexual activity: No  Other Topics Concern  . Not on file  Social History Narrative  . Not on file    Family History  Problem Relation Age of Onset  . Hypertension  Mother   . Hyperthyroidism Mother   . Heart failure Father   . Heart failure Maternal Grandmother   . Hypertension Maternal Grandmother   . Kidney disease Maternal Grandmother   . Heart failure Paternal Grandfather      Review of Systems  Constitutional: Negative.  Negative for chills, diaphoresis and fever.  HENT: Negative.  Negative for congestion, nosebleeds, sinus pain and sore throat.   Eyes: Negative.  Negative for blurred vision, double vision, discharge and redness.  Respiratory: Negative.  Negative for cough, hemoptysis, shortness of breath and wheezing.   Cardiovascular: Negative.  Negative for chest pain, palpitations, claudication and leg swelling.  Gastrointestinal: Negative.  Negative for abdominal pain, diarrhea, nausea and vomiting.  Genitourinary: Negative.  Negative for dysuria and hematuria.  Musculoskeletal: Positive for neck pain.  Skin: Negative.  Negative for rash.  Neurological: Negative.  Negative for dizziness, sensory change, focal weakness, loss of consciousness, weakness and headaches.  Endo/Heme/Allergies: Negative.   All other systems reviewed and are negative.   Vitals:   06/21/17 1544  BP: 124/80  Pulse: 92  Resp: 16  Temp: 98.1 F (36.7 C)  SpO2: 98%    Physical Exam  Constitutional: She is oriented to person, place, and time. She appears well-developed and well-nourished.  HENT:  Head: Normocephalic and atraumatic.  Right Ear: External ear normal.  Left Ear: External ear normal.  Nose: Nose normal.  Mouth/Throat: Oropharynx is clear and moist.  Eyes: Conjunctivae and EOM are normal. Pupils are equal, round, and reactive to light.  Neck: Normal range of motion. Neck supple. No JVD present. No tracheal deviation present. No thyromegaly present.  No crepitation.  No thyroid tenderness.  Cardiovascular: Normal rate, regular rhythm and normal heart sounds. Exam reveals no gallop.  No murmur heard. Pulmonary/Chest: Effort normal and breath  sounds normal. No stridor. No respiratory distress. She has no wheezes.  Abdominal: Soft. Bowel sounds are normal. She exhibits no distension. There is no tenderness.  Musculoskeletal: Normal range of motion. She exhibits no edema or tenderness.  Lymphadenopathy:    She has no cervical adenopathy.  Neurological: She is alert and oriented to person, place, and time. No sensory deficit. She exhibits normal muscle tone. Coordination normal.  Skin: Skin is warm and dry. Capillary refill takes less than 2 seconds. No rash noted.  Psychiatric: She has a normal mood and affect. Her behavior is normal.  Vitals reviewed.  EKG: Normal sinus rhythm with ventricular rate 81 bpm.  No acute ischemic changes.  Normal intervals.  Compared with EKG done on 11/20/2012, no changes. Chest x-ray reviewed with patient.  NAD.  Radiologist report reviewed  as well. Dg Chest 2 View  Result Date: 06/21/2017 CLINICAL DATA:  Chest pain. EXAM: CHEST - 2 VIEW COMPARISON:  Radiographs of Aug 10, 2016. FINDINGS: The heart size and mediastinal contours are within normal limits. Both lungs are clear. No pneumothorax or pleural effusion is noted. The visualized skeletal structures are unremarkable. IMPRESSION: No active cardiopulmonary disease. Electronically Signed   By: Lupita Raider, M.D.   On: 06/21/2017 16:19     ASSESSMENT & PLAN: Beverlie was seen today for neck pain.  Diagnoses and all orders for this visit:  Neck pain -     EKG 12-Lead -     DG Chest 2 View; Future  Anterior pleuritic pain -     predniSONE (DELTASONE) 20 MG tablet; Take 2 tablets (40 mg total) by mouth daily with breakfast for 5 days.    Patient Instructions    Take Tylenol for pain as needed.  Also take Zantac daily for 3 days along with Mylanta every 3-4 hours as needed.   IF you received an x-ray today, you will receive an invoice from [facility]. Please contact [facility] at [phone] with questions or concerns  regarding your invoice.   IF you received labwork today, you will receive an invoice from [place] [facility]. Please contact [facility] at [phone] with questions or concerns regarding your invoice.   Our billing staff will not be able to assist you with questions regarding bills from these companies.  You will be contacted with the lab results as soon as they are available. The fastest way to get your results is to activate your My Chart account. Instructions are located on the last page of this paperwork. If you have not heard from [place] regarding the results in 2 weeks, please contact this office.     Pleurisy Pleurisy is irritation and swelling (inflammation) of the linings of your lungs (pleura). This can cause pain in your chest, back, or shoulder. It can also cause trouble breathing. Follow these instructions at home: Medicines  Take over-the-counter and prescription medicines only as told by your doctor.  If you were prescribed antibiotic medicine, take it as told by your doctor. Do not stop taking the antibiotic even if you start to feel better. Activity  Rest and return to your normal activities as told by your doctor. Ask your doctor what activities are safe for you.  Do not drive or use heavy machinery while taking prescription pain medicine. General instructions  Watch for any changes in your condition.  Take deep breaths often, even if it is painful. This can help prevent lung problems.  When lying down, lie on your painful side. This may help you feel less pain.  Do not smoke. If you need help quitting, ask your doctor.  Keep all follow-up visits as told by your doctor. This is important. Contact a doctor if:  You have pain that: ? Gets worse. ? Does not get better with medicine. ? Lasts for more than 1 week.  You have a fever or chills.  You have a cough that does not get better at home.  You have trouble breathing that does not get better at home.  You cough up  liquid that looks like pus (purulent secretions). Get help right away if:  Your lips, fingernails, or toenails turn dark or turn blue.  You cough up blood.  You have trouble breathing that gets worse.  You are making loud noises when you breathe (wheezing) and this gets worse.  You have pain that spreads to your neck, arms, or jaw.  You get a rash.  You throw up (vomit).  You pass out (faint). Summary  Pleurisy is irritation and swelling (inflammation) of the linings of your lungs (pleura).  Pleurisy can cause pain and trouble breathing.  If you have a cough that does not get better at home, contact your doctor.  Get help right away if you are having trouble breathing and it is getting worse. This information is not intended to replace advice given to you by your health care provider. Make sure you discuss any questions you have with your health care provider. Document Released: 03/11/2008 Document Revised: 12/22/2015 Document Reviewed: 12/22/2015 Elsevier Interactive Patient Education  2017 [employer] Inc.      Edwina BarthMiguel Avari Gelles, MD [facility]

## 2017-06-21 NOTE — Telephone Encounter (Signed)
Denies any fever. States it "really hurts to swallow." Appointment given for today.

## 2017-09-12 ENCOUNTER — Inpatient Hospital Stay: Payer: BLUE CROSS/BLUE SHIELD | Attending: Hematology and Oncology

## 2017-09-12 DIAGNOSIS — Z885 Allergy status to narcotic agent status: Secondary | ICD-10-CM | POA: Diagnosis not present

## 2017-09-12 DIAGNOSIS — Z880 Allergy status to penicillin: Secondary | ICD-10-CM | POA: Insufficient documentation

## 2017-09-12 DIAGNOSIS — D500 Iron deficiency anemia secondary to blood loss (chronic): Secondary | ICD-10-CM | POA: Diagnosis not present

## 2017-09-12 DIAGNOSIS — N920 Excessive and frequent menstruation with regular cycle: Secondary | ICD-10-CM | POA: Diagnosis not present

## 2017-09-12 DIAGNOSIS — Z79899 Other long term (current) drug therapy: Secondary | ICD-10-CM | POA: Insufficient documentation

## 2017-09-12 LAB — CBC WITH DIFFERENTIAL (CANCER CENTER ONLY)
Basophils Absolute: 0.1 10*3/uL (ref 0.0–0.1)
Basophils Relative: 1 %
EOS PCT: 5 %
Eosinophils Absolute: 0.3 10*3/uL (ref 0.0–0.5)
HEMATOCRIT: 40.2 % (ref 34.8–46.6)
Hemoglobin: 13.4 g/dL (ref 11.6–15.9)
LYMPHS ABS: 1.8 10*3/uL (ref 0.9–3.3)
LYMPHS PCT: 29 %
MCH: 29.2 pg (ref 25.1–34.0)
MCHC: 33.4 g/dL (ref 31.5–36.0)
MCV: 87.5 fL (ref 79.5–101.0)
Monocytes Absolute: 0.3 10*3/uL (ref 0.1–0.9)
Monocytes Relative: 6 %
NEUTROS ABS: 3.7 10*3/uL (ref 1.5–6.5)
Neutrophils Relative %: 59 %
PLATELETS: 216 10*3/uL (ref 145–400)
RBC: 4.6 MIL/uL (ref 3.70–5.45)
RDW: 15.2 % — ABNORMAL HIGH (ref 11.2–14.5)
WBC: 6.1 10*3/uL (ref 3.9–10.3)

## 2017-09-12 LAB — FERRITIN: FERRITIN: 19 ng/mL (ref 9–269)

## 2017-09-12 LAB — IRON AND TIBC
Iron: 56 ug/dL (ref 41–142)
Saturation Ratios: 19 % — ABNORMAL LOW (ref 21–57)
TIBC: 295 ug/dL (ref 236–444)
UIBC: 240 ug/dL

## 2017-09-13 ENCOUNTER — Telehealth: Payer: Self-pay | Admitting: Hematology and Oncology

## 2017-09-13 ENCOUNTER — Inpatient Hospital Stay (HOSPITAL_BASED_OUTPATIENT_CLINIC_OR_DEPARTMENT_OTHER): Payer: BLUE CROSS/BLUE SHIELD | Admitting: Hematology and Oncology

## 2017-09-13 DIAGNOSIS — D500 Iron deficiency anemia secondary to blood loss (chronic): Secondary | ICD-10-CM | POA: Diagnosis not present

## 2017-09-13 DIAGNOSIS — Z79899 Other long term (current) drug therapy: Secondary | ICD-10-CM | POA: Diagnosis not present

## 2017-09-13 DIAGNOSIS — N920 Excessive and frequent menstruation with regular cycle: Secondary | ICD-10-CM | POA: Diagnosis not present

## 2017-09-13 DIAGNOSIS — Z885 Allergy status to narcotic agent status: Secondary | ICD-10-CM | POA: Diagnosis not present

## 2017-09-13 DIAGNOSIS — Z880 Allergy status to penicillin: Secondary | ICD-10-CM | POA: Diagnosis not present

## 2017-09-13 NOTE — Assessment & Plan Note (Signed)
Chronic uterine bleeding heavy periods causing severe anemia, intolerant to oral iron therapy because of abdominal pain and gastritis.  IV iron: November 2018, March 2019  Lab review  09/12/2017: Hemoglobin 13.4, MCV 87.5, iron saturation 19%, ferritin 19  I discussed with the patient that she does not need IV iron therapy at this time. It is likely that she may need IV iron treatment after the next 3 months.  Return to clinic in 3 months with labs done 1 day before her visit

## 2017-09-13 NOTE — Telephone Encounter (Signed)
Patient declined avs and calendar  °

## 2017-09-13 NOTE — Progress Notes (Signed)
Patient Care Team: Doristine BosworthStallings, Zoe A, MD as PCP - General (Internal Medicine)  DIAGNOSIS:  Encounter Diagnosis  Name Primary?  . Iron deficiency anemia due to chronic blood loss     CHIEF COMPLIANT: Follow-up on iron deficiency anemia  INTERVAL HISTORY: Sylvia Cantrell is a 42 year old with above-mentioned history of chronic uterine bleeding causing recurrent iron deficiency anemia who is here to recheck her blood work to discuss if she needs additional IV iron.  She received IV iron in March 2019 and appears to have tolerated it extremely well.  She does not have any new symptoms. Over the past 4 weeks she had to menstrual cycles and she is on the second cycle already.  Her gynecologist is planning an ablation procedure to stop the excessive bleeding.  REVIEW OF SYSTEMS:   Constitutional: Denies fevers, chills or abnormal weight loss Eyes: Denies blurriness of vision Ears, nose, mouth, throat, and face: Denies mucositis or sore throat Respiratory: Denies cough, dyspnea or wheezes Cardiovascular: Denies palpitation, chest discomfort Gastrointestinal:  Denies nausea, heartburn or change in bowel habits Skin: Denies abnormal skin rashes Lymphatics: Denies new lymphadenopathy or easy bruising Neurological:Denies numbness, tingling or new weaknesses Behavioral/Psych: Mood is stable, no new changes  Extremities: No lower extremity edema  All other systems were reviewed with the patient and are negative.  I have reviewed the past medical history, past surgical history, social history and family history with the patient and they are unchanged from previous note.  ALLERGIES:  is allergic to amoxicillin and codeine.  MEDICATIONS:  Current Outpatient Medications  Medication Sig Dispense Refill  . albuterol (PROVENTIL HFA;VENTOLIN HFA) 108 (90 Base) MCG/ACT inhaler Inhale 2 puffs into the lungs every 6 (six) hours as needed for wheezing or shortness of breath. 1 Inhaler 2  . EPINEPHrine  (EPIPEN) 0.3 mg/0.3 mL SOAJ injection Inject 0.3 mLs (0.3 mg total) into the muscle once. 2 Device 2  . Ferrous Sulfate 142 (45 Fe) MG TBCR Take 1 tablet by mouth daily. 30 tablet 0  . Fluticasone Furoate-Vilanterol (BREO ELLIPTA) 100-25 MCG/INH AEPB Inhale into the lungs.    Marland Kitchen. ibuprofen (ADVIL,MOTRIN) 200 MG tablet Take 200 mg by mouth every 6 (six) hours as needed.    Marland Kitchen. levofloxacin (LEVAQUIN) 500 MG tablet Take 1.5 tablets (750 mg total) by mouth daily. (Patient not taking: Reported on 02/23/2017) 5 tablet 0  . LORazepam (ATIVAN) 0.5 MG tablet Take 1/2-1 tab PO BID prn 30 tablet 1  . potassium chloride SA (K-DUR,KLOR-CON) 20 MEQ tablet Take 1 tablet (20 mEq total) by mouth daily. (Patient not taking: Reported on 02/23/2017) 30 tablet 0  . ranitidine (ZANTAC) 150 MG tablet Take 150 mg by mouth as needed for heartburn.     Current Facility-Administered Medications  Medication Dose Route Frequency Provider Last Rate Last Dose  . sodium chloride 0.9 % bolus 1,000 mL  1,000 mL Intravenous Once Creta LevinStallings, Zoe A, MD        PHYSICAL EXAMINATION: ECOG PERFORMANCE STATUS: 1 - Symptomatic but completely ambulatory  Vitals:   09/13/17 0842  BP: 123/75  Pulse: 77  Resp: 19  Temp: 98 F (36.7 C)  SpO2: 100%   Filed Weights   09/13/17 0842  Weight: 234 lb 3.2 oz (106.2 kg)    GENERAL:alert, no distress and comfortable SKIN: skin color, texture, turgor are normal, no rashes or significant lesions EYES: normal, Conjunctiva are pink and non-injected, sclera clear OROPHARYNX:no exudate, no erythema and lips, buccal mucosa, and tongue normal  NECK: supple, thyroid normal size, non-tender, without nodularity LYMPH:  no palpable lymphadenopathy in the cervical, axillary or inguinal LUNGS: clear to auscultation and percussion with normal breathing effort HEART: regular rate & rhythm and no murmurs and no lower extremity edema ABDOMEN:abdomen soft, non-tender and normal bowel  sounds MUSCULOSKELETAL:no cyanosis of digits and no clubbing  NEURO: alert & oriented x 3 with fluent speech, no focal motor/sensory deficits EXTREMITIES: No lower extremity edema  LABORATORY DATA:  I have reviewed the data as listed CMP Latest Ref Rng & Units 08/10/2016 05/06/2015 12/21/2013  Glucose 65 - 99 mg/dL 161(W) 74 960(A)  BUN 6 - 24 mg/dL 10 10 9   Creatinine 0.57 - 1.00 mg/dL 5.40 9.81 1.91  Sodium 134 - 144 mmol/L 140 141 138  Potassium 3.5 - 5.2 mmol/L 3.4(L) 3.9 4.1  Chloride 96 - 106 mmol/L 102 107 105  CO2 18 - 29 mmol/L 22 26 27   Calcium 8.7 - 10.2 mg/dL 8.9 9.0 9.0  Total Protein 6.1 - 8.1 g/dL - 6.8 6.6  Total Bilirubin 0.2 - 1.2 mg/dL - 0.3 0.3  Alkaline Phos 33 - 115 U/L - 89 85  AST 10 - 30 U/L - 13 14  ALT 6 - 29 U/L - 14 19    Lab Results  Component Value Date   WBC 6.1 09/12/2017   HGB 13.4 09/12/2017   HCT 40.2 09/12/2017   MCV 87.5 09/12/2017   PLT 216 09/12/2017   NEUTROABS 3.7 09/12/2017    ASSESSMENT & PLAN:  Iron deficiency anemia due to chronic blood loss Chronic uterine bleeding heavy periods causing severe anemia, intolerant to oral iron therapy because of abdominal pain and gastritis.  IV iron: November 2018, March 2019  Lab review   09/12/2017: Hemoglobin 13.4, MCV 87.5, iron saturation 19%, ferritin 19  I discussed with the patient that she does not need IV iron therapy at this time. Patient is likely to get an ablation for the heavy menstrual cycles in the next week.  If it is successful then she may not need IV iron in the future.   Return to clinic in 3 months with labs done 1 day before her visit   Orders Placed This Encounter  Procedures  . Iron and TIBC    Standing Status:   Future    Standing Expiration Date:   09/13/2018  . Ferritin    Standing Status:   Future    Standing Expiration Date:   09/13/2018  . CBC with Differential (Cancer Center Only)    Standing Status:   Future    Standing Expiration Date:   09/14/2018   The  patient has a good understanding of the overall plan. she agrees with it. she will call with any problems that may develop before the next visit here.   Tamsen Meek, MD 09/13/17

## 2017-10-27 ENCOUNTER — Other Ambulatory Visit: Payer: Self-pay

## 2017-10-27 ENCOUNTER — Encounter: Payer: Self-pay | Admitting: Family Medicine

## 2017-10-27 ENCOUNTER — Ambulatory Visit (INDEPENDENT_AMBULATORY_CARE_PROVIDER_SITE_OTHER): Payer: BLUE CROSS/BLUE SHIELD | Admitting: Family Medicine

## 2017-10-27 VITALS — BP 126/82 | HR 93 | Temp 99.1°F | Resp 17 | Ht 66.0 in | Wt 232.4 lb

## 2017-10-27 DIAGNOSIS — J069 Acute upper respiratory infection, unspecified: Secondary | ICD-10-CM | POA: Diagnosis not present

## 2017-10-27 DIAGNOSIS — J301 Allergic rhinitis due to pollen: Secondary | ICD-10-CM | POA: Diagnosis not present

## 2017-10-27 MED ORDER — FLUTICASONE PROPIONATE 50 MCG/ACT NA SUSP: 2.0000 | Freq: Every day | NASAL | 6 refills | Status: AC

## 2017-10-27 MED ORDER — HYDROCODONE-HOMATROPINE 5-1.5 MG/5ML PO SYRP: 5.0000 mL | ORAL_SOLUTION | Freq: Three times a day (TID) | ORAL | 0 refills | Status: DC | PRN

## 2017-10-27 NOTE — Progress Notes (Signed)
Chief Complaint  Patient presents with  . upper respiratory infection possibly    onset: tuesday evening, started with scratchy irritated throat, then coughing that evening  and Wed morning no voice,  taking dayquil for cough and it helps for about an hour, cough is getting progressively  worse, fever twice with highest temp of 100, tenderness in neck  and sinuses, using allergym eds and inhaler.  Chest congestion and coughing up yellow thick drainage.      HPI  She reports that she has been having sinus congestion, running nose, cough, ear pressure She states that her ear is scratchy and her throat is irritated She is kept up all night by a cough that is nonproductive She states that she had a temp of 100 but just feels tired from not sleeping due to cough She is taking albuterol and allergy meds dayquil is not helping her cough either   Past Medical History:  Diagnosis Date  . Allergy   . Anemia   . Anemia   . Anxiety   . Asthma   . Chronic headache   . Depression   . Hypertension   . IBS (irritable bowel syndrome)   . Kidney stones   . Nephrolithiasis   . Obesity   . Seizures (HCC)     Current Outpatient Medications  Medication Sig Dispense Refill  . albuterol (PROVENTIL HFA;VENTOLIN HFA) 108 (90 Base) MCG/ACT inhaler Inhale 2 puffs into the lungs every 6 (six) hours as needed for wheezing or shortness of breath. 1 Inhaler 2  . EPINEPHrine (EPIPEN) 0.3 mg/0.3 mL SOAJ injection Inject 0.3 mLs (0.3 mg total) into the muscle once. 2 Device 2  . Ferrous Sulfate 142 (45 Fe) MG TBCR Take 1 tablet by mouth daily. 30 tablet 0  . Fluticasone Furoate-Vilanterol (BREO ELLIPTA) 100-25 MCG/INH AEPB Inhale into the lungs.    Marland Kitchen. ibuprofen (ADVIL,MOTRIN) 200 MG tablet Take 200 mg by mouth every 6 (six) hours as needed.    Marland Kitchen. LORazepam (ATIVAN) 0.5 MG tablet Take 1/2-1 tab PO BID prn 30 tablet 1  . potassium chloride SA (K-DUR,KLOR-CON) 20 MEQ tablet Take 1 tablet (20 mEq total) by mouth  daily. 30 tablet 0  . ranitidine (ZANTAC) 150 MG tablet Take 150 mg by mouth as needed for heartburn.    . fluticasone (FLONASE) 50 MCG/ACT nasal spray Place 2 sprays into both nostrils daily. 16 g 6  . HYDROcodone-homatropine (HYCODAN) 5-1.5 MG/5ML syrup Take 5 mLs by mouth every 8 (eight) hours as needed for cough. 120 mL 0   Current Facility-Administered Medications  Medication Dose Route Frequency Provider Last Rate Last Dose  . sodium chloride 0.9 % bolus 1,000 mL  1,000 mL Intravenous Once Doristine BosworthStallings, Cypress Hinkson A, MD        Allergies:  Allergies  Allergen Reactions  . Amoxicillin     Hives with first dose  . Codeine     REACTION: itching    Past Surgical History:  Procedure Laterality Date  . BREAST SURGERY    . TONSILLECTOMY  1987    Social History   Socioeconomic History  . Marital status: Married    Spouse name: Not on file  . Number of children: Not on file  . Years of education: Not on file  . Highest education level: Not on file  Occupational History  . Not on file  Social Needs  . Financial resource strain: Not on file  . Food insecurity:    Worry: Not on file  Inability: Not on file  . Transportation needs:    Medical: Not on file    Non-medical: Not on file  Tobacco Use  . Smoking status: Never Smoker  . Smokeless tobacco: Never Used  Substance and Sexual Activity  . Alcohol use: No    Alcohol/week: 0.0 oz  . Drug use: No  . Sexual activity: Never  Lifestyle  . Physical activity:    Days per week: Not on file    Minutes per session: Not on file  . Stress: Not on file  Relationships  . Social connections:    Talks on phone: Not on file    Gets together: Not on file    Attends religious service: Not on file    Active member of club or organization: Not on file    Attends meetings of clubs or organizations: Not on file    Relationship status: Not on file  Other Topics Concern  . Not on file  Social History Narrative  . Not on file    Family  History  Problem Relation Age of Onset  . Hypertension Mother   . Hyperthyroidism Mother   . Heart failure Father   . Heart failure Maternal Grandmother   . Hypertension Maternal Grandmother   . Kidney disease Maternal Grandmother   . Heart failure Paternal Grandfather      ROS Review of Systems See HPI Constitution: No fevers or chills No malaise No diaphoresis Skin: No rash or itching Eyes: no blurry vision, no double vision GU: no dysuria or hematuria Neuro: no dizziness or headaches all others reviewed and negative   Objective: Vitals:   10/27/17 1214  BP: 126/82  Pulse: 93  Resp: 17  Temp: 99.1 F (37.3 C)  TempSrc: Oral  SpO2: 97%  Weight: 232 lb 6.4 oz (105.4 kg)  Height: 5\' 6"  (1.676 m)    Physical Exam General: alert, oriented, in NAD Head: normocephalic, atraumatic, no sinus tenderness Eyes: EOM intact, no scleral icterus or conjunctival injection Ears: TM clear bilaterally Nose: mucosa nonerythematous, nonedematous Throat: no pharyngeal exudate or erythema Lymph: no posterior auricular, submental or cervical lymph adenopathy Heart: normal rate, normal sinus rhythm, no murmurs Lungs: clear to auscultation bilaterally, no wheezing   Assessment and Plan Itha was seen today for upper respiratory infection possibly.  Diagnoses and all orders for this visit:  Viral URI Seasonal allergic rhinitis due to pollen  Other orders -     fluticasone (FLONASE) 50 MCG/ACT nasal spray; Place 2 sprays into both nostrils daily. -     HYDROcodone-homatropine (HYCODAN) 5-1.5 MG/5ML syrup; Take 5 mLs by mouth every 8 (eight) hours as needed for cough.  Gave hycodan for cough flonase for allergic rhinitis Continue dayquil  Rest and hydration   Hartman Minahan A Masie Bermingham

## 2017-10-27 NOTE — Patient Instructions (Addendum)
IF you received an x-ray today, you will receive an invoice from [facility]. Please contact [facility] at [phone] with questions or concerns regarding your invoice.   IF you received labwork today, you will receive an invoice from [facility]. Please contact [facility] at [phone] with questions or concerns regarding your invoice.   Our billing staff will not be able to assist you with questions regarding bills from these companies.  You will be contacted with the lab results as soon as they are available. The fastest way to get your results is to activate your My Chart account. Instructions are located on the last page of this paperwork. If you have not heard from us regarding the results in 2 weeks, please contact this office.    We recommend that you schedule a mammogram for breast cancer screening. Typically, you do not need a referral to do this. Please contact a local imaging center to schedule your mammogram.  [facility] - [phone]  *ask for the Radiology Department [facility] - [phone] or [phone]  [facility] - [phone] [facility] - [phone]) [phone] [facility] - [phone]  *ask for the Radiology Department [facility] - [phone]  *ask for the Radiology Department [facility] - [phone]  *ask for the Mammography Department [facility] - [phone] Rhinitis, Adult Allergic rhinitis is an allergic reaction that affects the mucous membrane inside the nose. It causes sneezing, a runny or stuffy nose, and the feeling of mucus going down the back of the throat (postnasal drip). Allergic rhinitis can be mild to severe. There are two types of allergic rhinitis:  Seasonal. This type is also called hay fever. It happens only during certain seasons.  Perennial. This type can happen at any time of the year.  What  are the causes? This condition happens when the body's defense system (immune system) responds to certain harmless substances called allergens as though they were germs.  Seasonal allergic rhinitis is triggered by pollen, which can come from grasses, trees, and weeds. Perennial allergic rhinitis may be caused by:  House dust mites.  Pet dander.  Mold spores.  What are the signs or symptoms? Symptoms of this condition include:  Sneezing.  Runny or stuffy nose (nasal congestion).  Postnasal drip.  Itchy nose.  Tearing of the eyes.  Trouble sleeping.  Daytime sleepiness.  How is this diagnosed? This condition may be diagnosed based on:  Your medical history.  A physical exam.  Tests to check for related conditions, such as: ? Asthma. ? Pink eye. ? Ear infection. ? Upper respiratory infection.  Tests to find out which allergens trigger your symptoms. These may include skin or blood tests.  How is this treated? There is no cure for this condition, but treatment can help control symptoms. Treatment may include:  Taking medicines that block allergy symptoms, such as antihistamines. Medicine may be given as a shot, nasal spray, or pill.  Avoiding the allergen.  Desensitization. This treatment involves getting ongoing shots until your body becomes less sensitive to the allergen. This treatment may be done if other treatments do not help.  If taking medicine and avoiding the allergen does not work, new, stronger medicines may be prescribed.  Follow these instructions at home:  Find out what you are allergic to. Common allergens include smoke, dust, and pollen.  Avoid the things you are allergic to. These are  some things you can do to help avoid allergens: ? Replace carpet with wood, tile, or vinyl flooring. Carpet can trap dander and dust. ? Do not smoke. Do not allow smoking in your home. ? Change your heating and air conditioning filter at least once a  month. ? During allergy season:  Keep windows closed as much as possible.  Plan outdoor activities when pollen counts are lowest. This is usually during the evening hours.  When coming indoors, change clothing and shower before sitting on furniture or bedding.  Take over-the-counter and prescription medicines only as told by your health care provider.  Keep all follow-up visits as told by your health care provider. This is important. Contact a health care provider if:  You have a fever.  You develop a persistent cough.  You make whistling sounds when you breathe (you wheeze).  Your symptoms interfere with your normal daily activities. Get help right away if:  You have shortness of breath. Summary  This condition can be managed by taking medicines as directed and avoiding allergens.  Contact your health care provider if you develop a persistent cough or fever.  During allergy season, keep windows closed as much as possible. This information is not intended to replace advice given to you by your health care provider. Make sure you discuss any questions you have with your health care provider. Document Released: 12/22/2000 Document Revised: 05/06/2016 Document Reviewed: 05/06/2016 Elsevier Interactive Patient Education  [employer].

## 2017-12-13 ENCOUNTER — Inpatient Hospital Stay: Payer: BLUE CROSS/BLUE SHIELD

## 2017-12-14 ENCOUNTER — Telehealth: Payer: Self-pay | Admitting: Hematology and Oncology

## 2017-12-14 ENCOUNTER — Inpatient Hospital Stay: Payer: BLUE CROSS/BLUE SHIELD | Admitting: Hematology and Oncology

## 2017-12-14 NOTE — Assessment & Plan Note (Deleted)
Chronic uterine bleeding heavy periods causing severe anemia, intolerant to oral iron therapy because of abdominal pain and gastritis.  IV iron: November 2018, March 2019  Lab review

## 2017-12-14 NOTE — Telephone Encounter (Signed)
Returned call patient re rescheduling 9/3 lab. Patient also missed f/u today at 8:15am. Gave patient new lab for 9/9 and new f/u for 9/10.

## 2017-12-19 ENCOUNTER — Inpatient Hospital Stay: Payer: BLUE CROSS/BLUE SHIELD | Attending: Hematology and Oncology

## 2017-12-19 DIAGNOSIS — D500 Iron deficiency anemia secondary to blood loss (chronic): Secondary | ICD-10-CM | POA: Diagnosis not present

## 2017-12-19 DIAGNOSIS — Z79899 Other long term (current) drug therapy: Secondary | ICD-10-CM | POA: Diagnosis not present

## 2017-12-19 DIAGNOSIS — N920 Excessive and frequent menstruation with regular cycle: Secondary | ICD-10-CM | POA: Diagnosis not present

## 2017-12-19 DIAGNOSIS — Z880 Allergy status to penicillin: Secondary | ICD-10-CM | POA: Insufficient documentation

## 2017-12-19 DIAGNOSIS — Z885 Allergy status to narcotic agent status: Secondary | ICD-10-CM | POA: Insufficient documentation

## 2017-12-19 LAB — IRON AND TIBC
IRON: 27 ug/dL — AB (ref 41–142)
Saturation Ratios: 7 % — ABNORMAL LOW (ref 21–57)
TIBC: 371 ug/dL (ref 236–444)
UIBC: 344 ug/dL

## 2017-12-19 LAB — CBC WITH DIFFERENTIAL (CANCER CENTER ONLY)
Basophils Absolute: 0 10*3/uL (ref 0.0–0.1)
Basophils Relative: 0 %
EOS ABS: 0.1 10*3/uL (ref 0.0–0.5)
EOS PCT: 1 %
HCT: 37.6 % (ref 34.8–46.6)
Hemoglobin: 12 g/dL (ref 11.6–15.9)
LYMPHS ABS: 2 10*3/uL (ref 0.9–3.3)
Lymphocytes Relative: 28 %
MCH: 26.6 pg (ref 25.1–34.0)
MCHC: 31.9 g/dL (ref 31.5–36.0)
MCV: 83.4 fL (ref 79.5–101.0)
MONO ABS: 0.5 10*3/uL (ref 0.1–0.9)
Monocytes Relative: 7 %
Neutro Abs: 4.5 10*3/uL (ref 1.5–6.5)
Neutrophils Relative %: 64 %
PLATELETS: 241 10*3/uL (ref 145–400)
RBC: 4.51 MIL/uL (ref 3.70–5.45)
RDW: 13.9 % (ref 11.2–14.5)
WBC Count: 7.1 10*3/uL (ref 3.9–10.3)

## 2017-12-19 LAB — FERRITIN

## 2017-12-20 ENCOUNTER — Telehealth: Payer: Self-pay | Admitting: Hematology and Oncology

## 2017-12-20 ENCOUNTER — Inpatient Hospital Stay: Payer: BLUE CROSS/BLUE SHIELD

## 2017-12-20 ENCOUNTER — Inpatient Hospital Stay (HOSPITAL_BASED_OUTPATIENT_CLINIC_OR_DEPARTMENT_OTHER): Payer: BLUE CROSS/BLUE SHIELD | Admitting: Hematology and Oncology

## 2017-12-20 VITALS — BP 119/80 | HR 76 | Temp 98.7°F | Resp 20

## 2017-12-20 DIAGNOSIS — Z79899 Other long term (current) drug therapy: Secondary | ICD-10-CM

## 2017-12-20 DIAGNOSIS — Z885 Allergy status to narcotic agent status: Secondary | ICD-10-CM

## 2017-12-20 DIAGNOSIS — N920 Excessive and frequent menstruation with regular cycle: Secondary | ICD-10-CM | POA: Diagnosis not present

## 2017-12-20 DIAGNOSIS — Z880 Allergy status to penicillin: Secondary | ICD-10-CM

## 2017-12-20 DIAGNOSIS — D500 Iron deficiency anemia secondary to blood loss (chronic): Secondary | ICD-10-CM

## 2017-12-20 MED ORDER — SODIUM CHLORIDE 0.9 % IV SOLN
Freq: Once | INTRAVENOUS | Status: AC
  Administered 2017-12-20: 20 mL/h via INTRAVENOUS
  Filled 2017-12-20: qty 250

## 2017-12-20 MED ORDER — SODIUM CHLORIDE 0.9 % IV SOLN
510.0000 mg | Freq: Once | INTRAVENOUS | Status: AC
  Administered 2017-12-20: 510 mg via INTRAVENOUS
  Filled 2017-12-20: qty 17

## 2017-12-20 NOTE — Telephone Encounter (Signed)
Gave avs and calendar ° °

## 2017-12-20 NOTE — Assessment & Plan Note (Signed)
Chronic uterine bleeding heavy periods causing severe anemia, intolerant to oral iron therapy because of abdominal pain and gastritis.  IV iron: November 2018, March 2019  Lab review: Hemoglobin 12, MCV 83.4, platelet count 241. Serum iron 27, TIBC 371, iron saturation 7%, ferritin less than 4 I discussed with the patient that she would likely need IV iron therapy if not she is going to get performed the anemic in a short period of time. We will arrange for IV iron to be given 2 doses 1 week apart.  Return to clinic in 3 months with labs done 1 day before visit.

## 2017-12-20 NOTE — Progress Notes (Signed)
Patient Care Team: Doristine Bosworth, MD as PCP - General (Internal Medicine)  DIAGNOSIS:  Encounter Diagnosis  Name Primary?  . Iron deficiency anemia due to chronic blood loss    CHIEF COMPLIANT: Follow-up of iron deficiency anemia  INTERVAL HISTORY: Sylvia Cantrell is a 42 year old with above-mentioned history of heavy menstrual bleeding related iron deficiency anemia who received IV iron therapy previously. She tells me that the bleeding has improved.  However today she is once again profoundly iron deficient with a ferritin less than 4.  She does not seem to have any effects of lack of iron at this time clinically.  She had previously been intolerant to oral iron therapy.  REVIEW OF SYSTEMS:   Constitutional: Denies fevers, chills or abnormal weight loss Eyes: Denies blurriness of vision Ears, nose, mouth, throat, and face: Denies mucositis or sore throat Respiratory: Denies cough, dyspnea or wheezes Cardiovascular: Denies palpitation, chest discomfort Gastrointestinal:  Denies nausea, heartburn or change in bowel habits Skin: Denies abnormal skin rashes Lymphatics: Denies new lymphadenopathy or easy bruising Neurological:Denies numbness, tingling or new weaknesses Behavioral/Psych: Mood is stable, no new changes  Extremities: No lower extremity edema   All other systems were reviewed with the patient and are negative.  I have reviewed the past medical history, past surgical history, social history and family history with the patient and they are unchanged from previous note.  ALLERGIES:  is allergic to amoxicillin and codeine.  MEDICATIONS:  Current Outpatient Medications  Medication Sig Dispense Refill  . albuterol (PROVENTIL HFA;VENTOLIN HFA) 108 (90 Base) MCG/ACT inhaler Inhale 2 puffs into the lungs every 6 (six) hours as needed for wheezing or shortness of breath. 1 Inhaler 2  . EPINEPHrine (EPIPEN) 0.3 mg/0.3 mL SOAJ injection Inject 0.3 mLs (0.3 mg total) into the  muscle once. 2 Device 2  . Ferrous Sulfate 142 (45 Fe) MG TBCR Take 1 tablet by mouth daily. 30 tablet 0  . fluticasone (FLONASE) 50 MCG/ACT nasal spray Place 2 sprays into both nostrils daily. 16 g 6  . Fluticasone Furoate-Vilanterol (BREO ELLIPTA) 100-25 MCG/INH AEPB Inhale into the lungs.    Marland Kitchen HYDROcodone-homatropine (HYCODAN) 5-1.5 MG/5ML syrup Take 5 mLs by mouth every 8 (eight) hours as needed for cough. 120 mL 0  . ibuprofen (ADVIL,MOTRIN) 200 MG tablet Take 200 mg by mouth every 6 (six) hours as needed.    Marland Kitchen LORazepam (ATIVAN) 0.5 MG tablet Take 1/2-1 tab PO BID prn 30 tablet 1  . potassium chloride SA (K-DUR,KLOR-CON) 20 MEQ tablet Take 1 tablet (20 mEq total) by mouth daily. 30 tablet 0  . ranitidine (ZANTAC) 150 MG tablet Take 150 mg by mouth as needed for heartburn.     Current Facility-Administered Medications  Medication Dose Route Frequency Provider Last Rate Last Dose  . sodium chloride 0.9 % bolus 1,000 mL  1,000 mL Intravenous Once Creta Levin, Zoe A, MD        PHYSICAL EXAMINATION: ECOG PERFORMANCE STATUS: 1 - Symptomatic but completely ambulatory  Vitals:   12/20/17 0952  BP: 128/86  Resp: 18  Temp: 98.6 F (37 C)  SpO2: 100%   Filed Weights   12/20/17 0952  Weight: 234 lb 6.4 oz (106.3 kg)    GENERAL:alert, no distress and comfortable SKIN: skin color, texture, turgor are normal, no rashes or significant lesions EYES: normal, Conjunctiva are pink and non-injected, sclera clear OROPHARYNX:no exudate, no erythema and lips, buccal mucosa, and tongue normal  NECK: supple, thyroid normal size, non-tender, without nodularity  LYMPH:  no palpable lymphadenopathy in the cervical, axillary or inguinal LUNGS: clear to auscultation and percussion with normal breathing effort HEART: regular rate & rhythm and no murmurs and no lower extremity edema ABDOMEN:abdomen soft, non-tender and normal bowel sounds MUSCULOSKELETAL:no cyanosis of digits and no clubbing  NEURO: alert  & oriented x 3 with fluent speech, no focal motor/sensory deficits EXTREMITIES: No lower extremity edema   LABORATORY DATA:  I have reviewed the data as listed CMP Latest Ref Rng & Units 08/10/2016 05/06/2015 12/21/2013  Glucose 65 - 99 mg/dL 604(V) 74 409(W)  BUN 6 - 24 mg/dL 10 10 9   Creatinine 0.57 - 1.00 mg/dL 1.19 1.47 8.29  Sodium 134 - 144 mmol/L 140 141 138  Potassium 3.5 - 5.2 mmol/L 3.4(L) 3.9 4.1  Chloride 96 - 106 mmol/L 102 107 105  CO2 18 - 29 mmol/L 22 26 27   Calcium 8.7 - 10.2 mg/dL 8.9 9.0 9.0  Total Protein 6.1 - 8.1 g/dL - 6.8 6.6  Total Bilirubin 0.2 - 1.2 mg/dL - 0.3 0.3  Alkaline Phos 33 - 115 U/L - 89 85  AST 10 - 30 U/L - 13 14  ALT 6 - 29 U/L - 14 19    Lab Results  Component Value Date   WBC 7.1 12/19/2017   HGB 12.0 12/19/2017   HCT 37.6 12/19/2017   MCV 83.4 12/19/2017   PLT 241 12/19/2017   NEUTROABS 4.5 12/19/2017    ASSESSMENT & PLAN:  Iron deficiency anemia due to chronic blood loss Chronic uterine bleeding heavy periods causing severe anemia, intolerant to oral iron therapy because of abdominal pain and gastritis.  IV iron: November 2018, March 2019, September 2019  Lab review: Hemoglobin 12, MCV 83.4, platelet count 241. Serum iron 27, TIBC 371, iron saturation 7%, ferritin less than 4 I discussed with the patient that she would likely need IV iron therapy if not she is going to get performed the anemic in a short period of time. We will arrange for IV iron to be given 2 doses 1 week apart. We do not know if there is any component of malabsorption in addition to the blood loss.  Return to clinic in 3 months with labs done 1 day before visit.    Orders Placed This Encounter  Procedures  . Iron and TIBC    Standing Status:   Future    Standing Expiration Date:   12/20/2018  . Ferritin    Standing Status:   Future    Standing Expiration Date:   12/20/2018  . CBC with Differential (Cancer Center Only)    Standing Status:   Future     Standing Expiration Date:   12/21/2018   The patient has a good understanding of the overall plan. she agrees with it. she will call with any problems that may develop before the next visit here.   Tamsen Meek, MD 12/20/17

## 2017-12-26 ENCOUNTER — Other Ambulatory Visit: Payer: Self-pay | Admitting: Hematology and Oncology

## 2017-12-27 ENCOUNTER — Inpatient Hospital Stay: Payer: BLUE CROSS/BLUE SHIELD

## 2017-12-27 VITALS — BP 111/71 | HR 71 | Temp 98.9°F | Resp 16

## 2017-12-27 DIAGNOSIS — N920 Excessive and frequent menstruation with regular cycle: Secondary | ICD-10-CM | POA: Diagnosis not present

## 2017-12-27 DIAGNOSIS — Z885 Allergy status to narcotic agent status: Secondary | ICD-10-CM | POA: Diagnosis not present

## 2017-12-27 DIAGNOSIS — D500 Iron deficiency anemia secondary to blood loss (chronic): Secondary | ICD-10-CM | POA: Diagnosis not present

## 2017-12-27 DIAGNOSIS — Z880 Allergy status to penicillin: Secondary | ICD-10-CM | POA: Diagnosis not present

## 2017-12-27 DIAGNOSIS — Z79899 Other long term (current) drug therapy: Secondary | ICD-10-CM | POA: Diagnosis not present

## 2017-12-27 MED ORDER — SODIUM CHLORIDE 0.9 % IV SOLN
INTRAVENOUS | Status: DC
  Administered 2017-12-27: 09:00:00 via INTRAVENOUS
  Filled 2017-12-27: qty 250

## 2017-12-27 MED ORDER — SODIUM CHLORIDE 0.9 % IV SOLN
510.0000 mg | Freq: Once | INTRAVENOUS | Status: AC
  Administered 2017-12-27: 510 mg via INTRAVENOUS
  Filled 2017-12-27: qty 17

## 2017-12-27 NOTE — Patient Instructions (Signed)

## 2018-03-16 NOTE — Progress Notes (Signed)
Patient Care Team: Doristine BosworthStallings, Zoe A, MD as PCP - General (Internal Medicine)  DIAGNOSIS:    ICD-10-CM   1. Iron deficiency anemia due to chronic blood loss D50.0 Iron and TIBC    Ferritin    CBC with Differential (Cancer Center Only)    CHIEF COMPLIANT: Follow-up of iron deficiency anemia  INTERVAL HISTORY: Sylvia PennerJessica Cantrell is a 42 y.o. with above-mentioned history of heavy menstrual bleeding related iron deficiency anemia who previously received IV iron therapy. Her most recent iron infusion was on 12/27/17. She presents to the clinic today alone. She notes she started her period one month ago and it never really stopped, as she has had consistent spotting over the past month. She notes she has a history of cysts, one that burst, and that she has an appointment to follow-up with her gynecologist in January to consider getting a hysterectomy. Her most recent labs show iron 44, iron saturation at 15%, TIBC at 288, Ferritin at 19, and Hg 14.0.  REVIEW OF SYSTEMS:   Constitutional: Denies fevers, chills or abnormal weight loss Eyes: Denies blurriness of vision Ears, nose, mouth, throat, and face: Denies mucositis or sore throat Respiratory: Denies cough, dyspnea or wheezes Cardiovascular: Denies palpitation, chest discomfort Gastrointestinal:  Denies nausea, heartburn or change in bowel habits GYN: (+) abnormal menstruation Skin: Denies abnormal skin rashes Lymphatics: Denies new lymphadenopathy or easy bruising Neurological:Denies numbness, tingling or new weaknesses Behavioral/Psych: Mood is stable, no new changes  Extremities: No lower extremity edema All other systems were reviewed with the patient and are negative.  I have reviewed the past medical history, past surgical history, social history and family history with the patient and they are unchanged from previous note.  ALLERGIES:  is allergic to amoxicillin and codeine.  MEDICATIONS:  Current Outpatient Medications    Medication Sig Dispense Refill  . albuterol (PROVENTIL HFA;VENTOLIN HFA) 108 (90 Base) MCG/ACT inhaler Inhale 2 puffs into the lungs every 6 (six) hours as needed for wheezing or shortness of breath. 1 Inhaler 2  . EPINEPHrine (EPIPEN) 0.3 mg/0.3 mL SOAJ injection Inject 0.3 mLs (0.3 mg total) into the muscle once. 2 Device 2  . Ferrous Sulfate 142 (45 Fe) MG TBCR Take 1 tablet by mouth daily. 30 tablet 0  . fluticasone (FLONASE) 50 MCG/ACT nasal spray Place 2 sprays into both nostrils daily. 16 g 6  . Fluticasone Furoate-Vilanterol (BREO ELLIPTA) 100-25 MCG/INH AEPB Inhale into the lungs.    Marland Kitchen. HYDROcodone-homatropine (HYCODAN) 5-1.5 MG/5ML syrup Take 5 mLs by mouth every 8 (eight) hours as needed for cough. 120 mL 0  . ibuprofen (ADVIL,MOTRIN) 200 MG tablet Take 200 mg by mouth every 6 (six) hours as needed.    Marland Kitchen. LORazepam (ATIVAN) 0.5 MG tablet Take 1/2-1 tab PO BID prn 30 tablet 1  . potassium chloride SA (K-DUR,KLOR-CON) 20 MEQ tablet Take 1 tablet (20 mEq total) by mouth daily. 30 tablet 0  . ranitidine (ZANTAC) 150 MG tablet Take 150 mg by mouth as needed for heartburn.     Current Facility-Administered Medications  Medication Dose Route Frequency Provider Last Rate Last Dose  . sodium chloride 0.9 % bolus 1,000 mL  1,000 mL Intravenous Once Collie SiadStallings, Zoe A, MD        PHYSICAL EXAMINATION: ECOG PERFORMANCE STATUS: 1 - Symptomatic but completely ambulatory  Vitals:   03/28/18 0943  BP: (!) 157/106  Pulse: 89  Resp: 18  Temp: 98.9 F (37.2 C)  SpO2: 97%   Filed Weights  03/28/18 0943  Weight: 245 lb 11.2 oz (111.4 kg)    GENERAL:alert, no distress and comfortable SKIN: skin color, texture, turgor are normal, no rashes or significant lesions EYES: normal, Conjunctiva are pink and non-injected, sclera clear OROPHARYNX:no exudate, no erythema and lips, buccal mucosa, and tongue normal  NECK: supple, thyroid normal size, non-tender, without nodularity LYMPH:  no palpable  lymphadenopathy in the cervical, axillary or inguinal LUNGS: clear to auscultation and percussion with normal breathing effort HEART: regular rate & rhythm and no murmurs and no lower extremity edema ABDOMEN:abdomen soft, non-tender and normal bowel sounds MUSCULOSKELETAL:no cyanosis of digits and no clubbing  NEURO: alert & oriented x 3 with fluent speech, no focal motor/sensory deficits EXTREMITIES: No lower extremity edema  LABORATORY DATA:  I have reviewed the data as listed CMP Latest Ref Rng & Units 08/10/2016 05/06/2015 12/21/2013  Glucose 65 - 99 mg/dL 161(W) 74 960(A)  BUN 6 - 24 mg/dL 10 10 9   Creatinine 0.57 - 1.00 mg/dL 5.40 9.81 1.91  Sodium 134 - 144 mmol/L 140 141 138  Potassium 3.5 - 5.2 mmol/L 3.4(L) 3.9 4.1  Chloride 96 - 106 mmol/L 102 107 105  CO2 18 - 29 mmol/L 22 26 27   Calcium 8.7 - 10.2 mg/dL 8.9 9.0 9.0  Total Protein 6.1 - 8.1 g/dL - 6.8 6.6  Total Bilirubin 0.2 - 1.2 mg/dL - 0.3 0.3  Alkaline Phos 33 - 115 U/L - 89 85  AST 10 - 30 U/L - 13 14  ALT 6 - 29 U/L - 14 19    Lab Results  Component Value Date   WBC 8.3 03/27/2018   HGB 14.0 03/27/2018   HCT 43.0 03/27/2018   MCV 86.9 03/27/2018   PLT 219 03/27/2018   NEUTROABS 5.1 03/27/2018    ASSESSMENT & PLAN:  Iron deficiency anemia due to chronic blood loss Chronic uterine bleeding heavy periods causing severe anemia, intolerant to oral iron therapy because of abdominal pain and gastritis.  Probably also a component of malabsorption.  IV iron: November 2018, March 2019, September 2019  Lab review 03/27/2018: Iron saturation 15%, TIBC 288, ferritin 19, hemoglobin is 14 Based on the excellent blood work, she does not need any IV iron at this time.  Patient is contemplating on having a hysterectomy.  She is seeing her gynecologist in January. She is currently taking 35 mg of iron along with vitamin C.  Plan to see her back in 3 months with labs done ahead of time.  If those results are also good  then we may see her less often.    Orders Placed This Encounter  Procedures  . Iron and TIBC    Standing Status:   Future    Standing Expiration Date:   03/28/2019  . Ferritin    Standing Status:   Future    Standing Expiration Date:   03/28/2019  . CBC with Differential (Cancer Center Only)    Standing Status:   Future    Standing Expiration Date:   03/29/2019   The patient has a good understanding of the overall plan. she agrees with it. she will call with any problems that may develop before the next visit here.  Serena Croissant, MD 03/28/2018   I, Kirt Boys Dorshimer, am acting as scribe for Serena Croissant, MD.  I have reviewed the above documentation for accuracy and completeness, and I agree with the above.

## 2018-03-27 ENCOUNTER — Inpatient Hospital Stay: Payer: BLUE CROSS/BLUE SHIELD | Attending: Hematology and Oncology

## 2018-03-27 DIAGNOSIS — Z880 Allergy status to penicillin: Secondary | ICD-10-CM | POA: Diagnosis not present

## 2018-03-27 DIAGNOSIS — D500 Iron deficiency anemia secondary to blood loss (chronic): Secondary | ICD-10-CM | POA: Insufficient documentation

## 2018-03-27 DIAGNOSIS — Z885 Allergy status to narcotic agent status: Secondary | ICD-10-CM | POA: Insufficient documentation

## 2018-03-27 DIAGNOSIS — N920 Excessive and frequent menstruation with regular cycle: Secondary | ICD-10-CM | POA: Insufficient documentation

## 2018-03-27 DIAGNOSIS — Z79899 Other long term (current) drug therapy: Secondary | ICD-10-CM | POA: Diagnosis not present

## 2018-03-27 DIAGNOSIS — Z791 Long term (current) use of non-steroidal anti-inflammatories (NSAID): Secondary | ICD-10-CM | POA: Insufficient documentation

## 2018-03-27 LAB — CBC WITH DIFFERENTIAL (CANCER CENTER ONLY)
Abs Immature Granulocytes: 0.01 10*3/uL (ref 0.00–0.07)
BASOS ABS: 0.1 10*3/uL (ref 0.0–0.1)
Basophils Relative: 1 %
EOS ABS: 0.3 10*3/uL (ref 0.0–0.5)
EOS PCT: 3 %
HEMATOCRIT: 43 % (ref 36.0–46.0)
HEMOGLOBIN: 14 g/dL (ref 12.0–15.0)
Immature Granulocytes: 0 %
LYMPHS PCT: 28 %
Lymphs Abs: 2.3 10*3/uL (ref 0.7–4.0)
MCH: 28.3 pg (ref 26.0–34.0)
MCHC: 32.6 g/dL (ref 30.0–36.0)
MCV: 86.9 fL (ref 80.0–100.0)
Monocytes Absolute: 0.6 10*3/uL (ref 0.1–1.0)
Monocytes Relative: 7 %
NRBC: 0 % (ref 0.0–0.2)
Neutro Abs: 5.1 10*3/uL (ref 1.7–7.7)
Neutrophils Relative %: 61 %
Platelet Count: 219 10*3/uL (ref 150–400)
RBC: 4.95 MIL/uL (ref 3.87–5.11)
RDW: 14.3 % (ref 11.5–15.5)
WBC Count: 8.3 10*3/uL (ref 4.0–10.5)

## 2018-03-27 LAB — IRON AND TIBC
Iron: 44 ug/dL (ref 41–142)
SATURATION RATIOS: 15 % — AB (ref 21–57)
TIBC: 288 ug/dL (ref 236–444)
UIBC: 243 ug/dL (ref 120–384)

## 2018-03-27 LAB — FERRITIN: Ferritin: 19 ng/mL (ref 11–307)

## 2018-03-28 ENCOUNTER — Inpatient Hospital Stay: Payer: BLUE CROSS/BLUE SHIELD

## 2018-03-28 ENCOUNTER — Inpatient Hospital Stay (HOSPITAL_BASED_OUTPATIENT_CLINIC_OR_DEPARTMENT_OTHER): Payer: BLUE CROSS/BLUE SHIELD | Admitting: Hematology and Oncology

## 2018-03-28 ENCOUNTER — Telehealth: Payer: Self-pay | Admitting: Hematology and Oncology

## 2018-03-28 DIAGNOSIS — D500 Iron deficiency anemia secondary to blood loss (chronic): Secondary | ICD-10-CM

## 2018-03-28 DIAGNOSIS — Z79899 Other long term (current) drug therapy: Secondary | ICD-10-CM | POA: Diagnosis not present

## 2018-03-28 DIAGNOSIS — Z791 Long term (current) use of non-steroidal anti-inflammatories (NSAID): Secondary | ICD-10-CM

## 2018-03-28 DIAGNOSIS — N920 Excessive and frequent menstruation with regular cycle: Secondary | ICD-10-CM

## 2018-03-28 DIAGNOSIS — Z880 Allergy status to penicillin: Secondary | ICD-10-CM

## 2018-03-28 DIAGNOSIS — Z885 Allergy status to narcotic agent status: Secondary | ICD-10-CM

## 2018-03-28 NOTE — Assessment & Plan Note (Addendum)
Chronic uterine bleeding heavy periods causing severe anemia, intolerant to oral iron therapy because of abdominal pain and gastritis.  Probably also a component of malabsorption.  IV iron: November 2018, March 2019, September 2019  Lab review 03/27/2018: Iron saturation 15%, TIBC 288, ferritin 19, hemoglobin is 14 Based on the excellent blood work, she does not need any IV iron at this time.  Patient is contemplating on having a hysterectomy.  She is seeing her gynecologist in January. She is currently taking 35 mg of iron along with vitamin C.  Plan to see her back in 3 months with labs done ahead of time.  If those results are also good then we may see her less often.

## 2018-03-28 NOTE — Telephone Encounter (Signed)
Gave avs and calendar ° °

## 2018-05-02 DIAGNOSIS — Z1231 Encounter for screening mammogram for malignant neoplasm of breast: Secondary | ICD-10-CM | POA: Diagnosis not present

## 2018-05-02 DIAGNOSIS — Z124 Encounter for screening for malignant neoplasm of cervix: Secondary | ICD-10-CM | POA: Diagnosis not present

## 2018-05-02 DIAGNOSIS — D649 Anemia, unspecified: Secondary | ICD-10-CM | POA: Insufficient documentation

## 2018-05-02 DIAGNOSIS — Z01419 Encounter for gynecological examination (general) (routine) without abnormal findings: Secondary | ICD-10-CM | POA: Diagnosis not present

## 2018-05-02 DIAGNOSIS — Z6839 Body mass index (BMI) 39.0-39.9, adult: Secondary | ICD-10-CM | POA: Diagnosis not present

## 2018-06-19 DIAGNOSIS — N920 Excessive and frequent menstruation with regular cycle: Secondary | ICD-10-CM | POA: Diagnosis not present

## 2018-06-23 ENCOUNTER — Inpatient Hospital Stay: Payer: BLUE CROSS/BLUE SHIELD | Attending: Hematology and Oncology

## 2018-06-27 ENCOUNTER — Inpatient Hospital Stay: Payer: BLUE CROSS/BLUE SHIELD | Admitting: Hematology and Oncology

## 2018-07-10 ENCOUNTER — Other Ambulatory Visit: Payer: Self-pay | Admitting: Family Medicine

## 2018-07-10 DIAGNOSIS — J4520 Mild intermittent asthma, uncomplicated: Secondary | ICD-10-CM

## 2018-07-10 DIAGNOSIS — F418 Other specified anxiety disorders: Secondary | ICD-10-CM

## 2018-07-13 ENCOUNTER — Telehealth: Payer: Self-pay | Admitting: Family Medicine

## 2018-07-13 NOTE — Telephone Encounter (Signed)
Copied from CRM [id number]. Topic: General - Other >> Jul 13, 2018  8:39 AM Gwenlyn Fudge A wrote: Reason for CRM: Pt called requesting LORazepam (ATIVAN) 0.5 MG tablet. Pt states she doesn't use it often, but she needs it now. Pt is asking if an appt needs to be made or if the request for refill can just be sent in. Please advise with next steps.  [id number] [place], [place] - [address] AT [facility] [address] [place] [address] Phone: [phone] Fax: [phone] Not a 24 hour pharmacy; exact hours not known.

## 2018-07-13 NOTE — Telephone Encounter (Signed)
Please schedule tele med with patient due to has not been filled since 2018 and haven't been here in awhile nor discussed in awhile. Thanks

## 2018-09-07 NOTE — Telephone Encounter (Signed)
APPNT HAS BEEN SCHEDULED

## 2018-09-12 ENCOUNTER — Other Ambulatory Visit: Payer: Self-pay

## 2018-09-12 ENCOUNTER — Encounter: Payer: Self-pay | Admitting: Family Medicine

## 2018-09-12 ENCOUNTER — Telehealth (INDEPENDENT_AMBULATORY_CARE_PROVIDER_SITE_OTHER): Payer: BLUE CROSS/BLUE SHIELD | Admitting: Family Medicine

## 2018-09-12 VITALS — BP 159/107 | HR 87 | Temp 98.4°F | Ht 66.0 in | Wt 228.0 lb

## 2018-09-12 DIAGNOSIS — R030 Elevated blood-pressure reading, without diagnosis of hypertension: Secondary | ICD-10-CM

## 2018-09-12 DIAGNOSIS — F411 Generalized anxiety disorder: Secondary | ICD-10-CM

## 2018-09-12 DIAGNOSIS — F418 Other specified anxiety disorders: Secondary | ICD-10-CM

## 2018-09-12 DIAGNOSIS — J4520 Mild intermittent asthma, uncomplicated: Secondary | ICD-10-CM

## 2018-09-12 MED ORDER — LORAZEPAM 0.5 MG PO TABS: ORAL_TABLET | ORAL | 0 refills | Status: DC

## 2018-09-12 NOTE — Progress Notes (Signed)
Reviewed controlled substances history

## 2018-09-12 NOTE — Progress Notes (Signed)
Telemedicine Encounter- SOAP NOTE Established Patient  I discussed the limitations, risks, security and privacy concerns of performing an evaluation and management service by telephone and the availability of in person appointments. I also discussed with the patient that there may be a patient responsible charge related to this service. The patient expressed understanding and agreed to proceed.  This telephone encounter was conducted with the patient's (or proxy's) verbal consent via audio telecommunications: yes Patient was instructed to have this encounter in a suitably private space; and to only have persons present to whom they give permission to participate. In addition, patient identity was confirmed by use of name plus two identifiers (DOB and address).  I spent a total of talking with the patient or their proxy.  Chief Complaint: depression anxiety and med refill ( lorazepam) pending    PFQ 9 score = 3 and GAD 7 score = 10   GAD 7 Questionnaire Over the last 2 weeks, how often have you been bothered by the following problems?   Not at all sure = 0   Several days = 1   Over half the days =2   Nearly every day =3  1.  Feeling nervous, anxious, or on edge - 2 2.  Not being able to stop or control worrying -2 3.  Worrying too much about different things -1 4.  Trouble relaxing -1 5.  Being so restless that it's hard to sit still - 1 6.  Becoming easily annoyed or irritable -1 7.  Feeling afraid as if something awful might happen - 2 Total Score (add your column scores) =    10       If you checked off any problems, how difficult have these made it for you to do your work, take care of things at home, or get along with other people?  Somewhat difficult           Subjective   Sylvia Cantrell is a 43 y.o. female established patient. 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 Life long anxiety with use of Benzo in the past 30/year-ativan. Pt states she has tried daily medication but does not need medication most of the time. Pt with iron deficiency anemia-ablation in March with no follow up due to COVID. Pt states good support system. Pt using breo for asthma. Allergies are a trigger but she is no using additional albuterol to control symptoms. Pt has noted increase in blood pressure today on home monitor. Pt had not taken bp in several months. Pt states she always has chest tightness with anxiety. No nausea. No headaches currently  Patient Active Problem List   Diagnosis Date Noted  . Neck pain 06/21/2017  . Anterior pleuritic pain 06/21/2017  . Iron deficiency anemia due to chronic blood loss 02/28/2017  . Situational anxiety 11/17/2016  . Menorrhagia with regular cycle 08/11/2016  . Anemia due to chronic blood loss 08/11/2016  . NEPHROLITHIASIS, HX OF 02/15/2010  . ALLERGIC RHINITIS 02/11/2010  . Asthma 02/11/2010    Past Medical History:  Diagnosis Date  . Allergy   . Anemia   . Anemia   . Anxiety   . Asthma   . Chronic headache   . Depression   . Hypertension   . IBS (irritable bowel  syndrome)   . Kidney stones   . Nephrolithiasis   . Obesity   . Seizures (HCC)     Current Outpatient Medications  Medication Sig Dispense Refill  . albuterol (PROVENTIL HFA;VENTOLIN HFA) 108 (90 Base) MCG/ACT inhaler Inhale 2 puffs into the lungs every 6 (six) hours as needed for wheezing or shortness of breath. 1 Inhaler 2  . EPINEPHrine (EPIPEN) 0.3 mg/0.3 mL SOAJ injection Inject 0.3 mLs (0.3 mg total) into the muscle once. 2 Device 2  . fluticasone (FLONASE) 50 MCG/ACT nasal spray Place 2 sprays into both nostrils daily. 16 g 6  . Fluticasone Furoate-Vilanterol (BREO ELLIPTA) 100-25 MCG/INH AEPB Inhale into the lungs.    Marland Kitchen ibuprofen (ADVIL,MOTRIN) 200 MG tablet Take 200 mg  by mouth every 6 (six) hours as needed.    Marland Kitchen LORazepam (ATIVAN) 0.5 MG tablet Take 1/2-1 tab PO BID prn 30 tablet 1  . Ferrous Sulfate 142 (45 Fe) MG TBCR Take 1 tablet by mouth daily. (Patient not taking: Reported on 09/12/2018) 30 tablet 0  . HYDROcodone-homatropine (HYCODAN) 5-1.5 MG/5ML syrup Take 5 mLs by mouth every 8 (eight) hours as needed for cough. (Patient not taking: Reported on 09/12/2018) 120 mL 0  . potassium chloride SA (K-DUR,KLOR-CON) 20 MEQ tablet Take 1 tablet (20 mEq total) by mouth daily. (Patient not taking: Reported on 09/12/2018) 30 tablet 0  . ranitidine (ZANTAC) 150 MG tablet Take 150 mg by mouth as needed for heartburn.     Current Facility-Administered Medications  Medication Dose Route Frequency Provider Last Rate Last Dose  . sodium chloride 0.9 % bolus 1,000 mL  1,000 mL Intravenous Once Doristine Bosworth, MD        Allergies  Allergen Reactions  . Amoxicillin     Hives with first dose  . Codeine     REACTION: itching    Social History   Socioeconomic History  . Marital status: Married    Spouse name: Not on file  . Number of children: Not on file  . Years of education: Not on file  . Highest education level: Not on file  Occupational History  . Not on file  Social Needs  . Financial resource strain: Not on file  . Food insecurity:    Worry: Not on file    Inability: Not on file  . Transportation needs:    Medical: Not on file    Non-medical: Not on file  Tobacco Use  . Smoking status: Never Smoker  . Smokeless tobacco: Never Used  Substance and Sexual Activity  . Alcohol use: No    Alcohol/week: 0.0 standard drinks  . Drug use: No  . Sexual activity: Never  Lifestyle  . Physical activity:    Days per week: Not on file    Minutes per session: Not on file  . Stress: Not on file  Relationships  . Social connections:    Talks on phone: Not on file    Gets together: Not on file    Attends religious service: Not on file    Active member of  club or organization: Not on file    Attends meetings of clubs or organizations: Not on file    Relationship status: Not on file  . Intimate partner violence:    Fear of current or ex partner: Not on file    Emotionally abused: Not on file    Physically abused: Not on file    Forced sexual activity: Not on file  Other Topics  Concern  . Not on file  Social History Narrative  . Not on file    ROS CONSTITUTIONAL: no  Fever EENT: nasal congestion, watery eyes CV: chest tightness-associated with anxiety RESP:  Cough-inhaler not used for rescue lately GI: no heartburn, constipation, diarrhea, food intolerance, abdominal pain, difficulty swallowing, nausea, vomiting, blood in stool, bowel changes GU: 3/11 ablation -follow up post ablation-6/12-cramping and intermittent spotting PSY:  anxiety, mood swings HEME iron infusions in the past-appt cancelled follow up ALLERGY:seasonal allergies, itching   Objective   Vitals as reported by the patient: Today's Vitals   09/12/18 0811 09/12/18 0824  BP: (!) 170/113 (!) 159/107  Pulse: 87   Temp: 98.4 F (36.9 C)   TempSrc: Oral   Weight: 228 lb (103.4 kg)   Height: 5\' 6"  (1.676 m)      1. Situational anxiety D/w pt addictive potential-pt understands if increase use of medication it would be important to consider taking daily medication until COVID stabilized as this primary trigger for her anxiety currently. Overwhelmed with home teaching children + work responsibilities.  Pt will schedule an appt for follow up if additional refills needed for this concern - LORazepam (ATIVAN) 0.5 MG tablet; Take 1/2-1 tab PO BID prn  Dispense: 30 tablet; Refill: 0  2. Elevated blood pressure reading Pt will repeat bp daily-if continued increase will schedule a follow up appt. Pt can use mychart for information related to bp elevation and need for ongoing follow up  3. Mild intermittent asthma without complication  no current exacerbation  I  discussed the assessment and treatment plan with the patient. The patient was provided an opportunity to ask questions and all were answered. The patient agreed with the plan and demonstrated an understanding of the instructions.   The patient was advised to call back or seek an in-person evaluation if the symptoms worsen or if the condition fails to improve as anticipated.  I provided  20 minutes of non-face-to-face time during this encounter.   Mat CarneLEIGH , MD  [facility] 09-12-18

## 2018-09-12 NOTE — Progress Notes (Signed)
Chief Complaint: depression anxiety and med refill ( lorazepam) pending    PFQ 9 score = 3 and GAD 7 score = 10   GAD 7 Questionnaire Over the last 2 weeks, how often have you been bothered by the following problems?   Not at all sure = 0   Several days = 1   Over half the days =2   Nearly every day =3  1.  Feeling nervous, anxious, or on edge - 2 2.  Not being able to stop or control worrying -2 3.  Worrying too much about different things -1 4.  Trouble relaxing -1 5.  Being so restless that it's hard to sit still - 1 6.  Becoming easily annoyed or irritable -1 7.  Feeling afraid as if something awful might happen - 2 Total Score (add your column scores) =    10       If you checked off any problems, how difficult have these made it for you to do your work, take care of things at home, or get along with other people?  Somewhat difficult

## 2018-11-06 ENCOUNTER — Other Ambulatory Visit: Payer: Self-pay

## 2018-11-06 ENCOUNTER — Encounter (HOSPITAL_COMMUNITY): Payer: Self-pay

## 2018-11-06 ENCOUNTER — Ambulatory Visit (HOSPITAL_COMMUNITY)
Admission: EM | Admit: 2018-11-06 | Discharge: 2018-11-06 | Disposition: A | Discharge status: Home / Self Care | Payer: BC Managed Care – PPO | Attending: Emergency Medicine | Admitting: Emergency Medicine

## 2018-11-06 DIAGNOSIS — W57XXXA Bitten or stung by nonvenomous insect and other nonvenomous arthropods, initial encounter: Secondary | ICD-10-CM

## 2018-11-06 DIAGNOSIS — T63461A Toxic effect of venom of wasps, accidental (unintentional), initial encounter: Secondary | ICD-10-CM | POA: Diagnosis not present

## 2018-11-06 MED ORDER — PREDNISONE 10 MG (21) PO TBPK: ORAL_TABLET | Freq: Every day | ORAL | 0 refills | Status: DC

## 2018-11-06 MED ORDER — DOXYCYCLINE HYCLATE 100 MG PO CAPS: 100.0000 mg | ORAL_CAPSULE | Freq: Two times a day (BID) | ORAL | 0 refills | Status: DC
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
## 2018-11-06 NOTE — Discharge Instructions (Signed)
Take steroid as directed. May take doxycycline if no improvement in 24-48 hours. May take benadryl at night.  Return if you develop fever, worsening rash, chest pain, shortness of breath, numbness hands/feet.

## 2018-11-06 NOTE — ED Provider Notes (Signed)
[facility]    CSN: [id number] Arrival date & time: 11/06/18  [id number]     History   Chief Complaint Chief Complaint  Patient presents with  . Rash  . Insect Bite    HPI Sylvia PennerJessica Lysne is a 43 y.o. female with history of allergies, asthma, hypertension presenting for multiple wasp stings.  Patient states that this happened on 7/25, and affected her left arm and legs bilaterally.  Patient has tried topical hydrocortisone, Benadryl, oral Benadryl with mild relief of symptoms.  Patient worsening pruritus, erythema, warmth.  No fever, arthralgias, myalgias, shortness of breath, angioedema.    Past Medical History:  Diagnosis Date  . Allergy   . Anemia   . Anemia   . Anxiety   . Asthma   . Chronic headache   . Depression   . Hypertension   . IBS (irritable bowel syndrome)   . Kidney stones   . Nephrolithiasis   . Obesity   . Seizures [facility](H[facility])     Patient Active Problem List   Diagnosis Date Noted  . Elevated blood pressure reading 09/12/2018  . Mild intermittent asthma without complication 09/12/2018  . Neck pain 06/21/2017  . Anterior pleuritic pain 06/21/2017  . Iron deficiency anemia due to chronic blood loss 02/28/2017  . Situational anxiety 11/17/2016  . Menorrhagia with regular cycle 08/11/2016  . Anemia due to chronic blood loss 08/11/2016  . NEPHROLITHIASIS, HX OF 02/15/2010  . ALLERGIC RHINITIS 02/11/2010  . Asthma 02/11/2010    Past Surgical History:  Procedure Laterality Date  . ABLATION    . BREAST SURGERY    . TONSILLECTOMY  1987    OB History   No obstetric history on file.      Home Medications    Prior to Admission medications   Medication Sig Start Date End Date Taking? Authorizing Provider  albuterol (PROVENTIL HFA;VENTOLIN HFA) 108 (90 Base) MCG/ACT inhaler Inhale 2 puffs into the lungs every 6 (six) hours as needed for wheezing or shortness of breath. 05/06/15   Emi BelfastGessner, Deborah B, FNP  doxycycline (VIBRAMYCIN) 100 MG capsule  Take 1 capsule (100 mg total) by mouth 2 (two) times daily. 11/06/18   Hall-Potvin, [place], PA-C  EPINEPHrine (EPIPEN) 0.3 mg/0.3 mL SOAJ injection Inject 0.3 mLs (0.3 mg total) into the muscle once. 03/12/13   Peyton NajjarHopper, David H, MD  Ferrous Sulfate 142 (45 Fe) MG TBCR Take 1 tablet by mouth daily. Patient not taking: Reported on 09/12/2018 08/11/16   Doristine BosworthStallings, Zoe A, MD  fluticasone (FLONASE) 50 MCG/ACT nasal spray Place 2 sprays into both nostrils daily. 10/27/17   Doristine BosworthStallings, Zoe A, MD  Fluticasone Furoate-Vilanterol (BREO ELLIPTA) 100-25 MCG/INH AEPB Inhale into the lungs.    [provider]  HYDROcodone-homatropine (HYCODAN) 5-1.5 MG/5ML syrup Take 5 mLs by mouth every 8 (eight) hours as needed for cough. Patient not taking: Reported on 09/12/2018 10/27/17   Doristine BosworthStallings, Zoe A, MD  ibuprofen (ADVIL,MOTRIN) 200 MG tablet Take 200 mg by mouth every 6 (six) hours as needed.    [provider]  LORazepam (ATIVAN) 0.5 MG tablet Take 1/2-1 tab PO BID prn 09/12/18   Corum, Minerva FesterLisa L, MD  potassium chloride SA (K-DUR,KLOR-CON) 20 MEQ tablet Take 1 tablet (20 mEq total) by mouth daily. Patient not taking: Reported on 09/12/2018 08/11/16   Doristine BosworthStallings, Zoe A, MD  predniSONE (STERAPRED UNI-PAK 21 TAB) 10 MG (21) TBPK tablet Take by mouth daily. Take steroid taper pack as directed 11/06/18   Hall-Potvin, [place],  PA-C  ranitidine (ZANTAC) 150 MG tablet Take 150 mg by mouth as needed for heartburn.    [provider]    Family History Family History  Problem Relation Age of Onset  . Hypertension Mother   . Hyperthyroidism Mother   . Heart failure Father   . Heart failure Maternal Grandmother   . Hypertension Maternal Grandmother   . Kidney disease Maternal Grandmother   . Heart failure Paternal Grandfather     Social History Social History   Tobacco Use  . Smoking status: Never Smoker  . Smokeless tobacco: Never Used  Substance Use Topics  . Alcohol use: No    Alcohol/week: 0.0  standard drinks  . Drug use: No     Allergies   Amoxicillin and Codeine   Review of Systems Review of Systems  Constitutional: Negative for fatigue and fever.  HENT: Negative for ear pain, sinus pain, sore throat and voice change.   Eyes: Negative for pain, redness and visual disturbance.  Respiratory: Negative for cough and shortness of breath.   Cardiovascular: Negative for chest pain and palpitations.  Gastrointestinal: Negative for abdominal pain, diarrhea and vomiting.  Musculoskeletal: Negative for arthralgias and myalgias.  Skin: Positive for color change and rash. Negative for wound.  Neurological: Negative for syncope and headaches.     Physical Exam Triage Vital Signs ED Triage Vitals  Enc Vitals Group     BP 11/06/18 1023 137/90     Pulse Rate 11/06/18 1023 92     Resp 11/06/18 1023 18     Temp 11/06/18 1023 98 F (36.7 C)     Temp Source 11/06/18 1023 Temporal     SpO2 11/06/18 1023 97 %     Weight --      Height --      Head Circumference --      Peak Flow --      Pain Score 11/06/18 1025 4     Pain Loc --      Pain Edu? --      Excl. in [place]? --    No data found.  Updated Vital Signs BP 137/90 (BP Location: Right Arm)   Pulse 92   Temp 98 F (36.7 C) (Temporal)   Resp 18   SpO2 97%   Visual Acuity Right Eye Distance:   Left Eye Distance:   Bilateral Distance:    Right Eye Near:   Left Eye Near:    Bilateral Near:     Physical Exam Constitutional:      General: She is not in acute distress. HENT:     Head: Normocephalic and atraumatic.  Eyes:     General: No scleral icterus.    Pupils: Pupils are equal, round, and reactive to light.  Cardiovascular:     Rate and Rhythm: Normal rate.  Pulmonary:     Effort: Pulmonary effort is normal.  Skin:    Capillary Refill: Capillary refill takes less than 2 seconds.     Coloration: Skin is not jaundiced or pale.     Findings: Lesion present.     Comments: Large lesion of erythema and edema  on left arm extending from bicep to elbow.  2 lesions on left lower leg, one lesion on right lower leg.  Areas marked by provider.  Scant vesicular lesions in central aspect of to lower leg lesions.  Tender and warm to palpation.  No open wound  Neurological:     General: No focal deficit present.  Mental Status: She is alert and oriented to person, place, and time.      UC Treatments / Results  Labs (all labs ordered are listed, but only abnormal results are displayed) Labs Reviewed - No data to display  EKG   Radiology No results found.  Procedures Procedures (including critical care time)  Medications Ordered in UC Medications - No data to display  Initial Impression / Assessment and Plan / UC Course  I have reviewed the triage vital signs and the nursing notes.  Pertinent labs & imaging results that were available during my care of the patient were reviewed by me and considered in my medical decision making (see chart for details).     1.  Insect bite Likely histamine response second to double wasp stings.  Will treat with steroid burst.  There is some concern for possible cellulitis given extent of lesions.  Paper prescription provided for doxycycline with strict fill and return precautions as outlined below.  Patient verbalized understanding and is agreeable to plan. Final Clinical Impressions(s) / UC Diagnoses   Final diagnoses:  Insect bite, unspecified site, initial encounter     Discharge Instructions     Take steroid as directed. May take doxycycline if no improvement in 24-48 hours. May take benadryl at night.  Return if you develop fever, worsening rash, chest pain, shortness of breath, numbness hands/feet.    ED Prescriptions    Medication Sig Dispense Auth. Provider   predniSONE (STERAPRED UNI-PAK 21 TAB) 10 MG (21) TBPK tablet Take by mouth daily. Take steroid taper pack as directed 21 tablet Hall-Potvin, [place], PA-C   doxycycline  (VIBRAMYCIN) 100 MG capsule Take 1 capsule (100 mg total) by mouth 2 (two) times daily. 20 capsule Hall-Potvin, [place], PA-C     Controlled Substance Prescriptions [place] Controlled Substance Registry consulted? Not Applicable   Shea EvansHall-Potvin, , [place] JerseyPA-C 11/06/18 1144

## 2019-02-01 ENCOUNTER — Other Ambulatory Visit: Payer: Self-pay

## 2019-02-01 DIAGNOSIS — Z20828 Contact with and (suspected) exposure to other viral communicable diseases: Secondary | ICD-10-CM | POA: Diagnosis not present

## 2019-02-01 DIAGNOSIS — Z20822 Contact with and (suspected) exposure to covid-19: Secondary | ICD-10-CM

## 2019-02-03 LAB — NOVEL CORONAVIRUS, NAA: SARS-CoV-2, NAA: NOT DETECTED

## 2019-03-20 ENCOUNTER — Other Ambulatory Visit: Payer: Self-pay

## 2019-03-20 ENCOUNTER — Encounter: Payer: Self-pay | Admitting: Family Medicine

## 2019-03-20 ENCOUNTER — Ambulatory Visit: Payer: BC Managed Care – PPO | Admitting: Family Medicine

## 2019-03-20 VITALS — BP 141/100 | HR 94 | Temp 98.1°F | Ht 66.0 in | Wt 252.4 lb

## 2019-03-20 DIAGNOSIS — F411 Generalized anxiety disorder: Secondary | ICD-10-CM

## 2019-03-20 DIAGNOSIS — I10 Essential (primary) hypertension: Secondary | ICD-10-CM

## 2019-03-20 DIAGNOSIS — K219 Gastro-esophageal reflux disease without esophagitis: Secondary | ICD-10-CM | POA: Diagnosis not present

## 2019-03-20 MED ORDER — ESCITALOPRAM OXALATE 20 MG PO TABS: 20.0000 mg | ORAL_TABLET | Freq: Every day | ORAL | 2 refills | Status: DC

## 2019-03-20 MED ORDER — AMLODIPINE BESYLATE 5 MG PO TABS: 5.0000 mg | ORAL_TABLET | Freq: Every day | ORAL | 3 refills | Status: DC

## 2019-03-20 MED ORDER — FAMOTIDINE 20 MG PO TABS: 20.0000 mg | ORAL_TABLET | Freq: Two times a day (BID) | ORAL | 2 refills | Status: DC

## 2019-03-20 NOTE — Patient Instructions (Signed)
° ° ° °  If you have lab work done today you will be contacted with your lab results within the next 2 weeks.  If you have not heard from us then please contact us. The fastest way to get your results is to register for My Chart. ° ° °IF you received an x-ray today, you will receive an invoice from [facility]. Please contact [facility] at [phone] with questions or concerns regarding your invoice.  ° °IF you received labwork today, you will receive an invoice from [facility]. Please contact [facility] at [phone] with questions or concerns regarding your invoice.  ° °Our billing staff will not be able to assist you with questions regarding bills from these companies. ° °You will be contacted with the lab results as soon as they are available. The fastest way to get your results is to activate your My Chart account. Instructions are located on the last page of this paperwork. If you have not heard from us regarding the results in 2 weeks, please contact this office. °  ° ° ° °

## 2019-03-20 NOTE — Progress Notes (Signed)
12/8/20203:21 PM  Sylvia Cantrell 1975/10/14, 43 y.o., female [id number]  Chief Complaint  Patient presents with  . Gastroesophageal Reflux  . Anxiety    starting to effect bp    HPI:   Patient is a 43 y.o. 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 Having sign worsening of reflux x 2 weeks, worse at night, has not noticed any triggers, better with water Used to take zantac regualrly, occ takes prilosec, but sometimes can make it worse, tums does not help No black tarry stools, mild constant epigastric pain, she does have nausea, no vomiting No chest pain, no worsening SOB, no palpitations No vision changes, does have h/o sporadic migraines with auras, no foca weakness Denies any excessive daytime sleepiness, no fatigue    Depression screen [facility] 2/9 03/20/2019 09/12/2018 10/27/2017  Decreased Interest 0 0 0  Down, Depressed, Hopeless 0 1 0  PHQ - 2 Score 0 1 0  Altered sleeping - 1 -  Tired, decreased energy - 0 -  Change in appetite - 0 -  Feeling bad or failure about yourself  - 0 -  Trouble concentrating - 1 -  Moving slowly or fidgety/restless - 0 -  Suicidal thoughts - 0 -  PHQ-9 Score - 3 -  Difficult doing work/chores - Somewhat difficult -    Fall Risk  03/20/2019 09/12/2018 10/27/2017 06/21/2017 02/23/2017  Falls in the past year? 0 0 No No No  Number falls in past yr: 0 0 - - -  Injury with Fall? 0 0 - - -     Allergies  Allergen Reactions  . Amoxicillin     Hives with first dose  . Codeine     REACTION: itching    Prior to Admission medications   Medication Sig Start Date End Date Taking? Authorizing Provider  albuterol (PROVENTIL HFA;VENTOLIN HFA) 108 (90 Base) MCG/ACT inhaler Inhale 2 puffs into the lungs every 6 (six) hours as  needed for wheezing or shortness of breath. 05/06/15  Yes Emi Belfast, FNP  EPINEPHrine (EPIPEN) 0.3 mg/0.3 mL SOAJ injection Inject 0.3 mLs (0.3 mg total) into the muscle once. 03/12/13  Yes Peyton Najjar, MD  fluticasone (FLONASE) 50 MCG/ACT nasal spray Place 2 sprays into both nostrils daily. 10/27/17  Yes Stallings, Zoe A, MD  Fluticasone Furoate-Vilanterol (BREO ELLIPTA) 100-25 MCG/INH AEPB Inhale into the lungs.   Yes [provider]  HYDROcodone-homatropine (HYCODAN) 5-1.5 MG/5ML syrup Take 5 mLs by mouth every 8 (eight) hours as needed for cough. 10/27/17  Yes Collie Siad A, MD  ibuprofen (ADVIL,MOTRIN) 200 MG tablet Take 200 mg by mouth every 6 (six) hours as needed.   Yes [provider]  LORazepam (ATIVAN) 0.5 MG tablet Take 1/2-1 tab PO BID prn 09/12/18  Yes Corum, Minerva Fester, MD    Past Medical History:  Diagnosis Date  . Allergy   . Anemia   . Anemia   . Anxiety   . Asthma   . Chronic headache   . Depression   . Hypertension   . IBS (irritable bowel syndrome)   . Kidney stones   . Nephrolithiasis   . Obesity   . Seizures (HCC)     Past Surgical History:  Procedure Laterality Date  .  ABLATION    . BREAST SURGERY    . TONSILLECTOMY  1987    Social History   Tobacco Use  . Smoking status: Never Smoker  . Smokeless tobacco: Never Used  Substance Use Topics  . Alcohol use: No    Alcohol/week: 0.0 standard drinks    Family History  Problem Relation Age of Onset  . Hypertension Mother   . Hyperthyroidism Mother   . Heart failure Father   . Heart failure Maternal Grandmother   . Hypertension Maternal Grandmother   . Kidney disease Maternal Grandmother   . Heart failure Paternal Grandfather     ROS Per hpi  OBJECTIVE:  Today's Vitals   03/20/19 1512  BP: (!) 141/100  Pulse: 94  Temp: 98.1 F (36.7 C)  SpO2: 97%  Weight: 252 lb 6.4 oz (114.5 kg)  Height: 5\' 6"  (1.676 m)   Body mass index is 40.74 kg/m.  BP Readings from  Last 3 Encounters:  03/20/19 (!) 141/100  11/06/18 137/90  09/12/18 (!) 159/107   Wt Readings from Last 3 Encounters:  03/20/19 252 lb 6.4 oz (114.5 kg)  09/12/18 228 lb (103.4 kg)  03/28/18 245 lb 11.2 oz (111.4 kg)    Physical Exam Vitals signs and nursing note reviewed.  Constitutional:      Appearance: She is well-developed.  HENT:     Head: Normocephalic and atraumatic.     Mouth/Throat:     Pharynx: No oropharyngeal exudate.  Eyes:     General: No scleral icterus.    Conjunctiva/sclera: Conjunctivae normal.     Pupils: Pupils are equal, round, and reactive to light.  Neck:     Musculoskeletal: Neck supple.  Cardiovascular:     Rate and Rhythm: Normal rate and regular rhythm.     Heart sounds: Normal heart sounds. No murmur. No friction rub. No gallop.   Pulmonary:     Effort: Pulmonary effort is normal.     Breath sounds: Normal breath sounds. No wheezing, rhonchi or rales.  Abdominal:     General: Bowel sounds are normal. There is no distension.     Palpations: Abdomen is soft.     Tenderness: There is no abdominal tenderness.  Skin:    General: Skin is warm and dry.  Neurological:     Mental Status: She is alert and oriented to person, place, and time.     No results found for this or any previous visit (from the past 24 hour(s)).  No results found.   ASSESSMENT and PLAN  1. Gastroesophageal reflux disease without esophagitis Starting pepcid. Labs pending. Discussed LFM. - H. pylori breath test (ages 8+)  2. GAD (generalized anxiety disorder) Starting lexapro, discussed titration from 10mg  to 20mg . Reviewed r/se/b. - TSH  3. Essential hypertension, benign Starting amlodipine, reviewed r/se/b, discussed LFM, reviewed RTC precautions - CBC - Comprehensive metabolic panel  Other orders - famotidine (PEPCID) 20 MG tablet; Take 1 tablet (20 mg total) by mouth 2 (two) times daily. - amLODipine (NORVASC) 5 MG tablet; Take 1 tablet (5 mg total) by mouth  daily. - escitalopram (LEXAPRO) 20 MG tablet; Take 1 tablet (20 mg total) by mouth at bedtime.  Return in about 4 weeks (around 04/17/2019).    Rutherford Guys, MD Primary Care at [place], [place] [address] Ph.  [phone] Fax [phone]

## 2019-03-21 LAB — CBC
Hematocrit: 43.9 % (ref 34.0–46.6)
Hemoglobin: 14.1 g/dL (ref 11.1–15.9)
MCH: 26.8 pg (ref 26.6–33.0)
MCHC: 32.1 g/dL (ref 31.5–35.7)
MCV: 83 fL (ref 79–97)
Platelets: 243 10*3/uL (ref 150–450)
RBC: 5.27 x10E6/uL (ref 3.77–5.28)
RDW: 13.7 % (ref 11.7–15.4)
WBC: 8.6 10*3/uL (ref 3.4–10.8)

## 2019-03-21 LAB — COMPREHENSIVE METABOLIC PANEL
ALT: 15 IU/L (ref 0–32)
AST: 16 IU/L (ref 0–40)
Albumin/Globulin Ratio: 1.6 (ref 1.2–2.2)
Albumin: 4.2 g/dL (ref 3.8–4.8)
Alkaline Phosphatase: 106 IU/L (ref 39–117)
BUN/Creatinine Ratio: 15 (ref 9–23)
BUN: 11 mg/dL (ref 6–24)
Bilirubin Total: <0.2 mg/dL (ref 0.0–1.2)
CO2: 23 mmol/L (ref 20–29)
Calcium: 9.5 mg/dL (ref 8.7–10.2)
Chloride: 103 mmol/L (ref 96–106)
Creatinine, Ser: 0.75 mg/dL (ref 0.57–1.00)
GFR calc Af Amer: 113 mL/min/{1.73_m2} (ref >59)
GFR calc non Af Amer: 98 mL/min/{1.73_m2} (ref >59)
Globulin, Total: 2.6 g/dL (ref 1.5–4.5)
Glucose: 95 mg/dL (ref 65–99)
Potassium: 3.7 mmol/L (ref 3.5–5.2)
Sodium: 142 mmol/L (ref 134–144)
Total Protein: 6.8 g/dL (ref 6.0–8.5)

## 2019-03-21 LAB — TSH: TSH: 1.56 u[IU]/mL (ref 0.450–4.500)

## 2019-03-22 LAB — H. PYLORI BREATH COLLECTION

## 2019-03-22 LAB — H. PYLORI BREATH TEST: H pylori Breath Test: NEGATIVE

## 2019-04-02 ENCOUNTER — Encounter: Payer: Self-pay | Admitting: Registered Nurse

## 2019-04-02 ENCOUNTER — Telehealth: Payer: Self-pay | Admitting: Family Medicine

## 2019-04-02 ENCOUNTER — Other Ambulatory Visit: Payer: Self-pay

## 2019-04-02 ENCOUNTER — Telehealth (INDEPENDENT_AMBULATORY_CARE_PROVIDER_SITE_OTHER): Payer: BC Managed Care – PPO | Admitting: Registered Nurse

## 2019-04-02 VITALS — Temp 98.2°F | Ht 66.0 in | Wt 245.0 lb

## 2019-04-02 DIAGNOSIS — R05 Cough: Secondary | ICD-10-CM

## 2019-04-02 DIAGNOSIS — R6889 Other general symptoms and signs: Secondary | ICD-10-CM

## 2019-04-02 DIAGNOSIS — R059 Cough, unspecified: Secondary | ICD-10-CM

## 2019-04-02 MED ORDER — BALOXAVIR MARBOXIL(80 MG DOSE) 2 X 40 MG PO TBPK: 80.0000 mg | ORAL_TABLET | Freq: Once | ORAL | 0 refills | Status: AC

## 2019-04-02 MED ORDER — PROMETHAZINE HCL 6.25 MG/5ML PO SYRP: 6.2500 mg | ORAL_SOLUTION | Freq: Four times a day (QID) | ORAL | 1 refills | Status: DC | PRN

## 2019-04-02 MED ORDER — BENZONATATE 200 MG PO CAPS: 200.0000 mg | ORAL_CAPSULE | Freq: Two times a day (BID) | ORAL | 0 refills | Status: DC | PRN

## 2019-04-02 NOTE — Progress Notes (Signed)
Vertigo and chest tightness around 11:30 today  ZJ:IRCVE on 3 hours  Dry mouth & sore throat Weakness & Dizziness Chest congestion & Cough  Been around someone with flu

## 2019-04-02 NOTE — Progress Notes (Signed)
Telemedicine Encounter- SOAP NOTE Established Patient  This telephone encounter was conducted with the patient's (or proxy's) verbal consent via audio telecommunications: 12 minutes  Patient was instructed to have this encounter in a suitably private space; and to only have persons present to whom they give permission to participate. In addition, patient identity was confirmed by use of name plus two identifiers (DOB and address).  I discussed the limitations, risks, security and privacy concerns of performing an evaluation and management service by telephone and the availability of in person appointments. I also discussed with the patient that there may be a patient responsible charge related to this service. The patient expressed understanding and agreed to proceed.  I spent a total of 12 minutes  talking with the patient or their proxy.  No chief complaint on file.   Subjective   Laqueshia Cihlar is a 43 y.o. established patient. Telephone visit today for flu like symptoms  HPI Onset around 11:30 this morning. Worsening over time. Having dry mouth, sore throat, weakness, mild dizziness, coughing. Known sick contact: two people at her work, including the coworker she works most closely with, have had the seasonal flu in the past two weeks.  No sensory changes, headache, NVD, dependent edema, chest pain.  Patient Active Problem List   Diagnosis Date Noted  . Elevated blood pressure reading 09/12/2018  . Mild intermittent asthma without complication 21/30/8657  . Anemia 05/02/2018  . Neck pain 06/21/2017  . Anterior pleuritic pain 06/21/2017  . Iron deficiency anemia due to chronic blood loss 02/28/2017  . Situational anxiety 11/17/2016  . Menorrhagia with regular cycle 08/11/2016  . Anemia due to chronic blood loss 08/11/2016  . NEPHROLITHIASIS, HX OF 02/15/2010  . ALLERGIC RHINITIS 02/11/2010  . Asthma 02/11/2010    Past Medical History:  Diagnosis Date  . Allergy   .  Anemia   . Anemia   . Anxiety   . Asthma   . Chronic headache   . Depression   . Hypertension   . IBS (irritable bowel syndrome)   . Kidney stones   . Nephrolithiasis   . Obesity   . Seizures ([place])     Current Outpatient Medications  Medication Sig Dispense Refill  . albuterol (PROVENTIL HFA;VENTOLIN HFA) 108 (90 Base) MCG/ACT inhaler Inhale 2 puffs into the lungs every 6 (six) hours as needed for wheezing or shortness of breath. 1 Inhaler 2  . amLODipine (NORVASC) 5 MG tablet Take 1 tablet (5 mg total) by mouth daily. 30 tablet 3  . EPINEPHrine (EPIPEN) 0.3 mg/0.3 mL SOAJ injection Inject 0.3 mLs (0.3 mg total) into the muscle once. 2 Device 2  . escitalopram (LEXAPRO) 20 MG tablet Take 1 tablet (20 mg total) by mouth at bedtime. 30 tablet 2  . fluticasone (FLONASE) 50 MCG/ACT nasal spray Place 2 sprays into both nostrils daily. 16 g 6  . Fluticasone Furoate-Vilanterol (BREO ELLIPTA) 100-25 MCG/INH AEPB Inhale into the lungs.    Marland Kitchen ibuprofen (ADVIL,MOTRIN) 200 MG tablet Take 200 mg by mouth every 6 (six) hours as needed.    Marland Kitchen LORazepam (ATIVAN) 0.5 MG tablet Take 1/2-1 tab PO BID prn 30 tablet 0  . Baloxavir Marboxil,80 MG Dose, 2 x 40 MG TBPK Take 80 mg by mouth once for 1 dose. 2 each 0  . benzonatate (TESSALON) 200 MG capsule Take 1 capsule (200 mg total) by mouth 2 (two) times daily as needed for cough. 20 capsule 0  . famotidine (PEPCID) 20 MG  tablet Take 1 tablet (20 mg total) by mouth 2 (two) times daily. (Patient not taking: Reported on 04/02/2019) 60 tablet 2  . promethazine (PHENERGAN) 6.25 MG/5ML syrup Take 5 mLs (6.25 mg total) by mouth every 6 (six) hours as needed for nausea or vomiting. 120 mL 1   Current Facility-Administered Medications  Medication Dose Route Frequency Provider Last Rate Last Admin  . sodium chloride 0.9 % bolus 1,000 mL  1,000 mL Intravenous Once Doristine BosworthStallings, Zoe A, MD        Allergies  Allergen Reactions  . Amoxicillin     Hives with first dose    . Codeine     REACTION: itching    Social History   Socioeconomic History  . Marital status: Married    Spouse name: Not on file  . Number of children: Not on file  . Years of education: Not on file  . Highest education level: Not on file  Occupational History  . Not on file  Tobacco Use  . Smoking status: Never Smoker  . Smokeless tobacco: Never Used  Substance and Sexual Activity  . Alcohol use: No    Alcohol/week: 0.0 standard drinks  . Drug use: No  . Sexual activity: Never  Other Topics Concern  . Not on file  Social History Narrative  . Not on file   Social Determinants of Health   Financial Resource Strain:   . Difficulty of Paying Living Expenses: Not on file  Food Insecurity:   . Worried About Programme researcher, broadcasting/film/videounning Out of Food in the Last Year: Not on file  . Ran Out of Food in the Last Year: Not on file  Transportation Needs:   . Lack of Transportation (Medical): Not on file  . Lack of Transportation (Non-Medical): Not on file  Physical Activity:   . Days of Exercise per Week: Not on file  . Minutes of Exercise per Session: Not on file  Stress:   . Feeling of Stress : Not on file  Social Connections:   . Frequency of Communication with Friends and Family: Not on file  . Frequency of Social Gatherings with Friends and Family: Not on file  . Attends Religious Services: Not on file  . Active Member of Clubs or Organizations: Not on file  . Attends BankerClub or Organization Meetings: Not on file  . Marital Status: Not on file  Intimate Partner Violence:   . Fear of Current or Ex-Partner: Not on file  . Emotionally Abused: Not on file  . Physically Abused: Not on file  . Sexually Abused: Not on file    ROS Per hpi   Objective   Vitals as reported by the patient: Today's Vitals   04/02/19 1414  Temp: 98.2 F (36.8 C)  TempSrc: Temporal  Weight: 245 lb (111.1 kg)  Height: 5\' 6"  (1.676 m)    Diagnoses and all orders for this visit:  Cough -     benzonatate  (TESSALON) 200 MG capsule; Take 1 capsule (200 mg total) by mouth 2 (two) times daily as needed for cough. -     promethazine (PHENERGAN) 6.25 MG/5ML syrup; Take 5 mLs (6.25 mg total) by mouth every 6 (six) hours as needed for nausea or vomiting.  Flu-like symptoms -     Baloxavir Marboxil,80 MG Dose, 2 x 40 MG TBPK; Take 80 mg by mouth once for 1 dose.   PLAN  Based on symptoms and known exposure, likely seasonal flu rather than COVID.   Discussed supportive  care.  Given recent onset, optimistic that antivirals will be beneficial.  Given cough suppressants.  Will supply with COVID testing resources should COVID symptoms present.  Patient encouraged to call clinic with any questions, comments, or concerns.  I discussed the assessment and treatment plan with the patient. The patient was provided an opportunity to ask questions and all were answered. The patient agreed with the plan and demonstrated an understanding of the instructions.   The patient was advised to call back or seek an in-person evaluation if the symptoms worsen or if the condition fails to improve as anticipated.  I provided 12 minutes of non-face-to-face time during this encounter.  Janeece Agee, NP  Primary Care at [facility]

## 2019-04-02 NOTE — Telephone Encounter (Signed)
Pt stating that she is waiting on medications that were prescribed from recent telemed appt . She does not live in [place], and only works here   Please advise asap

## 2019-04-02 NOTE — Patient Instructions (Signed)
° ° ° °  If you have lab work done today you will be contacted with your lab results within the next 2 weeks.  If you have not heard from us then please contact us. The fastest way to get your results is to register for My Chart. ° ° °IF you received an x-ray today, you will receive an invoice from [facility]. Please contact [facility] at [phone] with questions or concerns regarding your invoice.  ° °IF you received labwork today, you will receive an invoice from [facility]. Please contact [facility] at [phone] with questions or concerns regarding your invoice.  ° °Our billing staff will not be able to assist you with questions regarding bills from these companies. ° °You will be contacted with the lab results as soon as they are available. The fastest way to get your results is to activate your My Chart account. Instructions are located on the last page of this paperwork. If you have not heard from us regarding the results in 2 weeks, please contact this office. °  ° ° ° °

## 2019-04-04 NOTE — Telephone Encounter (Signed)
Patient stated she have picked up meds

## 2019-04-17 ENCOUNTER — Other Ambulatory Visit: Payer: Self-pay

## 2019-04-17 ENCOUNTER — Ambulatory Visit: Payer: BC Managed Care – PPO | Admitting: Family Medicine

## 2019-04-17 ENCOUNTER — Encounter: Payer: Self-pay | Admitting: Family Medicine

## 2019-04-17 VITALS — BP 133/85 | HR 86 | Temp 98.0°F | Ht 66.0 in | Wt 250.4 lb

## 2019-04-17 DIAGNOSIS — J4530 Mild persistent asthma, uncomplicated: Secondary | ICD-10-CM | POA: Diagnosis not present

## 2019-04-17 DIAGNOSIS — Z23 Encounter for immunization: Secondary | ICD-10-CM

## 2019-04-17 DIAGNOSIS — I10 Essential (primary) hypertension: Secondary | ICD-10-CM | POA: Diagnosis not present

## 2019-04-17 DIAGNOSIS — F411 Generalized anxiety disorder: Secondary | ICD-10-CM | POA: Diagnosis not present

## 2019-04-17 MED ORDER — AMLODIPINE BESYLATE 5 MG PO TABS: 5.0000 mg | ORAL_TABLET | Freq: Every day | ORAL | 1 refills | Status: DC

## 2019-04-17 MED ORDER — ALBUTEROL SULFATE HFA 108 (90 BASE) MCG/ACT IN AERS: 2.0000 | INHALATION_SPRAY | Freq: Four times a day (QID) | RESPIRATORY_TRACT | 2 refills | Status: AC | PRN

## 2019-04-17 MED ORDER — ESCITALOPRAM OXALATE 5 MG PO TABS: 5.0000 mg | ORAL_TABLET | Freq: Every day | ORAL | 2 refills | Status: DC

## 2019-04-17 NOTE — Progress Notes (Signed)
1/5/20213:37 PM  Sylvia Cantrell June 12, 1975, 44 y.o., female [id number]  Chief Complaint  Patient presents with  . Hypertension    monitors bp at home says numbers have been great. Says she has not had to use pepcid at all  . Anxiety    concerned about dosage of this meds, only taking half the pill. thinks she meds may be causing her to be a little too passive with things she should be paying close attn to    HPI:   Patient is a 44 y.o. female with past medical history significant for GERD, HTN, GAD who presents today for followup  Last OV a month ago Started pepcid, amlodipine and lexapro  She is overall doing well and has no acute concerns today  Tolerating amlodipine well She is monitoring at home and similar to today  Her gerd completely resolved once her anxiety resolved Anxiety doing really well on lexapro 10mg  Gad 7= 2 However being too blunted, not having appropriate response to events such as not getting a new washing machine, when hers died 3 weeks ago  She is also requesting refill of albuterol Usually sees asthma and allergy Asthma well controlled Triggers winter and spring  Requesting flu vaccine  Depression screen [facility] 2/9 04/17/2019 04/02/2019 03/20/2019  Decreased Interest 0 0 0  Down, Depressed, Hopeless 0 0 0  PHQ - 2 Score 0 0 0  Altered sleeping - - -  Tired, decreased energy - - -  Change in appetite - - -  Feeling bad or failure about yourself  - - -  Trouble concentrating - - -  Moving slowly or fidgety/restless - - -  Suicidal thoughts - - -  PHQ-9 Score - - -  Difficult doing work/chores - - -    Fall Risk  04/17/2019 04/02/2019 03/20/2019 09/12/2018 10/27/2017  Falls in the past year? 0 0 0 0 No  Number falls in past yr: 0 0 0 0 -  Injury with Fall? - 0 0 0 -  Follow up - Falls evaluation completed - - -     Allergies  Allergen Reactions  . Amoxicillin     Hives with first dose  . Codeine     REACTION: itching    Prior to Admission  medications   Medication Sig Start Date End Date Taking? Authorizing Provider  albuterol (PROVENTIL HFA;VENTOLIN HFA) 108 (90 Base) MCG/ACT inhaler Inhale 2 puffs into the lungs every 6 (six) hours as needed for wheezing or shortness of breath. 05/06/15  Yes Elby Beck, FNP  amLODipine (NORVASC) 5 MG tablet Take 1 tablet (5 mg total) by mouth daily. 03/20/19  Yes Rutherford Guys, MD  EPINEPHrine (EPIPEN) 0.3 mg/0.3 mL SOAJ injection Inject 0.3 mLs (0.3 mg total) into the muscle once. 03/12/13  Yes Posey Boyer, MD  escitalopram (LEXAPRO) 20 MG tablet Take 1 tablet (20 mg total) by mouth at bedtime. 03/20/19  Yes Rutherford Guys, MD  fluticasone [facility]) 50 MCG/ACT nasal spray Place 2 sprays into both nostrils daily. 10/27/17  Yes Stallings, Zoe A, MD  Fluticasone Furoate-Vilanterol (BREO ELLIPTA) 100-25 MCG/INH AEPB Inhale into the lungs.   Yes [provider]  ibuprofen (ADVIL,MOTRIN) 200 MG tablet Take 200 mg by mouth every 6 (six) hours as needed.   Yes [provider]  LORazepam (ATIVAN) 0.5 MG tablet Take 1/2-1 tab PO BID prn 09/12/18  Yes Corum, Rex Kras, MD    Past Medical History:  Diagnosis Date  .  Allergy   . Anemia   . Anemia   . Anxiety   . Asthma   . Chronic headache   . Depression   . Hypertension   . IBS (irritable bowel syndrome)   . Kidney stones   . Nephrolithiasis   . Obesity   . Seizures (HCC)     Past Surgical History:  Procedure Laterality Date  . ABLATION    . BREAST SURGERY    . TONSILLECTOMY  1987    Social History   Tobacco Use  . Smoking status: Never Smoker  . Smokeless tobacco: Never Used  Substance Use Topics  . Alcohol use: No    Alcohol/week: 0.0 standard drinks    Family History  Problem Relation Age of Onset  . Hypertension Mother   . Hyperthyroidism Mother   . Heart failure Father   . Heart failure Maternal Grandmother   . Hypertension Maternal Grandmother   . Kidney disease Maternal Grandmother   . Heart  failure Paternal Grandfather     ROS Per hpi  OBJECTIVE:  Today's Vitals   04/17/19 1518  BP: 133/85  Pulse: 86  Temp: 98 F (36.7 C)  SpO2: 97%  Weight: 250 lb 6.4 oz (113.6 kg)  Height: 5\' 6"  (1.676 m)   Body mass index is 40.42 kg/m.   Physical Exam Vitals and nursing note reviewed.  Constitutional:      Appearance: She is well-developed.  HENT:     Head: Normocephalic and atraumatic.  Eyes:     General: No scleral icterus.    Conjunctiva/sclera: Conjunctivae normal.     Pupils: Pupils are equal, round, and reactive to light.  Pulmonary:     Effort: Pulmonary effort is normal.  Musculoskeletal:     Cervical back: Neck supple.  Skin:    General: Skin is warm and dry.  Neurological:     Mental Status: She is alert and oriented to person, place, and time.     No results found for this or any previous visit (from the past 24 hour(s)).  No results found.   ASSESSMENT and PLAN  1. Essential hypertension, benign Controlled. Continue current regime.   2. GAD (generalized anxiety disorder) Controlled but with minor side effects of medications. Will try lower dose. Reassess at next OV  3. Mild persistent asthma without complication Stable. Managed by allergist.  - albuterol (VENTOLIN HFA) 108 (90 Base) MCG/ACT inhaler; Inhale 2 puffs into the lungs every 6 (six) hours as needed for wheezing or shortness of breath.  Other orders - amLODipine (NORVASC) 5 MG tablet; Take 1 tablet (5 mg total) by mouth daily. - escitalopram (LEXAPRO) 5 MG tablet; Take 1 tablet (5 mg total) by mouth daily. - Influenza (Seasonal)  Return in about 4 weeks (around 05/15/2019).    07/13/2019, MD Primary Care at [facility] [address] [place], [place] Ph.  [phone] Fax [phone]

## 2019-04-17 NOTE — Patient Instructions (Signed)
° ° ° °  If you have lab work done today you will be contacted with your lab results within the next 2 weeks.  If you have not heard from us then please contact us. The fastest way to get your results is to register for My Chart. ° ° °IF you received an x-ray today, you will receive an invoice from [facility]. Please contact [facility] at [phone] with questions or concerns regarding your invoice.  ° °IF you received labwork today, you will receive an invoice from [facility]. Please contact [facility] at [phone] with questions or concerns regarding your invoice.  ° °Our billing staff will not be able to assist you with questions regarding bills from these companies. ° °You will be contacted with the lab results as soon as they are available. The fastest way to get your results is to activate your My Chart account. Instructions are located on the last page of this paperwork. If you have not heard from us regarding the results in 2 weeks, please contact this office. °  ° ° ° °

## 2019-04-25 ENCOUNTER — Inpatient Hospital Stay (HOSPITAL_COMMUNITY)
Admission: EM | Admit: 2019-04-25 | Discharge: 2019-04-28 | DRG: 871 | Disposition: A | Discharge status: Home / Self Care | Payer: BC Managed Care – PPO | Attending: Family Medicine | Admitting: Family Medicine

## 2019-04-25 ENCOUNTER — Emergency Department: Payer: BC Managed Care – PPO

## 2019-04-25 ENCOUNTER — Emergency Department
Admission: EM | Admit: 2019-04-25 | Discharge: 2019-04-25 | Disposition: A | Discharge status: Home / Self Care | Payer: BC Managed Care – PPO | Attending: Emergency Medicine | Admitting: Emergency Medicine

## 2019-04-25 ENCOUNTER — Other Ambulatory Visit: Payer: Self-pay

## 2019-04-25 ENCOUNTER — Emergency Department (HOSPITAL_COMMUNITY): Payer: BC Managed Care – PPO

## 2019-04-25 ENCOUNTER — Encounter (HOSPITAL_COMMUNITY): Payer: Self-pay | Admitting: *Deleted

## 2019-04-25 ENCOUNTER — Encounter: Payer: Self-pay | Admitting: Emergency Medicine

## 2019-04-25 DIAGNOSIS — E559 Vitamin D deficiency, unspecified: Secondary | ICD-10-CM | POA: Diagnosis present

## 2019-04-25 DIAGNOSIS — Z87442 Personal history of urinary calculi: Secondary | ICD-10-CM

## 2019-04-25 DIAGNOSIS — D696 Thrombocytopenia, unspecified: Secondary | ICD-10-CM | POA: Diagnosis present

## 2019-04-25 DIAGNOSIS — I959 Hypotension, unspecified: Secondary | ICD-10-CM | POA: Diagnosis not present

## 2019-04-25 DIAGNOSIS — E669 Obesity, unspecified: Secondary | ICD-10-CM | POA: Diagnosis present

## 2019-04-25 DIAGNOSIS — I10 Essential (primary) hypertension: Secondary | ICD-10-CM | POA: Diagnosis not present

## 2019-04-25 DIAGNOSIS — K8689 Other specified diseases of pancreas: Secondary | ICD-10-CM | POA: Diagnosis present

## 2019-04-25 DIAGNOSIS — Z79899 Other long term (current) drug therapy: Secondary | ICD-10-CM | POA: Diagnosis not present

## 2019-04-25 DIAGNOSIS — Z885 Allergy status to narcotic agent status: Secondary | ICD-10-CM

## 2019-04-25 DIAGNOSIS — R519 Headache, unspecified: Secondary | ICD-10-CM | POA: Diagnosis not present

## 2019-04-25 DIAGNOSIS — E876 Hypokalemia: Secondary | ICD-10-CM | POA: Diagnosis present

## 2019-04-25 DIAGNOSIS — R569 Unspecified convulsions: Secondary | ICD-10-CM | POA: Diagnosis present

## 2019-04-25 DIAGNOSIS — K589 Irritable bowel syndrome without diarrhea: Secondary | ICD-10-CM | POA: Diagnosis not present

## 2019-04-25 DIAGNOSIS — Z20822 Contact with and (suspected) exposure to covid-19: Secondary | ICD-10-CM | POA: Insufficient documentation

## 2019-04-25 DIAGNOSIS — R823 Hemoglobinuria: Secondary | ICD-10-CM | POA: Diagnosis present

## 2019-04-25 DIAGNOSIS — Z8371 Family history of colonic polyps: Secondary | ICD-10-CM

## 2019-04-25 DIAGNOSIS — F419 Anxiety disorder, unspecified: Secondary | ICD-10-CM | POA: Diagnosis not present

## 2019-04-25 DIAGNOSIS — N3090 Cystitis, unspecified without hematuria: Secondary | ICD-10-CM | POA: Diagnosis not present

## 2019-04-25 DIAGNOSIS — R000 Tachycardia, unspecified: Secondary | ICD-10-CM | POA: Diagnosis present

## 2019-04-25 DIAGNOSIS — K862 Cyst of pancreas: Secondary | ICD-10-CM | POA: Diagnosis not present

## 2019-04-25 DIAGNOSIS — J45909 Unspecified asthma, uncomplicated: Secondary | ICD-10-CM | POA: Insufficient documentation

## 2019-04-25 DIAGNOSIS — J189 Pneumonia, unspecified organism: Secondary | ICD-10-CM | POA: Insufficient documentation

## 2019-04-25 DIAGNOSIS — F329 Major depressive disorder, single episode, unspecified: Secondary | ICD-10-CM | POA: Diagnosis not present

## 2019-04-25 DIAGNOSIS — Z6839 Body mass index (BMI) 39.0-39.9, adult: Secondary | ICD-10-CM | POA: Diagnosis not present

## 2019-04-25 DIAGNOSIS — Z8249 Family history of ischemic heart disease and other diseases of the circulatory system: Secondary | ICD-10-CM | POA: Diagnosis not present

## 2019-04-25 DIAGNOSIS — R1012 Left upper quadrant pain: Secondary | ICD-10-CM | POA: Diagnosis not present

## 2019-04-25 DIAGNOSIS — Z881 Allergy status to other antibiotic agents status: Secondary | ICD-10-CM

## 2019-04-25 DIAGNOSIS — A419 Sepsis, unspecified organism: Secondary | ICD-10-CM | POA: Diagnosis not present

## 2019-04-25 DIAGNOSIS — R1032 Left lower quadrant pain: Secondary | ICD-10-CM | POA: Diagnosis not present

## 2019-04-25 DIAGNOSIS — K5730 Diverticulosis of large intestine without perforation or abscess without bleeding: Secondary | ICD-10-CM | POA: Diagnosis not present

## 2019-04-25 DIAGNOSIS — R509 Fever, unspecified: Secondary | ICD-10-CM | POA: Diagnosis not present

## 2019-04-25 DIAGNOSIS — R0902 Hypoxemia: Secondary | ICD-10-CM | POA: Diagnosis not present

## 2019-04-25 DIAGNOSIS — R52 Pain, unspecified: Secondary | ICD-10-CM | POA: Diagnosis not present

## 2019-04-25 LAB — COMPREHENSIVE METABOLIC PANEL
ALT: 23 U/L (ref 0–44)
ALT: 24 U/L (ref 0–44)
AST: 22 U/L (ref 15–41)
AST: 25 U/L (ref 15–41)
Albumin: 4.2 g/dL (ref 3.5–5.0)
Albumin: 3.3 g/dL — ABNORMAL LOW (ref 3.5–5.0)
Alkaline Phosphatase: 83 U/L (ref 38–126)
Alkaline Phosphatase: 71 U/L (ref 38–126)
Anion gap: 11 (ref 5–15)
Anion gap: 9 (ref 5–15)
BUN: 17 mg/dL (ref 6–20)
BUN: 12 mg/dL (ref 6–20)
CO2: 22 mmol/L (ref 22–32)
CO2: 22 mmol/L (ref 22–32)
Calcium: 9.5 mg/dL (ref 8.9–10.3)
Calcium: 8.3 mg/dL — ABNORMAL LOW (ref 8.9–10.3)
Chloride: 107 mmol/L (ref 98–111)
Chloride: 111 mmol/L (ref 98–111)
Creatinine, Ser: 0.78 mg/dL (ref 0.44–1.00)
Creatinine, Ser: 1 mg/dL (ref 0.44–1.00)
GFR calc Af Amer: >60 mL/min (ref >60)
GFR calc Af Amer: >60 mL/min (ref >60)
GFR calc non Af Amer: >60 mL/min (ref >60)
GFR calc non Af Amer: >60 mL/min (ref >60)
Glucose, Bld: 133 mg/dL — ABNORMAL HIGH (ref 70–99)
Glucose, Bld: 113 mg/dL — ABNORMAL HIGH (ref 70–99)
Potassium: 3.5 mmol/L (ref 3.5–5.1)
Potassium: 3.1 mmol/L — ABNORMAL LOW (ref 3.5–5.1)
Sodium: 140 mmol/L (ref 135–145)
Sodium: 142 mmol/L (ref 135–145)
Total Bilirubin: 0.8 mg/dL (ref 0.3–1.2)
Total Bilirubin: 0.8 mg/dL (ref 0.3–1.2)
Total Protein: 7.8 g/dL (ref 6.5–8.1)
Total Protein: 6 g/dL — ABNORMAL LOW (ref 6.5–8.1)

## 2019-04-25 LAB — URINALYSIS, COMPLETE (UACMP) WITH MICROSCOPIC
Bacteria, UA: NONE SEEN
Bilirubin Urine: NEGATIVE
Glucose, UA: NEGATIVE mg/dL
Ketones, ur: NEGATIVE mg/dL
Leukocytes,Ua: NEGATIVE
Nitrite: NEGATIVE
Protein, ur: NEGATIVE mg/dL
Specific Gravity, Urine: 1.019 (ref 1.005–1.030)
pH: 7 (ref 5.0–8.0)

## 2019-04-25 LAB — CBC WITH DIFFERENTIAL/PLATELET
Abs Immature Granulocytes: 0.02 10*3/uL (ref 0.00–0.07)
Basophils Absolute: 0 10*3/uL (ref 0.0–0.1)
Basophils Relative: 0 %
Eosinophils Absolute: 0 10*3/uL (ref 0.0–0.5)
Eosinophils Relative: 0 %
HCT: 40.5 % (ref 36.0–46.0)
Hemoglobin: 12.8 g/dL (ref 12.0–15.0)
Immature Granulocytes: 0 %
Lymphocytes Relative: 21 %
Lymphs Abs: 1 10*3/uL (ref 0.7–4.0)
MCH: 27.6 pg (ref 26.0–34.0)
MCHC: 31.6 g/dL (ref 30.0–36.0)
MCV: 87.3 fL (ref 80.0–100.0)
Monocytes Absolute: 0 10*3/uL — ABNORMAL LOW (ref 0.1–1.0)
Monocytes Relative: 1 %
Neutro Abs: 3.8 10*3/uL (ref 1.7–7.7)
Neutrophils Relative %: 78 %
Platelets: 151 10*3/uL (ref 150–400)
RBC: 4.64 MIL/uL (ref 3.87–5.11)
RDW: 14.6 % (ref 11.5–15.5)
WBC: 5 10*3/uL (ref 4.0–10.5)
nRBC: 0 % (ref 0.0–0.2)

## 2019-04-25 LAB — LACTIC ACID, PLASMA
Lactic Acid, Venous: 2.1 mmol/L (ref 0.5–1.9)
Lactic Acid, Venous: 1.4 mmol/L (ref 0.5–1.9)
Lactic Acid, Venous: 1.8 mmol/L (ref 0.5–1.9)

## 2019-04-25 LAB — CBC
HCT: 45 % (ref 36.0–46.0)
Hemoglobin: 14.4 g/dL (ref 12.0–15.0)
MCH: 26.6 pg (ref 26.0–34.0)
MCHC: 32 g/dL (ref 30.0–36.0)
MCV: 83.2 fL (ref 80.0–100.0)
Platelets: 229 10*3/uL (ref 150–400)
RBC: 5.41 MIL/uL — ABNORMAL HIGH (ref 3.87–5.11)
RDW: 14.3 % (ref 11.5–15.5)
WBC: 15.8 10*3/uL — ABNORMAL HIGH (ref 4.0–10.5)
nRBC: 0 % (ref 0.0–0.2)

## 2019-04-25 LAB — RESPIRATORY PANEL BY RT PCR (FLU A&B, COVID)
Influenza A by PCR: NEGATIVE
Influenza B by PCR: NEGATIVE
SARS Coronavirus 2 by RT PCR: NEGATIVE

## 2019-04-25 LAB — PROTIME-INR
INR: 1.1 (ref 0.8–1.2)
Prothrombin Time: 13.6 seconds (ref 11.4–15.2)

## 2019-04-25 LAB — APTT: aPTT: 29 seconds (ref 24–36)

## 2019-04-25 LAB — LIPASE, BLOOD
Lipase: 40 U/L (ref 11–51)
Lipase: 34 U/L (ref 11–51)

## 2019-04-25 LAB — POCT PREGNANCY, URINE: Preg Test, Ur: NEGATIVE

## 2019-04-25 LAB — POC SARS CORONAVIRUS 2 AG: SARS Coronavirus 2 Ag: NEGATIVE

## 2019-04-25 MED ORDER — SODIUM CHLORIDE 0.9 % IV SOLN: Freq: Once | INTRAVENOUS | Status: AC

## 2019-04-25 MED ORDER — LACTATED RINGERS IV BOLUS (SEPSIS): 1000.0000 mL | Freq: Once | INTRAVENOUS | Status: DC

## 2019-04-25 MED ORDER — MORPHINE SULFATE (PF) 4 MG/ML IV SOLN
4.0000 mg | Freq: Once | INTRAVENOUS | Status: AC
  Administered 2019-04-25: 4 mg via INTRAVENOUS
  Filled 2019-04-25: qty 1

## 2019-04-25 MED ORDER — VANCOMYCIN HCL IN DEXTROSE 1-5 GM/200ML-% IV SOLN
1000.0000 mg | Freq: Two times a day (BID) | INTRAVENOUS | Status: DC
  Administered 2019-04-26 – 2019-04-27 (×3): 1000 mg via INTRAVENOUS
  Filled 2019-04-25 (×5): qty 200

## 2019-04-25 MED ORDER — SODIUM CHLORIDE 0.9% FLUSH: 3.0000 mL | Freq: Once | INTRAVENOUS | Status: DC

## 2019-04-25 MED ORDER — SODIUM CHLORIDE 0.9 % IV BOLUS
1000.0000 mL | Freq: Once | INTRAVENOUS | Status: AC
  Administered 2019-04-25: 22:00:00 1000 mL via INTRAVENOUS

## 2019-04-25 MED ORDER — VANCOMYCIN HCL 10 G IV SOLR
2500.0000 mg | Freq: Once | INTRAVENOUS | Status: AC
  Administered 2019-04-25: 23:00:00 2500 mg via INTRAVENOUS
  Filled 2019-04-25: qty 2500

## 2019-04-25 MED ORDER — ACETAMINOPHEN 325 MG PO TABS
650.0000 mg | ORAL_TABLET | Freq: Once | ORAL | Status: AC
  Administered 2019-04-25: 650 mg via ORAL
  Filled 2019-04-25: qty 2

## 2019-04-25 MED ORDER — SODIUM CHLORIDE 0.9 % IV SOLN
2.0000 g | Freq: Three times a day (TID) | INTRAVENOUS | Status: DC
  Administered 2019-04-25 – 2019-04-28 (×8): 2 g via INTRAVENOUS
  Filled 2019-04-25 (×10): qty 2

## 2019-04-25 MED ORDER — ACETAMINOPHEN 500 MG PO TABS
1000.0000 mg | ORAL_TABLET | Freq: Once | ORAL | Status: AC
  Administered 2019-04-25: 1000 mg via ORAL
  Filled 2019-04-25: qty 2

## 2019-04-25 MED ORDER — METRONIDAZOLE IN NACL 5-0.79 MG/ML-% IV SOLN
500.0000 mg | Freq: Once | INTRAVENOUS | Status: AC
  Administered 2019-04-25: 22:00:00 500 mg via INTRAVENOUS
  Filled 2019-04-25: qty 100

## 2019-04-25 MED ORDER — VANCOMYCIN HCL IN DEXTROSE 1-5 GM/200ML-% IV SOLN: 1000.0000 mg | Freq: Once | INTRAVENOUS | Status: DC

## 2019-04-25 MED ORDER — IOHEXOL 300 MG/ML  SOLN
100.0000 mL | Freq: Once | INTRAMUSCULAR | Status: AC | PRN
  Administered 2019-04-25: 100 mL via INTRAVENOUS

## 2019-04-25 MED ORDER — CEPHALEXIN 500 MG PO CAPS: 500.0000 mg | ORAL_CAPSULE | Freq: Two times a day (BID) | ORAL | 0 refills | Status: DC

## 2019-04-25 MED ORDER — ONDANSETRON HCL 4 MG/2ML IJ SOLN
4.0000 mg | Freq: Once | INTRAMUSCULAR | Status: AC
  Administered 2019-04-25: 23:00:00 4 mg via INTRAVENOUS
  Filled 2019-04-25: qty 2

## 2019-04-25 MED ORDER — ONDANSETRON HCL 4 MG/2ML IJ SOLN
4.0000 mg | Freq: Once | INTRAMUSCULAR | Status: AC
  Administered 2019-04-25: 4 mg via INTRAVENOUS
  Filled 2019-04-25: qty 2

## 2019-04-25 MED ORDER — SODIUM CHLORIDE 0.9 % IV SOLN: 2.0000 g | Freq: Once | INTRAVENOUS | Status: DC

## 2019-04-25 NOTE — ED Provider Notes (Signed)
[facility] Emergency Department Provider Note   ____________________________________________    I have reviewed the triage vital signs and the nursing notes.   HISTORY  Chief Complaint Abdominal Pain     HPI Sylvia Cantrell is a 44 y.o. female with a history as noted below who presents with complaints of left flank pain, left lower quadrant pain which started in the last several days and is worsened.  She does report chills possible fevers.  She has had urinary tract infections before and kidney stones but this does not quite feel the same.  Has not take anything for this.  Some urinary frequency.  No history of abdominal surgery, normal stools reported.  Past Medical History:  Diagnosis Date  . Allergy   . Anemia   . Anxiety   . Asthma   . Chronic headache   . Depression   . Hypertension   . IBS (irritable bowel syndrome)   . Kidney stones   . Nephrolithiasis   . Obesity   . Seizures [facility])     Patient Active Problem List   Diagnosis Date Noted  . Sepsis due to undetermined organism (HCC) 04/26/2019  . Hypophosphatemia 04/26/2019  . Hypokalemia 04/26/2019  . Pancreatic mass 04/26/2019  . Elevated blood pressure reading 09/12/2018  . Mild intermittent asthma without complication 09/12/2018  . Anemia 05/02/2018  . Neck pain 06/21/2017  . Anterior pleuritic pain 06/21/2017  . Iron deficiency anemia due to chronic blood loss 02/28/2017  . Situational anxiety 11/17/2016  . Menorrhagia with regular cycle 08/11/2016  . Anemia due to chronic blood loss 08/11/2016  . NEPHROLITHIASIS, HX OF 02/15/2010  . ALLERGIC RHINITIS 02/11/2010  . Asthma 02/11/2010    Past Surgical History:  Procedure Laterality Date  . ABLATION    . BREAST SURGERY    . TONSILLECTOMY  1987    Prior to Admission medications   Medication Sig Start Date End Date Taking? Authorizing Provider  albuterol (VENTOLIN HFA) 108 (90 Base) MCG/ACT inhaler Inhale 2 puffs  into the lungs every 6 (six) hours as needed for wheezing or shortness of breath. 04/17/19   Myles Lipps, MD  amLODipine (NORVASC) 5 MG tablet Take 1 tablet (5 mg total) by mouth daily. 04/17/19   Myles Lipps, MD  cephALEXin (KEFLEX) 500 MG capsule Take 1 capsule (500 mg total) by mouth 2 (two) times daily. 04/25/19   Sharman Cheek, MD  EPINEPHrine (EPIPEN) 0.3 mg/0.3 mL SOAJ injection Inject 0.3 mLs (0.3 mg total) into the muscle once. 03/12/13   Peyton Najjar, MD  escitalopram (LEXAPRO) 5 MG tablet Take 1 tablet (5 mg total) by mouth daily. 04/17/19   Myles Lipps, MD  fluticasone (FLONASE) 50 MCG/ACT nasal spray Place 2 sprays into both nostrils daily. Patient taking differently: Place 2 sprays into both nostrils daily as needed for allergies.  10/27/17   Doristine Bosworth, MD  Fluticasone Furoate-Vilanterol (BREO ELLIPTA) 100-25 MCG/INH AEPB Inhale 1 puff into the lungs daily.     [provider]  ibuprofen (ADVIL,MOTRIN) 200 MG tablet Take 200 mg by mouth every 6 (six) hours as needed.    [provider]  LORazepam (ATIVAN) 0.5 MG tablet Take 1/2-1 tab PO BID prn Patient taking differently: Take 0.25 mg by mouth 2 (two) times daily as needed for anxiety. Take 1/2-1 tab PO BID prn 09/12/18   Wandra Feinstein, MD     Allergies Amoxicillin and Codeine  Family History  Problem  Relation Age of Onset  . Hypertension Mother   . Hyperthyroidism Mother   . Heart failure Father   . Heart failure Maternal Grandmother   . Hypertension Maternal Grandmother   . Kidney disease Maternal Grandmother   . Heart failure Paternal Grandfather     Social History Social History   Tobacco Use  . Smoking status: Never Smoker  . Smokeless tobacco: Never Used  Substance Use Topics  . Alcohol use: No    Alcohol/week: 0.0 standard drinks  . Drug use: No    Review of Systems  Constitutional: As above Eyes: No visual changes.  ENT: No sore throat. Cardiovascular: Denies chest  pain. Respiratory: Denies shortness of breath. Gastrointestinal: As above Genitourinary: As above Musculoskeletal: Negative for back pain. Skin: Negative for rash. Neurological: Negative for headaches    ____________________________________________   PHYSICAL EXAM:  VITAL SIGNS: ED Triage Vitals  Enc Vitals Group     BP 04/25/19 0806 (!) 142/88     Pulse Rate 04/25/19 0806 (!) 136     Resp 04/25/19 0806 18     Temp 04/25/19 0806 (!) 102.3 F (39.1 C)     Temp Source 04/25/19 0806 Oral     SpO2 04/25/19 0806 98 %     Weight 04/25/19 0749 111.1 kg (245 lb)     Height 04/25/19 0749 1.702 m (5\' 7" )     Head Circumference --      Peak Flow --      Pain Score 04/25/19 0749 7     Pain Loc --      Pain Edu? --      Excl. in GC? --     Constitutional: Alert and oriented.   Nose: No congestion/rhinnorhea. Mouth/Throat: Mucous membranes are moist.    Cardiovascular: Normal rate, regular rhythm. Grossly normal heart sounds.  Good peripheral circulation. Respiratory: Normal respiratory effort.  No retractions.  Gastrointestinal: Tenderness primarily left lower quadrant, no distention.  No CVA tenderness. Genitourinary: deferred Musculoskeletal:  Warm and well perfused Neurologic:  Normal speech and language. No gross focal neurologic deficits are appreciated.  Skin:  Skin is warm, dry and intact. No rash noted. Psychiatric: Mood and affect are normal. Speech and behavior are normal.  ____________________________________________   LABS (all labs ordered are listed, but only abnormal results are displayed)  Labs Reviewed  URINE CULTURE - Abnormal; Notable for the following components:      Result Value   Culture   (*)    Value: <10,000 COLONIES/mL INSIGNIFICANT GROWTH Performed at [facility], [address], [place], [place]    All other components within normal limits  COMPREHENSIVE METABOLIC PANEL - Abnormal; Notable for the following components:    Glucose, Bld 133 (*)    All other components within normal limits  CBC - Abnormal; Notable for the following components:   WBC 15.8 (*)    RBC 5.41 (*)    All other components within normal limits  URINALYSIS, COMPLETE (UACMP) WITH MICROSCOPIC - Abnormal; Notable for the following components:   Color, Urine YELLOW (*)    APPearance CLEAR (*)    Hgb urine dipstick MODERATE (*)    All other components within normal limits  LACTIC ACID, PLASMA - Abnormal; Notable for the following components:   Lactic Acid, Venous 2.1 (*)    All other components within normal limits  CULTURE, BLOOD (ROUTINE X 2)  CULTURE, BLOOD (ROUTINE X 2)  LIPASE, BLOOD  LACTIC ACID, PLASMA  POC URINE PREG, ED  POCT PREGNANCY, URINE  POC SARS CORONAVIRUS 2 AG -  ED  POC SARS CORONAVIRUS 2 AG   ____________________________________________  EKG  None ____________________________________________  RADIOLOGY  CT abdomen pelvis ____________________________________________   PROCEDURES  Procedure(s) performed: No  Procedures   Critical Care performed: No ____________________________________________   INITIAL IMPRESSION / ASSESSMENT AND PLAN / ED COURSE  Pertinent labs & imaging results that were available during my care of the patient were reviewed by me and considered in my medical decision making (see chart for details).  Patient presents with abdominal pain, primarily left lower quadrant, some left flank pain, no left CVA tenderness, urinalysis demonstrates hematuria however no clear infection.  Elevated white blood cell count, mildly elevated lactic acid.,  Febrile and tachycardic on arrival.  Will treat with IV fluids, morphine, Zofran, p.o. Tylenol.  Will obtain CT abdomen pelvis to evaluate further.  Differential includes ureterolithiasis, UTI/Pilo, diverticulitis   CT scan is unremarkable except for pancreatic lesion which I discussed with the patient that will require follow-up closely.  Lipase  is normal today.  Consideration for whether this may be novel coronavirus given her fever and tachycardia, we will send POC Covid swab  Patient feeling much better, we will obtain new vitals, anticipate discharge will Rx antibiotics send urine culture     ____________________________________________   FINAL CLINICAL IMPRESSION(S) / ED DIAGNOSES  Final diagnoses:  Cystitis        Note:  This document was prepared using Dragon voice recognition software and may include unintentional dictation errors.   Lavonia Drafts, MD 04/27/19 1121

## 2019-04-25 NOTE — ED Notes (Signed)
Bladder scan showed 614 ml in bladder

## 2019-04-25 NOTE — ED Triage Notes (Signed)
The pt has had a temp since 0230 this am  She was seen at [place] this am and diagnosed with a uti  This evening the pt has been vomiting and her temp will not stay down  lmp 2 months ago irregular periods

## 2019-04-25 NOTE — ED Notes (Signed)
Ems gave a LITER OF NSS ALSO  SHE HAS VOMITED BACK HER TYLENOL

## 2019-04-25 NOTE — ED Provider Notes (Signed)
Emergency Department Provider Note   I have reviewed the triage vital signs and the nursing notes.   HISTORY  Chief Complaint Fever   HPI Sylvia Cantrell is a 44 y.o. female with past medical history reviewed below presents to the emergency department with continued fever with new onset vomiting.  Patient was seen in the emergency department earlier today and discharged with prescription for Keflex and presumed cystitis.  Reports lower back pain worse on the left worsening over the last several days.  She reports some urine frequency but no specific dysuria.  Denies vaginal bleeding or discharge.  She is feeling somewhat better after evaluation in the emergency department and went home to rest.  She took the first dose of her Keflex.  She woke up later and felt feverish again so took a Tylenol.  She had a large volume, nonbloody emesis event shortly afterwards and with continued fever presented to the emergency department for reevaluation.  She denies severe headache or neck pain.  She has developed a cough but denies chest pain or shortness of breath. No known sick contacts.   Past Medical History:  Diagnosis Date   Allergy    Anemia    Anemia    Anxiety    Asthma    Chronic headache    Depression    Hypertension    IBS (irritable bowel syndrome)    Kidney stones    Nephrolithiasis    Obesity    Seizures (HCC)     Patient Active Problem List   Diagnosis Date Noted   Sepsis due to undetermined organism (HCC) 04/26/2019   Elevated blood pressure reading 09/12/2018   Mild intermittent asthma without complication 09/12/2018   Anemia 05/02/2018   Neck pain 06/21/2017   Anterior pleuritic pain 06/21/2017   Iron deficiency anemia due to chronic blood loss 02/28/2017   Situational anxiety 11/17/2016   Menorrhagia with regular cycle 08/11/2016   Anemia due to chronic blood loss 08/11/2016   NEPHROLITHIASIS, HX OF 02/15/2010   ALLERGIC RHINITIS  02/11/2010   Asthma 02/11/2010    Past Surgical History:  Procedure Laterality Date   ABLATION     BREAST SURGERY     TONSILLECTOMY  1987    Allergies Amoxicillin and Codeine  Family History  Problem Relation Age of Onset   Hypertension Mother    Hyperthyroidism Mother    Heart failure Father    Heart failure Maternal Grandmother    Hypertension Maternal Grandmother    Kidney disease Maternal Grandmother    Heart failure Paternal Grandfather     Social History Social History   Tobacco Use   Smoking status: Never Smoker   Smokeless tobacco: Never Used  Substance Use Topics   Alcohol use: No    Alcohol/week: 0.0 standard drinks   Drug use: No    Review of Systems  Constitutional: Positive fever/chills Eyes: No visual changes. ENT: No sore throat. Cardiovascular: Denies chest pain. Respiratory: Denies shortness of breath. Positive cough.  Gastrointestinal: LLQ abdominal pain.  Positive nausea and vomiting.  No diarrhea.  No constipation. Genitourinary: Negative for dysuria. Positive urine frequency.  Musculoskeletal: Positive left lower back pain.  Skin: Negative for rash. Neurological: Negative for headaches, focal weakness or numbness.  10-point ROS otherwise negative.  ____________________________________________   PHYSICAL EXAM:  VITAL SIGNS: Vitals:   04/25/19 2315 04/25/19 2330  BP: 109/62 109/65  Pulse: (!) 112 (!) 113  Resp: 17 18  Temp:    SpO2: 93% 92%  Constitutional: Dosing off during history but able to provide a full history with some prompting. . Eyes: Conjunctivae are normal. PERRL.   Atraumatic. Nose: No congestion/rhinnorhea. Mouth/Throat: Mucous membranes are dry.  Neck: No stridor.  No meningeal signs.   Cardiovascular: Normal rate, regular rhythm. Good peripheral circulation. Grossly normal heart sounds.   Respiratory: Normal respiratory effort.  No retractions. Lungs CTAB. Gastrointestinal: Soft and  nontender. No distention.  Musculoskeletal: No gross deformities of extremities. Neurologic:  Normal speech and language. No gross focal neurologic deficits are appreciated.  Skin:  Skin is warm, dry and intact. No rash noted.  ____________________________________________   LABS (all labs ordered are listed, but only abnormal results are displayed)  Labs Reviewed  COMPREHENSIVE METABOLIC PANEL - Abnormal; Notable for the following components:      Result Value   Potassium 3.1 (*)    Glucose, Bld 113 (*)    Calcium 8.3 (*)    Total Protein 6.0 (*)    Albumin 3.3 (*)    All other components within normal limits  CBC WITH DIFFERENTIAL/PLATELET - Abnormal; Notable for the following components:   Monocytes Absolute 0.0 (*)    All other components within normal limits  RESPIRATORY PANEL BY RT PCR (FLU A&B, COVID)  CULTURE, BLOOD (ROUTINE X 2)  CULTURE, BLOOD (ROUTINE X 2)  URINE CULTURE  LACTIC ACID, PLASMA  APTT  PROTIME-INR  LIPASE, BLOOD  LACTIC ACID, PLASMA  URINALYSIS, ROUTINE W REFLEX MICROSCOPIC  MAGNESIUM  PHOSPHORUS  I-STAT BETA HCG BLOOD, ED ([facility], [facility], [facility] ONLY)   ____________________________________________  EKG   EKG Interpretation  Date/Time:  Wednesday April 25 2019 21:01:49 EST Ventricular Rate:  116 PR Interval:    QRS Duration: 88 QT Interval:  275 QTC Calculation: 382 R Axis:   50 Text Interpretation: Sinus tachycardia Borderline repolarization abnormality Nonspecific ST changes. No STEMI Confirmed by Alona Bene [phone]) on 04/25/2019 9:28:32 PM       ____________________________________________  RADIOLOGY  CT Abdomen Pelvis W Contrast  Result Date: 04/25/2019 CLINICAL DATA:  Left flank and left lower quadrant pain since early this morning. Hematuria. EXAM: CT ABDOMEN AND PELVIS WITH CONTRAST TECHNIQUE: Multidetector CT imaging of the abdomen and pelvis was performed using the standard protocol following bolus administration of intravenous  contrast. CONTRAST:  OMNIPAQUE IOHEXOL 300 MG/ML  SOLN COMPARISON:  None. FINDINGS: Lower chest: No acute abnormality. Hepatobiliary: No focal liver abnormality is seen. No gallstones, gallbladder wall thickening, or biliary dilatation. Pancreas: There is a homogeneous mass in the pancreatic tail measuring 2.6 x 2.2 x 2.4 cm, average Hounsfield units of 43. No other pancreatic masses. No inflammation. Spleen: Normal in size without focal abnormality. Adrenals/Urinary Tract: No adrenal masses. Kidneys are normal in size, orientation and position with symmetric enhancement and excretion. No masses, stones or hydronephrosis. Normal ureters. Normal bladder. Stomach/Bowel: Normal stomach. Small bowel is normal in caliber it no inflammation colon is normal caliber. There are scattered colonic diverticula, mostly on the left. No diverticulitis, wall thickening or other inflammatory process. Normal appendix visualized. Vascular/Lymphatic: No significant vascular findings are present. No enlarged abdominal or pelvic lymph nodes. Reproductive: Uterus and bilateral adnexa are unremarkable. Other: No abdominal wall hernia or abnormality. No abdominopelvic ascites. Musculoskeletal: No acute or significant osseous findings. IMPRESSION: 1. No acute findings. No renal or ureteral stones or obstructive uropathy. No findings to account for the patient's left-sided pain and hematuria. 2. 2.6 cm pancreatic mass. Recommend further evaluation and characterization with pancreatic MRI without and  with contrast. 3. Colonic diverticulosis without evidence of diverticulitis. Electronically Signed   By: Lajean Manes M.D.   On: 04/25/2019 12:57   DG Chest Port 1 View  Result Date: 04/25/2019 CLINICAL DATA:  44 year old female with fever EXAM: PORTABLE CHEST 1 VIEW COMPARISON:  Chest radiograph dated 06/21/2017 FINDINGS: Left lung base nodular densities concerning for developing infiltrate, possibly viral or atypical. Clinical  correlation is recommended. No focal consolidation, pleural effusion or pneumothorax. Stable cardiomediastinal silhouette. No acute osseous pathology. IMPRESSION: Findings concerning for developing infiltrate at the left lung base. Clinical correlation is recommended. Electronically Signed   By: Anner Crete M.D.   On: 04/25/2019 21:47    ____________________________________________   PROCEDURES  Procedure(s) performed:   Procedures  CRITICAL CARE Performed by: Margette Fast Total critical care time: 35 minutes Critical care time was exclusive of separately billable procedures and treating other patients. Critical care was necessary to treat or prevent imminent or life-threatening deterioration. Critical care was time spent personally by me on the following activities: development of treatment plan with patient and/or surrogate as well as nursing, discussions with consultants, evaluation of patient's response to treatment, examination of patient, obtaining history from patient or surrogate, ordering and performing treatments and interventions, ordering and review of laboratory studies, ordering and review of radiographic studies, pulse oximetry and re-evaluation of patient's condition.  Nanda Quinton, MD Emergency Medicine  ____________________________________________   INITIAL IMPRESSION / ASSESSMENT AND PLAN / ED COURSE  Pertinent labs & imaging results that were available during my care of the patient were reviewed by me and considered in my medical decision making (see chart for details).   Patient returns to the emergency department with fever, new onset vomiting, lower back pain.  She has since developed some coughing.  Patient is not hypoxemic.  She is drowsy but also says that she has been up since approximately 2:30 AM.  No focal deficits.  Very low suspicion for CNS infection.  POC COVID negative at [facility].  CT from that evaluation reviewed along with lab work.  Patient found  to have leukocytosis.  No acute findings on CT to explain symptoms.   Given the patient's tachycardia here along with some somnolence I have activated a CODE SEPSIS and will start Abx. Will send PCR COVID test as well.   CXR with question developing infiltrate. Patient with cough now and borderline O2 sats. Continues to have nausea with vomiting at home. Leukocytosis improved. COVID PCR negative. Continued tachycardia. Will obs overnight with IV abx with continued abnormal vitals and patient not tolerating PO at home.    Discussed patient's case with [facility], Dr. Olevia Bowens to request admission. Patient and family (if present) updated with plan. Care transferred to [facility] service.  I reviewed all nursing notes, vitals, pertinent old records, EKGs, labs, imaging (as available).   ____________________________________________  FINAL CLINICAL IMPRESSION(S) / ED DIAGNOSES  Final diagnoses:  Community acquired pneumonia of left lower lobe of lung    MEDICATIONS GIVEN DURING THIS VISIT:  Medications  ceFEPIme (MAXIPIME) 2 g in sodium chloride 0.9 % 100 mL IVPB (0 g Intravenous Stopped 04/25/19 2227)  vancomycin (VANCOCIN) 2,500 mg in sodium chloride 0.9 % 500 mL IVPB (2,500 mg Intravenous New Bag/Given 04/25/19 2324)    Followed by  vancomycin (VANCOCIN) IVPB 1000 mg/200 mL premix (has no administration in time range)  0.9 % NaCl with KCl 40 mEq / L  infusion (has no administration in time range)  metroNIDAZOLE (FLAGYL) IVPB 500 mg (  0 mg Intravenous Stopped 04/25/19 2331)  sodium chloride 0.9 % bolus 1,000 mL (1,000 mLs Intravenous New Bag/Given 04/25/19 2227)  ondansetron (ZOFRAN) injection 4 mg (4 mg Intravenous Given 04/25/19 2236)    Note:  This document was prepared using Dragon voice recognition software and may include unintentional dictation errors.  Alona Bene, MD, [facility] Emergency Medicine    Kamorie Aldous, Arlyss Repress, MD 04/26/19 Jacinta Shoe

## 2019-04-25 NOTE — ED Notes (Signed)
Pt c/o a headache and nausea still

## 2019-04-25 NOTE — ED Notes (Signed)
Ems started an iv and gave the pt zofran 4mg  and fentanyl on the way here

## 2019-04-25 NOTE — Discharge Instructions (Signed)
Your blood tests and CT scan were all okay.  Your covid test in the ER was negative. Please follow up with your doctor in 1 week for re-evaluation of your symptoms.  In the meantime, take keflex for a likely urinary tract infection.

## 2019-04-25 NOTE — Progress Notes (Signed)
Pharmacy Antibiotic Note  Sylvia Cantrell is a 45 y.o. female admitted on 04/25/2019 with sepsis.  Pharmacy has been consulted for Cefepime and Vancomycin dosing.   Height: 5\' 6"  (167.6 cm) Weight: 245 lb (111.1 kg) IBW/kg (Calculated) : 59.3  Temp (24hrs), Avg:100.2 F (37.9 C), Min:99 F (37.2 C), Max:102.3 F (39.1 C)  Recent Labs  Lab 04/25/19 0808 04/25/19 1022  WBC 15.8*  --   CREATININE 0.78  --   LATICACIDVEN 2.1* 1.4    Estimated Creatinine Clearance: 114.5 mL/min (by C-G formula based on SCr of 0.78 mg/dL).    Allergies  Allergen Reactions  . Amoxicillin     Hives with first dose  . Codeine     REACTION: itching    Antimicrobials this admission: 1/13 Cefepime >>  1/13 Vancomycin >>   Dose adjustments this admission:   Microbiology results: 1/13 BCx: Pending 1/13 UCx: Pending   Plan:  - Cefepime 2g IV q8h  - LD of Vancomycin 2500mg  IV x 1 dose  - Followed by Vancomycin 1000mg  IV q12h - Est calc AUC 499 - Monitor patients renal fxn and opportunity to scale back ABX  Thank you for allowing pharmacy to be a part of this patient's care.  2/13  PharmD. BCPS  04/25/2019 9:42 PM

## 2019-04-25 NOTE — ED Triage Notes (Signed)
Abd pain , fever , "simular to my past UTI's"

## 2019-04-25 NOTE — ED Notes (Signed)
[phone] pts mother Melody, called [phone] for pt please update

## 2019-04-26 ENCOUNTER — Inpatient Hospital Stay (HOSPITAL_COMMUNITY): Payer: BC Managed Care – PPO

## 2019-04-26 ENCOUNTER — Encounter (HOSPITAL_COMMUNITY): Payer: Self-pay | Admitting: Internal Medicine

## 2019-04-26 DIAGNOSIS — K862 Cyst of pancreas: Secondary | ICD-10-CM | POA: Diagnosis present

## 2019-04-26 DIAGNOSIS — E669 Obesity, unspecified: Secondary | ICD-10-CM | POA: Diagnosis present

## 2019-04-26 DIAGNOSIS — J45909 Unspecified asthma, uncomplicated: Secondary | ICD-10-CM | POA: Diagnosis present

## 2019-04-26 DIAGNOSIS — Z8371 Family history of colonic polyps: Secondary | ICD-10-CM | POA: Diagnosis not present

## 2019-04-26 DIAGNOSIS — A419 Sepsis, unspecified organism: Secondary | ICD-10-CM | POA: Diagnosis present

## 2019-04-26 DIAGNOSIS — Z20822 Contact with and (suspected) exposure to covid-19: Secondary | ICD-10-CM | POA: Diagnosis present

## 2019-04-26 DIAGNOSIS — K8689 Other specified diseases of pancreas: Secondary | ICD-10-CM | POA: Diagnosis not present

## 2019-04-26 DIAGNOSIS — R519 Headache, unspecified: Secondary | ICD-10-CM | POA: Diagnosis present

## 2019-04-26 DIAGNOSIS — J189 Pneumonia, unspecified organism: Secondary | ICD-10-CM

## 2019-04-26 DIAGNOSIS — Z79899 Other long term (current) drug therapy: Secondary | ICD-10-CM | POA: Diagnosis not present

## 2019-04-26 DIAGNOSIS — E876 Hypokalemia: Secondary | ICD-10-CM | POA: Diagnosis present

## 2019-04-26 DIAGNOSIS — Z885 Allergy status to narcotic agent status: Secondary | ICD-10-CM | POA: Diagnosis not present

## 2019-04-26 DIAGNOSIS — K589 Irritable bowel syndrome without diarrhea: Secondary | ICD-10-CM | POA: Diagnosis present

## 2019-04-26 DIAGNOSIS — Z881 Allergy status to other antibiotic agents status: Secondary | ICD-10-CM | POA: Diagnosis not present

## 2019-04-26 DIAGNOSIS — I959 Hypotension, unspecified: Secondary | ICD-10-CM | POA: Diagnosis present

## 2019-04-26 DIAGNOSIS — Z87442 Personal history of urinary calculi: Secondary | ICD-10-CM | POA: Diagnosis not present

## 2019-04-26 DIAGNOSIS — F419 Anxiety disorder, unspecified: Secondary | ICD-10-CM | POA: Diagnosis present

## 2019-04-26 DIAGNOSIS — R823 Hemoglobinuria: Secondary | ICD-10-CM | POA: Diagnosis present

## 2019-04-26 DIAGNOSIS — R569 Unspecified convulsions: Secondary | ICD-10-CM | POA: Diagnosis present

## 2019-04-26 DIAGNOSIS — R000 Tachycardia, unspecified: Secondary | ICD-10-CM | POA: Diagnosis present

## 2019-04-26 DIAGNOSIS — D696 Thrombocytopenia, unspecified: Secondary | ICD-10-CM | POA: Diagnosis present

## 2019-04-26 DIAGNOSIS — I10 Essential (primary) hypertension: Secondary | ICD-10-CM | POA: Diagnosis present

## 2019-04-26 DIAGNOSIS — Z8249 Family history of ischemic heart disease and other diseases of the circulatory system: Secondary | ICD-10-CM | POA: Diagnosis not present

## 2019-04-26 DIAGNOSIS — Z6839 Body mass index (BMI) 39.0-39.9, adult: Secondary | ICD-10-CM | POA: Diagnosis not present

## 2019-04-26 DIAGNOSIS — F329 Major depressive disorder, single episode, unspecified: Secondary | ICD-10-CM | POA: Diagnosis present

## 2019-04-26 DIAGNOSIS — E559 Vitamin D deficiency, unspecified: Secondary | ICD-10-CM | POA: Diagnosis present

## 2019-04-26 LAB — CBC WITH DIFFERENTIAL/PLATELET
Abs Immature Granulocytes: 0.05 10*3/uL (ref 0.00–0.07)
Basophils Absolute: 0 10*3/uL (ref 0.0–0.1)
Basophils Relative: 0 %
Eosinophils Absolute: 0 10*3/uL (ref 0.0–0.5)
Eosinophils Relative: 0 %
HCT: 37 % (ref 36.0–46.0)
Hemoglobin: 11.5 g/dL — ABNORMAL LOW (ref 12.0–15.0)
Immature Granulocytes: 1 %
Lymphocytes Relative: 5 %
Lymphs Abs: 0.6 10*3/uL — ABNORMAL LOW (ref 0.7–4.0)
MCH: 27.1 pg (ref 26.0–34.0)
MCHC: 31.1 g/dL (ref 30.0–36.0)
MCV: 87.3 fL (ref 80.0–100.0)
Monocytes Absolute: 0.2 10*3/uL (ref 0.1–1.0)
Monocytes Relative: 2 %
Neutro Abs: 9.9 10*3/uL — ABNORMAL HIGH (ref 1.7–7.7)
Neutrophils Relative %: 92 %
Platelets: 140 10*3/uL — ABNORMAL LOW (ref 150–400)
RBC: 4.24 MIL/uL (ref 3.87–5.11)
RDW: 14.9 % (ref 11.5–15.5)
WBC: 10.8 10*3/uL — ABNORMAL HIGH (ref 4.0–10.5)
nRBC: 0 % (ref 0.0–0.2)

## 2019-04-26 LAB — LACTIC ACID, PLASMA: Lactic Acid, Venous: 1.1 mmol/L (ref 0.5–1.9)

## 2019-04-26 LAB — URINALYSIS, ROUTINE W REFLEX MICROSCOPIC
Bacteria, UA: NONE SEEN
Bilirubin Urine: NEGATIVE
Glucose, UA: NEGATIVE mg/dL
Ketones, ur: NEGATIVE mg/dL
Leukocytes,Ua: NEGATIVE
Nitrite: NEGATIVE
Protein, ur: NEGATIVE mg/dL
Specific Gravity, Urine: 1.016 (ref 1.005–1.030)
pH: 5 (ref 5.0–8.0)

## 2019-04-26 LAB — COMPREHENSIVE METABOLIC PANEL
ALT: 33 U/L (ref 0–44)
AST: 29 U/L (ref 15–41)
Albumin: 2.9 g/dL — ABNORMAL LOW (ref 3.5–5.0)
Alkaline Phosphatase: 69 U/L (ref 38–126)
Anion gap: 9 (ref 5–15)
BUN: 12 mg/dL (ref 6–20)
CO2: 21 mmol/L — ABNORMAL LOW (ref 22–32)
Calcium: 8.2 mg/dL — ABNORMAL LOW (ref 8.9–10.3)
Chloride: 112 mmol/L — ABNORMAL HIGH (ref 98–111)
Creatinine, Ser: 0.93 mg/dL (ref 0.44–1.00)
GFR calc Af Amer: >60 mL/min (ref >60)
GFR calc non Af Amer: >60 mL/min (ref >60)
Glucose, Bld: 155 mg/dL — ABNORMAL HIGH (ref 70–99)
Potassium: 3.4 mmol/L — ABNORMAL LOW (ref 3.5–5.1)
Sodium: 142 mmol/L (ref 135–145)
Total Bilirubin: 0.7 mg/dL (ref 0.3–1.2)
Total Protein: 5.5 g/dL — ABNORMAL LOW (ref 6.5–8.1)

## 2019-04-26 LAB — VITAMIN D 25 HYDROXY (VIT D DEFICIENCY, FRACTURES): Vit D, 25-Hydroxy: 13.77 ng/mL — ABNORMAL LOW (ref 30–100)

## 2019-04-26 LAB — MAGNESIUM: Magnesium: 1.7 mg/dL (ref 1.7–2.4)

## 2019-04-26 LAB — PROCALCITONIN: Procalcitonin: 54.63 ng/mL

## 2019-04-26 LAB — URINE CULTURE: Culture: <10000 — AB

## 2019-04-26 LAB — PHOSPHORUS: Phosphorus: <1 mg/dL — CL (ref 2.5–4.6)

## 2019-04-26 LAB — I-STAT BETA HCG BLOOD, ED (MC, WL, AP ONLY): I-stat hCG, quantitative: <5 m[IU]/mL (ref <5)

## 2019-04-26 MED ORDER — ONDANSETRON HCL 4 MG/2ML IJ SOLN
4.0000 mg | Freq: Once | INTRAMUSCULAR | Status: AC
  Administered 2019-04-26: 01:00:00 4 mg via INTRAVENOUS
  Filled 2019-04-26: qty 2

## 2019-04-26 MED ORDER — POTASSIUM CHLORIDE IN NACL 40-0.9 MEQ/L-% IV SOLN
INTRAVENOUS | Status: AC
  Administered 2019-04-26: 02:00:00 250 mL/h via INTRAVENOUS
  Filled 2019-04-26: qty 1000

## 2019-04-26 MED ORDER — METRONIDAZOLE IN NACL 5-0.79 MG/ML-% IV SOLN
500.0000 mg | Freq: Three times a day (TID) | INTRAVENOUS | Status: DC
  Administered 2019-04-26 – 2019-04-28 (×7): 500 mg via INTRAVENOUS
  Filled 2019-04-26 (×7): qty 100

## 2019-04-26 MED ORDER — GADOBUTROL 1 MMOL/ML IV SOLN
9.0000 mL | Freq: Once | INTRAVENOUS | Status: AC | PRN
  Administered 2019-04-26: 14:00:00 9 mL via INTRAVENOUS

## 2019-04-26 MED ORDER — ALBUTEROL SULFATE (2.5 MG/3ML) 0.083% IN NEBU
2.5000 mg | INHALATION_SOLUTION | Freq: Four times a day (QID) | RESPIRATORY_TRACT | Status: DC | PRN
  Administered 2019-04-28: 08:00:00 2.5 mg via RESPIRATORY_TRACT
  Filled 2019-04-26: qty 3

## 2019-04-26 MED ORDER — KETOROLAC TROMETHAMINE 30 MG/ML IJ SOLN
30.0000 mg | Freq: Once | INTRAMUSCULAR | Status: AC
  Administered 2019-04-26: 01:00:00 30 mg via INTRAVENOUS
  Filled 2019-04-26: qty 1

## 2019-04-26 MED ORDER — ACETAMINOPHEN 650 MG RE SUPP: 650.0000 mg | Freq: Four times a day (QID) | RECTAL | Status: DC | PRN

## 2019-04-26 MED ORDER — ALBUTEROL SULFATE HFA 108 (90 BASE) MCG/ACT IN AERS
2.0000 | INHALATION_SPRAY | Freq: Four times a day (QID) | RESPIRATORY_TRACT | Status: DC | PRN
  Filled 2019-04-26: qty 6.7

## 2019-04-26 MED ORDER — LORAZEPAM 1 MG PO TABS: 1.0000 mg | ORAL_TABLET | Freq: Two times a day (BID) | ORAL | Status: DC | PRN

## 2019-04-26 MED ORDER — FLUTICASONE PROPIONATE 50 MCG/ACT NA SUSP
2.0000 | Freq: Every day | NASAL | Status: DC
  Administered 2019-04-27 – 2019-04-28 (×2): 2 via NASAL
  Filled 2019-04-26: qty 16

## 2019-04-26 MED ORDER — SODIUM PHOSPHATES 45 MMOLE/15ML IV SOLN
30.0000 mmol | Freq: Once | INTRAVENOUS | Status: AC
  Administered 2019-04-26: 05:00:00 30 mmol via INTRAVENOUS
  Filled 2019-04-26: qty 10

## 2019-04-26 MED ORDER — FLUTICASONE FUROATE-VILANTEROL 100-25 MCG/INH IN AEPB
1.0000 | INHALATION_SPRAY | Freq: Every day | RESPIRATORY_TRACT | Status: DC
  Filled 2019-04-26: qty 28

## 2019-04-26 MED ORDER — ESCITALOPRAM OXALATE 10 MG PO TABS
5.0000 mg | ORAL_TABLET | Freq: Every day | ORAL | Status: DC
  Administered 2019-04-26 – 2019-04-28 (×3): 5 mg via ORAL
  Filled 2019-04-26 (×3): qty 1

## 2019-04-26 MED ORDER — ONDANSETRON 4 MG PO TBDP
4.0000 mg | ORAL_TABLET | Freq: Three times a day (TID) | ORAL | Status: DC | PRN
  Administered 2019-04-26 – 2019-04-27 (×4): 4 mg via ORAL
  Filled 2019-04-26 (×5): qty 1

## 2019-04-26 MED ORDER — MAGNESIUM SULFATE 2 GM/50ML IV SOLN
2.0000 g | Freq: Once | INTRAVENOUS | Status: DC
  Filled 2019-04-26: qty 50

## 2019-04-26 MED ORDER — ENOXAPARIN SODIUM 40 MG/0.4ML ~~LOC~~ SOLN
40.0000 mg | Freq: Every day | SUBCUTANEOUS | Status: DC
  Administered 2019-04-26: 11:00:00 40 mg via SUBCUTANEOUS
  Filled 2019-04-26: qty 0.4

## 2019-04-26 MED ORDER — POTASSIUM CHLORIDE CRYS ER 20 MEQ PO TBCR
20.0000 meq | EXTENDED_RELEASE_TABLET | Freq: Once | ORAL | Status: AC
  Administered 2019-04-26: 11:00:00 20 meq via ORAL
  Filled 2019-04-26: qty 1

## 2019-04-26 MED ORDER — POTASSIUM CHLORIDE IN NACL 20-0.9 MEQ/L-% IV SOLN
INTRAVENOUS | Status: DC
  Filled 2019-04-26 (×6): qty 1000

## 2019-04-26 MED ORDER — ENOXAPARIN SODIUM 40 MG/0.4ML ~~LOC~~ SOLN
40.0000 mg | Freq: Every day | SUBCUTANEOUS | Status: DC
  Administered 2019-04-28: 10:00:00 40 mg via SUBCUTANEOUS
  Filled 2019-04-26: qty 0.4

## 2019-04-26 MED ORDER — ACETAMINOPHEN 325 MG PO TABS
650.0000 mg | ORAL_TABLET | Freq: Four times a day (QID) | ORAL | Status: DC | PRN
  Administered 2019-04-26 – 2019-04-28 (×4): 650 mg via ORAL
  Filled 2019-04-26 (×6): qty 2

## 2019-04-26 NOTE — ED Notes (Signed)
Patient transported to MRI 

## 2019-04-26 NOTE — ED Notes (Signed)
hospitalist at bedside

## 2019-04-26 NOTE — ED Notes (Signed)
ED TO INPATIENT HANDOFF REPORT  ED Nurse Name and Phone #: Lowanda Foster [phone]  S Name/Age/Gender Sylvia Cantrell 43 y.o. female Room/Bed: 053C/053C  Code Status   Code Status: Full Code  Home/SNF/Other Home Patient oriented to: self, place, time and situation Is this baseline? Yes   Triage Complete: Triage complete  Chief Complaint Sepsis due to undetermined organism [facility]) [A41.9]  Triage Note The pt has had a temp since 0230 this am  She was seen at [place] this am and diagnosed with a uti  This evening the pt has been vomiting and her temp will not stay down  lmp 2 months ago irregular periods    Allergies Allergies  Allergen Reactions  . Amoxicillin     Hives with first dose  . Codeine     REACTION: itching    Level of Care/Admitting Diagnosis ED Disposition    ED Disposition Condition Comment   Admit  Hospital Area: [facility] [[id number]]  Level of Care: Progressive [102]  Admit to Progressive based on following criteria: MULTISYSTEM THREATS such as stable sepsis, metabolic/electrolyte imbalance with or without encephalopathy that is responding to early treatment.  Admit to Progressive based on following criteria: RESPIRATORY PROBLEMS hypoxemic/hypercapnic respiratory failure that is responsive to NIPPV (BiPAP) or High Flow Nasal Cannula (6-80 lpm). Frequent assessment/intervention, no > Q2 hrs < Q4 hrs, to maintain oxygenation and pulmonary hygiene.  Covid Evaluation: Confirmed COVID Negative  Diagnosis: Sepsis due to undetermined organism [facility]) [[id number]]  Admitting Physician: Bobette Mo [[id number]]  Attending Physician: Bobette Mo [[id number]]  Estimated length of stay: past midnight tomorrow  Certification:: I certify this patient will need inpatient services for at least 2 midnights       B Medical/Surgery History Past Medical History:  Diagnosis Date  . Allergy   . Anemia   . Anxiety   . Asthma   . Chronic headache    . Depression   . Hypertension   . IBS (irritable bowel syndrome)   . Kidney stones   . Nephrolithiasis   . Obesity   . Seizures (HCC)    Past Surgical History:  Procedure Laterality Date  . ABLATION    . BREAST SURGERY    . TONSILLECTOMY  1987     A IV Location/Drains/Wounds Patient Lines/Drains/Airways Status   Active Line/Drains/Airways    Name:   Placement date:   Placement time:   Site:   Days:   Peripheral IV 04/25/19 Right Antecubital   04/25/19    2000    Antecubital   1   Peripheral IV 04/26/19 Right;Anterior Forearm   04/26/19    0639    Forearm   less than 1          Intake/Output Last 24 hours  Intake/Output Summary (Last 24 hours) at 04/26/2019 1956 Last data filed at 04/26/2019 0036 Gross per 24 hour  Intake 1000 ml  Output --  Net 1000 ml    Labs/Imaging Results for orders placed or performed during the hospital encounter of 04/25/19 (from the past 48 hour(s))  Lactic acid, plasma     Status: None   Collection Time: 04/25/19  8:56 PM  Result Value Ref Range   Lactic Acid, Venous 1.8 0.5 - 1.9 mmol/L    Comment: Performed at [facility], [address] [place], [place] [address]  Comprehensive metabolic panel     Status: Abnormal   Collection Time: 04/25/19  9:22 PM  Result Value Ref Range  Sodium 142 135 - 145 mmol/L   Potassium 3.1 (L) 3.5 - 5.1 mmol/L   Chloride 111 98 - 111 mmol/L   CO2 22 22 - 32 mmol/L   Glucose, Bld 113 (H) 70 - 99 mg/dL   BUN 12 6 - 20 mg/dL   Creatinine, Ser 1.00 0.44 - 1.00 mg/dL   Calcium 8.3 (L) 8.9 - 10.3 mg/dL   Total Protein 6.0 (L) 6.5 - 8.1 g/dL   Albumin 3.3 (L) 3.5 - 5.0 g/dL   AST 25 15 - 41 U/L   ALT 24 0 - 44 U/L   Alkaline Phosphatase 71 38 - 126 U/L   Total Bilirubin 0.8 0.3 - 1.2 mg/dL   GFR calc non Af Amer >60 >60 mL/min   GFR calc Af Amer >60 >60 mL/min   Anion gap 9 5 - 15    Comment: Performed at [facility] [address], [place], [place] [address]  CBC WITH DIFFERENTIAL      Status: Abnormal   Collection Time: 04/25/19  9:22 PM  Result Value Ref Range   WBC 5.0 4.0 - 10.5 K/uL   RBC 4.64 3.87 - 5.11 MIL/uL   Hemoglobin 12.8 12.0 - 15.0 g/dL   HCT 40.5 36.0 - 46.0 %   MCV 87.3 80.0 - 100.0 fL   MCH 27.6 26.0 - 34.0 pg   MCHC 31.6 30.0 - 36.0 g/dL   RDW 14.6 11.5 - 15.5 %   Platelets 151 150 - 400 K/uL   nRBC 0.0 0.0 - 0.2 %   Neutrophils Relative % 78 %   Neutro Abs 3.8 1.7 - 7.7 K/uL   Lymphocytes Relative 21 %   Lymphs Abs 1.0 0.7 - 4.0 K/uL   Monocytes Relative 1 %   Monocytes Absolute 0.0 (L) 0.1 - 1.0 K/uL   Eosinophils Relative 0 %   Eosinophils Absolute 0.0 0.0 - 0.5 K/uL   Basophils Relative 0 %   Basophils Absolute 0.0 0.0 - 0.1 K/uL   Immature Granulocytes 0 %   Abs Immature Granulocytes 0.02 0.00 - 0.07 K/uL    Comment: Performed at [facility] [address] [place], [place] [address]  APTT     Status: None   Collection Time: 04/25/19  9:22 PM  Result Value Ref Range   aPTT 29 24 - 36 seconds    Comment: Performed at [facility] [address] [place], [place] [address]  Protime-INR     Status: None   Collection Time: 04/25/19  9:22 PM  Result Value Ref Range   Prothrombin Time 13.6 11.4 - 15.2 seconds   INR 1.1 0.8 - 1.2    Comment: (NOTE) INR goal varies based on device and disease states. Performed at [facility], [place] [address], [place], [place] [address]   Lipase, blood     Status: None   Collection Time: 04/25/19  9:22 PM  Result Value Ref Range   Lipase 34 11 - 51 U/L    Comment: Performed at [facility] [address], [place], [place] [address]  Magnesium     Status: None   Collection Time: 04/25/19  9:22 PM  Result Value Ref Range   Magnesium 1.7 1.7 - 2.4 mg/dL    Comment: Performed at [facility] [address], [place], [place] [address]  Phosphorus     Status: Abnormal   Collection Time: 04/25/19  9:22 PM  Result  Value Ref Range   Phosphorus <1.0 (LL) 2.5 - 4.6 mg/dL     Comment: CRITICAL RESULT CALLED TO, READ BACK BY AND VERIFIED WITH: CHRISCO C,RN 04/26/19 0136 WAYK Performed at [facility], [address], [place], [place] [address]   Blood Culture (routine x 2)     Status: None (Preliminary result)   Collection Time: 04/25/19  9:45 PM   Specimen: BLOOD  Result Value Ref Range   Specimen Description BLOOD LEFT HAND    Special Requests      BOTTLES DRAWN AEROBIC AND ANAEROBIC Blood Culture adequate volume   Culture      NO GROWTH < 24 HOURS Performed at [facility], [address], [place], [place] [address]    Report Status PENDING   Blood Culture (routine x 2)     Status: None (Preliminary result)   Collection Time: 04/25/19  9:49 PM   Specimen: BLOOD  Result Value Ref Range   Specimen Description BLOOD RIGHT HAND    Special Requests      BOTTLES DRAWN AEROBIC AND ANAEROBIC Blood Culture adequate volume   Culture      NO GROWTH < 24 HOURS Performed at [facility], [address], [place], [place] [address]    Report Status PENDING   Respiratory Panel by RT PCR (Flu A&B, Covid) - Nasopharyngeal Swab     Status: None   Collection Time: 04/25/19 10:40 PM   Specimen: Nasopharyngeal Swab  Result Value Ref Range   SARS Coronavirus 2 by RT PCR NEGATIVE NEGATIVE    Comment: (NOTE) SARS-CoV-2 target nucleic acids are NOT DETECTED. The SARS-CoV-2 RNA is generally detectable in upper respiratoy specimens during the acute phase of infection. The lowest concentration of SARS-CoV-2 viral copies this assay can detect is 131 copies/mL. A negative result does not preclude SARS-Cov-2 infection and should not be used as the sole basis for treatment or other patient management decisions. A negative result may occur with  improper specimen collection/handling, submission of specimen other than nasopharyngeal swab, presence of viral mutation(s) within the areas targeted by this assay, and inadequate number of viral copies (<131  copies/mL). A negative result must be combined with clinical observations, patient history, and epidemiological information. The expected result is Negative. Fact Sheet for Patients:  https://www.moore.com/ Fact Sheet for Healthcare Providers:  https://www.young.biz/ This test is not yet ap proved or cleared by the [place] FDA and  has been authorized for detection and/or diagnosis of SARS-CoV-2 by FDA under an Emergency Use Authorization (EUA). This EUA will remain  in effect (meaning this test can be used) for the duration of the COVID-19 declaration under Section 564(b)(1) of the Act, 21 U.S.C. section 360bbb-3(b)(1), unless the authorization is terminated or revoked sooner.    Influenza A by PCR NEGATIVE NEGATIVE   Influenza B by PCR NEGATIVE NEGATIVE    Comment: (NOTE) The Xpert Xpress SARS-CoV-2/FLU/RSV assay is intended as an aid in  the diagnosis of influenza from Nasopharyngeal swab specimens and  should not be used as a sole basis for treatment. Nasal washings and  aspirates are unacceptable for Xpert Xpress SARS-CoV-2/FLU/RSV  testing. Fact Sheet for Patients: https://www.moore.com/ Fact Sheet for Healthcare Providers: https://www.young.biz/ This test is not yet approved or cleared by the [place] FDA and  has been authorized for detection and/or diagnosis of SARS-CoV-2 by  FDA under an Emergency Use Authorization (EUA). This EUA will remain  in effect (meaning this test can be  used) for the duration of the  Covid-19 declaration under Section 564(b)(1) of the Act, 21  U.S.C. section 360bbb-3(b)(1), unless the authorization is  terminated or revoked. Performed at [facility], [address], [place], [place] [address]   Urinalysis, Routine w reflex microscopic     Status: Abnormal   Collection Time: 04/26/19 12:38 AM  Result Value Ref Range   Color, Urine YELLOW YELLOW    APPearance CLEAR CLEAR   Specific Gravity, Urine 1.016 1.005 - 1.030   pH 5.0 5.0 - 8.0   Glucose, UA NEGATIVE NEGATIVE mg/dL   Hgb urine dipstick MODERATE (A) NEGATIVE   Bilirubin Urine NEGATIVE NEGATIVE   Ketones, ur NEGATIVE NEGATIVE mg/dL   Protein, ur NEGATIVE NEGATIVE mg/dL   Nitrite NEGATIVE NEGATIVE   Leukocytes,Ua NEGATIVE NEGATIVE   RBC / HPF 0-5 0 - 5 RBC/hpf   WBC, UA 0-5 0 - 5 WBC/hpf   Bacteria, UA NONE SEEN NONE SEEN   Squamous Epithelial / LPF 0-5 0 - 5   Mucus PRESENT     Comment: Performed at [facility], [address], [place], [place] [address]  Lactic acid, plasma     Status: None   Collection Time: 04/26/19  5:08 AM  Result Value Ref Range   Lactic Acid, Venous 1.1 0.5 - 1.9 mmol/L    Comment: Performed at [facility], [address] [place], [place] [address]  CBC WITH DIFFERENTIAL     Status: Abnormal   Collection Time: 04/26/19  5:08 AM  Result Value Ref Range   WBC 10.8 (H) 4.0 - 10.5 K/uL   RBC 4.24 3.87 - 5.11 MIL/uL   Hemoglobin 11.5 (L) 12.0 - 15.0 g/dL   HCT 88.4 16.6 - 06.3 %   MCV 87.3 80.0 - 100.0 fL   MCH 27.1 26.0 - 34.0 pg   MCHC 31.1 30.0 - 36.0 g/dL   RDW 01.6 01.0 - 93.2 %   Platelets 140 (L) 150 - 400 K/uL   nRBC 0.0 0.0 - 0.2 %   Neutrophils Relative % 92 %   Neutro Abs 9.9 (H) 1.7 - 7.7 K/uL   Lymphocytes Relative 5 %   Lymphs Abs 0.6 (L) 0.7 - 4.0 K/uL   Monocytes Relative 2 %   Monocytes Absolute 0.2 0.1 - 1.0 K/uL   Eosinophils Relative 0 %   Eosinophils Absolute 0.0 0.0 - 0.5 K/uL   Basophils Relative 0 %   Basophils Absolute 0.0 0.0 - 0.1 K/uL   Immature Granulocytes 1 %   Abs Immature Granulocytes 0.05 0.00 - 0.07 K/uL    Comment: Performed at [facility], [address] [place], [place] [address]  Comprehensive metabolic panel     Status: Abnormal   Collection Time: 04/26/19  5:08 AM  Result Value Ref Range   Sodium 142 135 - 145 mmol/L   Potassium 3.4 (L) 3.5 - 5.1 mmol/L   Chloride 112 (H)  98 - 111 mmol/L   CO2 21 (L) 22 - 32 mmol/L   Glucose, Bld 155 (H) 70 - 99 mg/dL   BUN 12 6 - 20 mg/dL   Creatinine, Ser 2.20 0.44 - 1.00 mg/dL   Calcium 8.2 (L) 8.9 - 10.3 mg/dL   Total Protein 5.5 (L) 6.5 - 8.1 g/dL   Albumin 2.9 (L) 3.5 - 5.0 g/dL   AST 29 15 - 41 U/L   ALT 33 0 - 44 U/L   Alkaline Phosphatase 69  38 - 126 U/L   Total Bilirubin 0.7 0.3 - 1.2 mg/dL   GFR calc non Af Amer >60 >60 mL/min   GFR calc Af Amer >60 >60 mL/min   Anion gap 9 5 - 15    Comment: Performed at [facility], [address] [place], [place] [address]  VITAMIN D 25 Hydroxy (Vit-D Deficiency, Fractures)     Status: Abnormal   Collection Time: 04/26/19  5:08 AM  Result Value Ref Range   Vit D, 25-Hydroxy 13.77 (L) 30 - 100 ng/mL    Comment: (NOTE) Vitamin D deficiency has been defined by the Institute of Medicine  and an Endocrine Society practice guideline as a level of serum 25-OH  vitamin D less than 20 ng/mL (1,2). The Endocrine Society went on to  further define vitamin D insufficiency as a level between 21 and 29  ng/mL (2). 1. IOM (Institute of Medicine). 2010. Dietary reference intakes for  calcium and D. [place]: The [employer] Academ[employer] Press. 2. Holick MF, Binkley [place], Bischoff-Ferrari HA, et al. Evaluation,  treatment, and prevention of vitamin D deficiency: an Endocrine  Society clinical practice guideline, JCEM. 2011 Jul; 96(7): 1911-30. Performed at [facility], [address] [place], [place] [address]   Procalcitonin     Status: None   Collection Time: 04/26/19  5:08 AM  Result Value Ref Range   Procalcitonin 54.63 ng/mL    Comment:        Interpretation: PCT >= 10 ng/mL: Important systemic inflammatory response, almost exclusively due to severe bacterial sepsis or septic shock. (NOTE)       Sepsis PCT Algorithm           Lower Respiratory Tract                                      Infection PCT Algorithm    ----------------------------      ----------------------------         PCT < 0.25 ng/mL                PCT < 0.10 ng/mL         Strongly encourage             Strongly discourage   discontinuation of antibiotics    initiation of antibiotics    ----------------------------     -----------------------------       PCT 0.25 - 0.50 ng/mL            PCT 0.10 - 0.25 ng/mL               OR       >80% decrease in PCT            Discourage initiation of                                            antibiotics      Encourage discontinuation           of antibiotics    ----------------------------     -----------------------------         PCT >= 0.50 ng/mL              PCT 0.26 - 0.50 ng/mL  AND       <80% decrease in PCT             Encourage initiation of                                             antibiotics       Encourage continuation           of antibiotics    ----------------------------     -----------------------------        PCT >= 0.50 ng/mL                  PCT > 0.50 ng/mL               AND         increase in PCT                  Strongly encourage                                      initiation of antibiotics    Strongly encourage escalation           of antibiotics                                     -----------------------------                                           PCT <= 0.25 ng/mL                                                 OR                                        > 80% decrease in PCT                                     Discontinue / Do not initiate                                             antibiotics Performed at [facility], [address] [place], [place] [address]   I-Stat beta hCG blood, ED     Status: None   Collection Time: 04/26/19  5:14 AM  Result Value Ref Range   I-stat hCG, quantitative <5.0 <5 mIU/mL   Comment 3            Comment:   GEST. AGE      CONC.  (mIU/mL)   <=1 WEEK        5 - 50     2 WEEKS       50 -  500     3 WEEKS       100 - 10,000     4  WEEKS     1,000 - 30,000        FEMALE AND NON-PREGNANT FEMALE:     LESS THAN 5 mIU/mL    MR ABDOMEN W WO CONTRAST  Result Date: 04/26/2019 CLINICAL DATA:  Evaluate pancreatic lesions seen on recent CT scan. EXAM: MRI ABDOMEN WITHOUT AND WITH CONTRAST TECHNIQUE: Multiplanar multisequence MR imaging of the abdomen was performed both before and after the administration of intravenous contrast. CONTRAST:  9mL GADAVIST GADOBUTROL 1 MMOL/ML IV SOLN COMPARISON:  CT scan 04/25/2019 FINDINGS: Lower chest: The lung bases are grossly clear. No pulmonary lesions, pleural or pericardial effusion. Hepatobiliary: No focal hepatic lesions or intrahepatic biliary dilatation. The gallbladder appears normal. The portal and hepatic veins are patent. No common bile duct dilatation. Pancreas: As demonstrated on the CT scan there is a 3.1 x 2.7 cm lesion in the pancreatic tail. This appears fairly well encapsulated and appears largely cystic on the T2 weighted images with septations and faint nodularity. The lesion demonstrates subsequent internal contrast enhancement. Given the patient's age and this location AP mucinous cystic neoplasm or mucinous cystadenoma would be a strong consideration. A solid pseudopapillary tumor is also a consideration. There is a 6 mm exophytic cyst projecting off the pancreatic head (image 28/4. No contrast enhancement is demonstrated. 7 mm cystic lesion noted in the pancreatic head inferiorly on image 33/4. No contrast enhancement is demonstrated. These are both likely benign postinflammatory cysts. Spleen:  Within normal limits in size.  No splenic lesions. Adrenals/Urinary Tract: The adrenal glands and kidneys are unremarkable. Stomach/Bowel: Visualized portions within the abdomen are unremarkable. Vascular/Lymphatic: The aorta and branch vessels are normal. The major venous structures are patent. No mesenteric or retroperitoneal mass or adenopathy. Other:  No ascites or abdominal wall hernia.  Musculoskeletal: No significant bony findings. IMPRESSION: 1. 3.1 x 2.7 cm cystic appearing lesion in the pancreatic tail, most likely a mucinous cystadenoma or mucinous cystic neoplasm. Solid pseudopapillary tumor would also be a consideration in this age. Recommend endoscopic biopsy (if possible in this location). 2. Two small (sub 8 mm) cystic lesions in the pancreatic head without worrisome MR imaging features. These will require surveillance. 3. No findings for metastatic disease or adenopathy. Electronically Signed   By: Rudie MeyerP.  Gallerani M.D.   On: 04/26/2019 14:11   CT Abdomen Pelvis W Contrast  Result Date: 04/25/2019 CLINICAL DATA:  Left flank and left lower quadrant pain since early this morning. Hematuria. EXAM: CT ABDOMEN AND PELVIS WITH CONTRAST TECHNIQUE: Multidetector CT imaging of the abdomen and pelvis was performed using the standard protocol following bolus administration of intravenous contrast. CONTRAST:  100mL OMNIPAQUE IOHEXOL 300 MG/ML  SOLN COMPARISON:  None. FINDINGS: Lower chest: No acute abnormality. Hepatobiliary: No focal liver abnormality is seen. No gallstones, gallbladder wall thickening, or biliary dilatation. Pancreas: There is a homogeneous mass in the pancreatic tail measuring 2.6 x 2.2 x 2.4 cm, average Hounsfield units of 43. No other pancreatic masses. No inflammation. Spleen: Normal in size without focal abnormality. Adrenals/Urinary Tract: No adrenal masses. Kidneys are normal in size, orientation and position with symmetric enhancement and excretion. No masses, stones or hydronephrosis. Normal ureters. Normal bladder. Stomach/Bowel: Normal stomach. Small bowel is normal in caliber it no inflammation colon is normal caliber. There are scattered colonic diverticula, mostly on the left. No diverticulitis, wall thickening or other inflammatory process. Normal appendix  visualized. Vascular/Lymphatic: No significant vascular findings are present. No enlarged abdominal or pelvic  lymph nodes. Reproductive: Uterus and bilateral adnexa are unremarkable. Other: No abdominal wall hernia or abnormality. No abdominopelvic ascites. Musculoskeletal: No acute or significant osseous findings. IMPRESSION: 1. No acute findings. No renal or ureteral stones or obstructive uropathy. No findings to account for the patient's left-sided pain and hematuria. 2. 2.6 cm pancreatic mass. Recommend further evaluation and characterization with pancreatic MRI without and with contrast. 3. Colonic diverticulosis without evidence of diverticulitis. Electronically Signed   By: Amie Portland M.D.   On: 04/25/2019 12:57   DG Chest Port 1 View  Result Date: 04/25/2019 CLINICAL DATA:  44 year old female with fever EXAM: PORTABLE CHEST 1 VIEW COMPARISON:  Chest radiograph dated 06/21/2017 FINDINGS: Left lung base nodular densities concerning for developing infiltrate, possibly viral or atypical. Clinical correlation is recommended. No focal consolidation, pleural effusion or pneumothorax. Stable cardiomediastinal silhouette. No acute osseous pathology. IMPRESSION: Findings concerning for developing infiltrate at the left lung base. Clinical correlation is recommended. Electronically Signed   By: Elgie Collard M.D.   On: 04/25/2019 21:47    Pending Labs Unresulted Labs (From admission, onward)    Start     Ordered   04/27/19 0500  HIV Antibody (routine testing w rflx)  (HIV Antibody (Routine testing w reflex) panel)  Tomorrow morning,   R     04/26/19 0128   04/27/19 0500  CBC  Daily,   R     04/26/19 0946   04/27/19 0500  Comprehensive metabolic panel  Daily,   R     04/26/19 0946   04/25/19 2122  Urine culture  ONCE - STAT,   STAT     04/25/19 2123          Vitals/Pain Today's Vitals   04/26/19 1800 04/26/19 1830 04/26/19 1900 04/26/19 1930  BP: 116/79 103/66 105/74 (!) 120/92  Pulse: 84 76 90 83  Resp: 13 20 15 14   Temp:      TempSrc:      SpO2: 97% 94% 97% 97%  Weight:      Height:       PainSc:        Isolation Precautions No active isolations  Medications Medications  ceFEPIme (MAXIPIME) 2 g in sodium chloride 0.9 % 100 mL IVPB (0 g Intravenous Stopped 04/26/19 1442)  vancomycin (VANCOCIN) 2,500 mg in sodium chloride 0.9 % 500 mL IVPB (0 mg Intravenous Stopped 04/26/19 0155)    Followed by  vancomycin (VANCOCIN) IVPB 1000 mg/200 mL premix (0 mg Intravenous Stopped 04/26/19 1337)  0.9 % NaCl with KCl 40 mEq / L  infusion (0 mL/hr Intravenous Stopped 04/26/19 0617)  acetaminophen (TYLENOL) tablet 650 mg (650 mg Oral Given 04/26/19 1610)    Or  acetaminophen (TYLENOL) suppository 650 mg ( Rectal See Alternative 04/26/19 1610)  0.9 % NaCl with KCl 20 mEq/ L  infusion ( Intravenous New Bag/Given (Non-Interop) 04/26/19 1859)  albuterol (VENTOLIN HFA) 108 (90 Base) MCG/ACT inhaler 2 puff (has no administration in time range)  escitalopram (LEXAPRO) tablet 5 mg (5 mg Oral Given 04/26/19 1123)  fluticasone (FLONASE) 50 MCG/ACT nasal spray 2 spray (2 sprays Each Nare Not Given 04/26/19 1125)  fluticasone furoate-vilanterol (BREO ELLIPTA) 100-25 MCG/INH 1 puff (1 puff Inhalation Not Given 04/26/19 1125)  LORazepam (ATIVAN) tablet 1 mg (has no administration in time range)  magnesium sulfate IVPB 2 g 50 mL (0 g Intravenous Stopped 04/26/19 0845)  metroNIDAZOLE (FLAGYL) IVPB  500 mg (0 mg Intravenous Stopped 04/26/19 1554)  ondansetron (ZOFRAN-ODT) disintegrating tablet 4 mg (4 mg Oral Given 04/26/19 1409)  enoxaparin (LOVENOX) injection 40 mg (has no administration in time range)  metroNIDAZOLE (FLAGYL) IVPB 500 mg (0 mg Intravenous Stopped 04/25/19 2331)  sodium chloride 0.9 % bolus 1,000 mL (0 mLs Intravenous Stopped 04/26/19 0036)  ondansetron (ZOFRAN) injection 4 mg (4 mg Intravenous Given 04/25/19 2236)  ketorolac (TORADOL) 30 MG/ML injection 30 mg (30 mg Intravenous Given 04/26/19 0035)  ondansetron (ZOFRAN) injection 4 mg (4 mg Intravenous Given 04/26/19 0035)  sodium phosphate 30 mmol in  dextrose 5 % 250 mL infusion (0 mmol Intravenous Stopped 04/26/19 1114)  potassium chloride SA (KLOR-CON) CR tablet 20 mEq (20 mEq Oral Given 04/26/19 1123)  gadobutrol (GADAVIST) 1 MMOL/ML injection 9 mL (9 mLs Intravenous Contrast Given 04/26/19 1347)    Mobility walks Low fall risk   Focused Assessments Cardiac Assessment Handoff:  Cardiac Rhythm: Normal sinus rhythm No results found for: CKTOTAL, CKMB, CKMBINDEX, TROPONINI No results found for: DDIMER Does the Patient currently have chest pain? No   , Pulmonary Assessment Handoff:  Lung sounds:   O2 Device: Room Air        R Recommendations: See Admitting Provider Note  Report given to: [place] RN  Additional Notes:

## 2019-04-26 NOTE — ED Notes (Signed)
Pt declined to have anyone called right now

## 2019-04-26 NOTE — ED Notes (Signed)
I called to give an update to family member  Message left

## 2019-04-26 NOTE — ED Notes (Signed)
SDU  Breakfast ordered  

## 2019-04-26 NOTE — H&P (View-Only) (Signed)
[facility] Consult: 3:30 PM 04/26/2019  LOS: 0 days    Referring Provider: Dr Roger Shelter.    Primary Care Physician:  Forrest Moron, MD at [facility] [address] Serita Kyle. Primary Gastroenterologist: Althia Forts.    Reason for Consultation: Pancreatic mass.   HPI: Sylvia Cantrell is a 44 y.o. female.  PMH IBS.  Kidney stones.  UTIs.  Anemia.  Asthma.  Anxiety/depression..  Seizures.  Obesity.  Hypertension. 2012 D & E for fetal demise.  2019 uterine ablation of uterine fibroids associated with menorrhagia and anemia.  RUQ Abdominal ultrasound 2015 showed heterogeneous liver texture suggesting fatty infiltration or diffuse hepatocellular disease. No prior upper endoscopy or colonoscopy.  Pt has tired for a couple of days and anorexia to heavy, fatty foods for about a week.  No nausea.  Since the beginning of December patient has lost about 10 pounds unintentionally.  Beginning in the early morning of Wednesday, 1/13 patient woke up with fever to 102 degrees F and pain in her low back and lower abdomen.  She stuck it out for a few hours and it about 6 AM went to [facility] where she had blood work.  Other than glucose of 133, c-Met unremarkable.  Lactic acid was elevated at 2.1, WBCs 15.8.  Discharged home with prescription for Keflex which she took after.  Arriving back home around 5 PM.  She ate a small amount of rice and sausage to and went to bed.  Couple of hours later she woke up with recurrent pain and fever to 106.5.  At its worst the pain is 8 out of 10 but currently it has mostly subsided.  She called EMS and was transported to [facility] ED. 04/25/2019 CTAP with contrast: 2.6 cm mass in the tail of the pancreas.  No ductal dilation. 04/26/2019 MRI abdomen.  3.1 x 2.7 cystic lesion in the tail of the pancreas most likely mucinous  cystadenoma or mucinous cystic neoplasm.  Pseudopapillary tumor also a consideration.  Recommend endoscopic biopsy if possible.  2 small, sub-8 mm, cystic lesion in the head of the pancreas without worrisome features.  At arrival her temperature was 103.1.  Blood pressure 130s/70s.  Oxygen saturations trending low 90s percent.  Over the course of her stay in the ED her blood pressure has dropped into the 90s/50s - 60s but no tachycardia.   LFTs not elevated, albumin is low at 6.9. Hb 11.5 (was 14.4 -12.8 at [facility] yesterday).  Platelets 140.  WBCs 10.8.  Antibiotic coverage consists of Maxipime and  ABLATION    °• BREAST SURGERY    °• TONSILLECTOMY  1987  ° ° °Prior to Admission medications   °Medication Sig Start Date End Date Taking? Authorizing Provider  °albuterol (VENTOLIN HFA) 108 (90 Base) MCG/ACT inhaler Inhale 2 puffs into the lungs every 6 (six) hours as needed for wheezing or shortness of breath. 04/17/19  Yes Santiago, Irma M, MD  °amLODipine (NORVASC) 5 MG tablet Take 1 tablet (5 mg total) by mouth daily. 04/17/19  Yes Santiago, Irma M, MD  °cephALEXin (KEFLEX) 500 MG capsule Take 1 capsule (500 mg total) by mouth 2 (two) times daily. 04/25/19  Yes Stafford, Phillip, MD  °EPINEPHrine (EPIPEN) 0.3 mg/0.3 mL SOAJ injection Inject 0.3 mLs (0.3 mg total) into the muscle once. 03/12/13  Yes Hopper, David H, MD  °escitalopram (LEXAPRO) 5 MG tablet Take 1 tablet (5 mg total) by mouth daily. 04/17/19  Yes Santiago, Irma M, MD  °fluticasone (FLONASE) 50 MCG/ACT nasal spray Place 2 sprays  into both nostrils daily. °Patient taking differently: Place 2 sprays into both nostrils daily as needed for allergies.  10/27/17  Yes Stallings, Zoe A, MD  °ibuprofen (ADVIL,MOTRIN) 200 MG tablet Take 200 mg by mouth every 6 (six) hours as needed.   Yes [provider]  °LORazepam (ATIVAN) 0.5 MG tablet Take 1/2-1 tab PO BID prn °Patient taking differently: Take 0.25 mg by mouth 2 (two) times daily as needed for anxiety. Take 1/2-1 tab PO BID prn 09/12/18  Yes Corum, Lisa L, MD  °Fluticasone Furoate-Vilanterol (BREO ELLIPTA) 100-25 MCG/INH AEPB Inhale 1 puff into the lungs daily.     [provider]  ° ° °Scheduled Meds: °• enoxaparin (LOVENOX) injection  40 mg Subcutaneous Daily  °• escitalopram  5 mg Oral Daily  °• fluticasone  2 spray Each Nare Daily  °• fluticasone furoate-vilanterol  1 puff Inhalation Daily  ° °Infusions: °• 0.9 % NaCl with KCl 20 mEq / L 100 mL/hr at 04/26/19 0617  °• ceFEPime (MAXIPIME) IV Stopped (04/26/19 1442)  °• magnesium sulfate bolus IVPB Stopped (04/26/19 0845)  °• metronidazole 500 mg (04/26/19 1451)  °• sodium chloride    °• vancomycin Stopped (04/26/19 1337)  ° °PRN Meds: °acetaminophen **OR** acetaminophen, albuterol, LORazepam, ondansetron ° ° °Allergies as of 04/25/2019 - Review Complete 04/25/2019  °Allergen Reaction Noted  °• Amoxicillin  03/12/2013  °• Codeine    ° ° °Family History  °Problem Relation Age of Onset  °• Hypertension Mother   °• Hyperthyroidism Mother   °• Heart failure Father   °• Heart failure Maternal Grandmother   °• Hypertension Maternal Grandmother   °• Kidney disease Maternal Grandmother   °• Heart failure Paternal Grandfather   ° ° °Social History  ° °Socioeconomic History  °• Marital status: Legally Separated  °  Spouse name: Not on file  °• Number of children: Not on file  °• Years of education: Not on file  °• Highest education level: Not on file  °Occupational History  °• Not on file  °Tobacco Use  °• Smoking status: Never Smoker  °•  Smokeless tobacco: Never Used  °Substance and Sexual Activity  °• Alcohol use: No  °  Alcohol/week: 0.0 standard drinks  °• Drug use: No  °• Sexual activity: Never  °Other Topics Concern  °• Not on file  °Social History Narrative  °• Not on file  ° °Social Determinants of Health  ° °Financial Resource Strain:   °• Difficulty of Paying Living Expenses: Not   education level: Not on file  Occupational History   Not on file  Tobacco Use   Smoking status: Never Smoker    Smokeless tobacco: Never Used  Substance and Sexual Activity   Alcohol use: No    Alcohol/week: 0.0 standard drinks   Drug use: No   Sexual activity: Never  Other Topics Concern   Not on file  Social History Narrative   Not on file   Social Determinants of Health   Financial Resource Strain:    Difficulty of Paying Living Expenses: Not on file  Food Insecurity:    Worried About Bode in the Last Year: Not on file   Ran Out of Food in the Last Year: Not on file  Transportation Needs:    Lack of Transportation (Medical): Not on file   Lack of Transportation (Non-Medical): Not on file  Physical Activity:    Days of Exercise per Week: Not on file   Minutes of Exercise per Session: Not on file  Stress:    Feeling of Stress : Not on file  Social Connections:    Frequency of Communication with Friends and Family: Not on file   Frequency of Social Gatherings with Friends and Family: Not on file   Attends Religious Services: Not on file   Active Member of Clubs or Organizations: Not on file   Attends Archivist Meetings: Not on file   Marital Status: Not on file  Intimate Partner Violence:    Fear of Current or Ex-Partner: Not on file   Emotionally Abused: Not on file   Physically Abused: Not on file   Sexually Abused: Not on file    REVIEW OF SYSTEMS: Constitutional: Fatigue, weakness. ENT:  No nose bleeds Pulm: No shortness of breath.  No cough. CV:  No palpitations, no LE edema.  No angina GU:  No hematuria, no frequency.  No dysuria GI: See HPI.  Prior to recent days the patient has occasional IBS mostly diarrheal prone but occasional constipation.  No blood per rectum. Heme: Denies previous blood transfusions but did take oral iron for 2 years because of menorrhagia induced anemia.  Anemia has resolved following uterine ablation. Transfusions: None Neuro:  No headaches, no peripheral tingling or numbness.  Felt a bit dizzy  with the fever but no syncope. Derm:  No itching, no rash or sores.  Endocrine:  No sweats or chills.  No polyuria or dysuria Immunization: Reviewed.  She is up-to-date on her flu shot. Travel:  None beyond local counties in last few months.    PHYSICAL EXAM: Vital signs in last 24 hours: Vitals:   04/26/19 0745 04/26/19 1430  BP: 92/63 120/83  Pulse: 81 93  Resp: (!) 23 16  Temp:    SpO2: 97% 98%   Wt Readings from Last 3 Encounters:  04/25/19 111.1 kg  04/25/19 111.1 kg  04/17/19 113.6 kg    General: Obese, laying comfortably on the bed.  She is tired but does not look toxic or acutely ill Head: No facial asymmetry or swelling.  No signs of head trauma. Eyes: No scleral icterus.  No conjunctival pallor. Ears: Not hard of hearing Nose: No congestion or discharge Mouth: Good dentition.  Tongue midline.  Moist, clear, pink mucosa. Neck: No JVD, no masses, no thyromegaly. Lungs: No labored breathing, no cough.  Lungs clear bilaterally Heart: RRR.  No MRG.  S1, S2 present Abdomen: Soft.  Minimally tender in the upper abdomen but no guarding  or rebound.  Active bowel sounds.  No HSM, masses, bruits, hernias.   Rectal: Deferred Musc/Skeltl: No joint redness, swelling or gross deformity. Extremities: No CCE Neurologic: Alert.  Oriented x3.  Good historian.  Moves all 4 limbs.  No tremor or gross weakness.  Strength not tested. Skin: No sores or rashes.  Skin across the upper anterior trunk, sternal region is hyperemic. Nodes: No cervical adenopathy Psych: Cooperative, calm, pleasant.  Fluid speech.  Good historian.  Intake/Output from previous day: 01/13 0701 - 01/14 0700 In: 1000 [I.V.:1000] Out: -  Intake/Output this shift: No intake/output data recorded.  LAB RESULTS: Recent Labs    04/25/19 0808 04/25/19 2122 04/26/19 0508  WBC 15.8* 5.0 10.8*  HGB 14.4 12.8 11.5*  HCT 45.0 40.5 37.0  PLT 229 151 140*   BMET Lab Results  Component Value Date   NA 142  04/26/2019   NA 142 04/25/2019   NA 140 04/25/2019   K 3.4 (L) 04/26/2019   K 3.1 (L) 04/25/2019   K 3.5 04/25/2019   CL 112 (H) 04/26/2019   CL 111 04/25/2019   CL 107 04/25/2019   CO2 21 (L) 04/26/2019   CO2 22 04/25/2019   CO2 22 04/25/2019   GLUCOSE 155 (H) 04/26/2019   GLUCOSE 113 (H) 04/25/2019   GLUCOSE 133 (H) 04/25/2019   BUN 12 04/26/2019   BUN 12 04/25/2019   BUN 17 04/25/2019   CREATININE 0.93 04/26/2019   CREATININE 1.00 04/25/2019   CREATININE 0.78 04/25/2019   CALCIUM 8.2 (L) 04/26/2019   CALCIUM 8.3 (L) 04/25/2019   CALCIUM 9.5 04/25/2019   LFT Recent Labs    04/25/19 0808 04/25/19 2122 04/26/19 0508  PROT 7.8 6.0* 5.5*  ALBUMIN 4.2 3.3* 2.9*  AST _0 ALT 23 24 33  ALKPHOS 83 71 69  BILITOT 0.8 0.8 0.7   PT/INR Lab Results  Component Value Date   INR 1.1 04/25/2019   Hepatitis Panel No results for input(s): HEPBSAG, HCVAB, HEPAIGM, HEPBIGM in the last 72 hours. C-Diff No components found for: CDIFF Lipase     Component Value Date/Time   LIPASE 34 04/25/2019 2122    Drugs of Abuse  No results found for: LABOPIA, COCAINSCRNUR, LABBENZ, AMPHETMU, THCU, LABBARB   RADIOLOGY STUDIES: MR ABDOMEN W WO CONTRAST  Result Date: 04/26/2019 CLINICAL DATA:  Evaluate pancreatic lesions seen on recent CT scan. EXAM: MRI ABDOMEN WITHOUT AND WITH CONTRAST TECHNIQUE: Multiplanar multisequence MR imaging of the abdomen was performed both before and after the administration of intravenous contrast. CONTRAST:  74m GADAVIST GADOBUTROL 1 MMOL/ML IV SOLN COMPARISON:  CT scan 04/25/2019 FINDINGS: Lower chest: The lung bases are grossly clear. No pulmonary lesions, pleural or pericardial effusion. Hepatobiliary: No focal hepatic lesions or intrahepatic biliary dilatation. The gallbladder appears normal. The portal and hepatic veins are patent. No common bile duct dilatation. Pancreas: As demonstrated on the CT scan there is a 3.1 x 2.7 cm lesion in the pancreatic  tail. This appears fairly well encapsulated and appears largely cystic on the T2 weighted images with septations and faint nodularity. The lesion demonstrates subsequent internal contrast enhancement. Given the patient's age and this location AP mucinous cystic neoplasm or mucinous cystadenoma would be a strong consideration. A solid pseudopapillary tumor is also a consideration. There is a 6 mm exophytic cyst projecting off the pancreatic head (image 28/4. No contrast enhancement is demonstrated. 7 mm cystic lesion noted in the pancreatic head inferiorly on image 33/4. No contrast  tumor would also be a consideration in this age. Recommend endoscopic biopsy (if possible in this location). 2. Two small (sub 8 mm) cystic lesions in the pancreatic head without worrisome MR imaging features. These will require surveillance. 3. No findings for metastatic disease or adenopathy. Electronically Signed   By: P.  Gallerani M.D.   On: 04/26/2019 14:11  ° °CT Abdomen Pelvis W Contrast ° °Result Date: 04/25/2019 °CLINICAL DATA:  Left flank and left lower quadrant pain since early this morning. Hematuria. EXAM: CT ABDOMEN AND PELVIS WITH CONTRAST TECHNIQUE: Multidetector CT imaging of the abdomen and pelvis was performed using the standard protocol  following bolus administration of intravenous contrast. CONTRAST:  100mL OMNIPAQUE IOHEXOL 300 MG/ML  SOLN COMPARISON:  None. FINDINGS: Lower chest: No acute abnormality. Hepatobiliary: No focal liver abnormality is seen. No gallstones, gallbladder wall thickening, or biliary dilatation. Pancreas: There is a homogeneous mass in the pancreatic tail measuring 2.6 x 2.2 x 2.4 cm, average Hounsfield units of 43. No other pancreatic masses. No inflammation. Spleen: Normal in size without focal abnormality. Adrenals/Urinary Tract: No adrenal masses. Kidneys are normal in size, orientation and position with symmetric enhancement and excretion. No masses, stones or hydronephrosis. Normal ureters. Normal bladder. Stomach/Bowel: Normal stomach. Small bowel is normal in caliber it no inflammation colon is normal caliber. There are scattered colonic diverticula, mostly on the left. No diverticulitis, wall thickening or other inflammatory process. Normal appendix visualized. Vascular/Lymphatic: No significant vascular findings are present. No enlarged abdominal or pelvic lymph nodes. Reproductive: Uterus and bilateral adnexa are unremarkable. Other: No abdominal wall hernia or abnormality. No abdominopelvic ascites. Musculoskeletal: No acute or significant osseous findings. IMPRESSION: 1. No acute findings. No renal or ureteral stones or obstructive uropathy. No findings to account for the patient's left-sided pain and hematuria. 2. 2.6 cm pancreatic mass. Recommend further evaluation and characterization with pancreatic MRI without and with contrast. 3. Colonic diverticulosis without evidence of diverticulitis. Electronically Signed   By: David  Ormond M.D.   On: 04/25/2019 12:57  ° °DG Chest Port 1 View ° °Result Date: 04/25/2019 °CLINICAL DATA:  43-year-old female with fever EXAM: PORTABLE CHEST 1 VIEW COMPARISON:  Chest radiograph dated 06/21/2017 FINDINGS: Left lung base nodular densities concerning for developing  infiltrate, possibly viral or atypical. Clinical correlation is recommended. No focal consolidation, pleural effusion or pneumothorax. Stable cardiomediastinal silhouette. No acute osseous pathology. IMPRESSION: Findings concerning for developing infiltrate at the left lung base. Clinical correlation is recommended. Electronically Signed   By: Arash  Radparvar M.D.   On: 04/25/2019 21:47  ° ° ° °IMPRESSION:  ° °*   Cystic lesion in tail of pancreas, smaller cystic lesions at the head of the pancreas °Not clear that this is going to be amenable to EUS, FNA due to its location. ° °*   Fevers, leukocytosis, ? if related to findings in the pancreas?. ° °*    Mild thrombocytopenia. ° °*    Mild anemia, MCV normal.  Hx menorrhagia associated iron deficiency anemia resolved after uterine ablation. ° °*   IBS, diarrhea predominant.  Longstanding.  No previous endoscopic work-up for this. ° ° °PLAN:  °  ° °*  Per Dr Pyrtle.   ° °*   Pt received Lovenox at 1120 this AM, placed it on hold w next dose set for Sat 10 AM and made her npo after midnight in case EUS is pursued but need to reach out to Dr Jacobs re EUS.   ° ° °Sarah Gribbin    Mild anemia, MCV normal.  Hx menorrhagia associated iron deficiency anemia resolved after uterine ablation.  *   IBS, diarrhea predominant.  Longstanding.  No previous endoscopic work-up for this.   PLAN:     *  Per Dr Hilarie Fredrickson.    *   Pt received Lovenox at 1120 this AM, placed it on hold w next dose set for Sat 10 AM and made her npo after midnight in case EUS is pursued but need to reach out to Dr Ardis Hughs re EUS.     Azucena Freed  04/26/2019, 3:30 PM Phone [phone]

## 2019-04-26 NOTE — ED Notes (Signed)
Pt voided, urine spilt so unable to measure. Estimated greater than 500 ml void

## 2019-04-26 NOTE — ED Notes (Signed)
Lunch Tray Ordered @ 1822.  

## 2019-04-26 NOTE — ED Notes (Signed)
ED TO INPATIENT HANDOFF REPORT  ED Nurse Name and Phone #: [phone]  S Name/Age/Gender Sylvia Cantrell 44 y.o. female Room/Bed: RESUSC/RESUSC  Code Status   Code Status: Full Code  Home/SNF/Other Home Patient oriented to: self, place, time and situation Is this baseline? Yes   Triage Complete: Triage complete  Chief Complaint Sepsis due to undetermined organism [facility]) [A41.9]  Triage Note The pt has had a temp since 0230 this am  She was seen at [place] this am and diagnosed with a uti  This evening the pt has been vomiting and her temp will not stay down  lmp 2 months ago irregular periods    Allergies Allergies  Allergen Reactions  . Amoxicillin     Hives with first dose  . Codeine     REACTION: itching    Level of Care/Admitting Diagnosis ED Disposition    ED Disposition Condition [facility] Area: Scalp Level [100100]  Level of Care: Progressive [102]  Admit to Progressive based on following criteria: MULTISYSTEM THREATS such as stable sepsis, metabolic/electrolyte imbalance with or without encephalopathy that is responding to early treatment.  Admit to Progressive based on following criteria: RESPIRATORY PROBLEMS hypoxemic/hypercapnic respiratory failure that is responsive to NIPPV (BiPAP) or High Flow Nasal Cannula (6-80 lpm). Frequent assessment/intervention, no > Q2 hrs < Q4 hrs, to maintain oxygenation and pulmonary hygiene.  Covid Evaluation: Confirmed COVID Negative  Diagnosis: Sepsis due to undetermined organism [facility]) [[id number]]  Admitting Physician: Reubin Milan [[id number]]  Attending Physician: Reubin Milan [[id number]]  Estimated length of stay: past midnight tomorrow  Certification:: I certify this patient will need inpatient services for at least 2 midnights       B Medical/Surgery History Past Medical History:  Diagnosis Date  . Allergy   . Anemia   . Anemia   . Anxiety   . Asthma   . Chronic  headache   . Depression   . Hypertension   . IBS (irritable bowel syndrome)   . Kidney stones   . Nephrolithiasis   . Obesity   . Seizures ([place])    Past Surgical History:  Procedure Laterality Date  . ABLATION    . BREAST SURGERY    . TONSILLECTOMY  1987     A IV Location/Drains/Wounds Patient Lines/Drains/Airways Status   Active Line/Drains/Airways    Name:   Placement date:   Placement time:   Site:   Days:   Peripheral IV 04/25/19 Right Antecubital   04/25/19    2000    Antecubital   1   Peripheral IV 04/26/19 Right;Anterior Forearm   04/26/19    0639    Forearm   less than 1          Intake/Output Last 24 hours  Intake/Output Summary (Last 24 hours) at 04/26/2019 2725 Last data filed at 04/26/2019 0036 Gross per 24 hour  Intake 1000 ml  Output --  Net 1000 ml    Labs/Imaging Results for orders placed or performed during the hospital encounter of 04/25/19 (from the past 48 hour(s))  Lactic acid, plasma     Status: None   Collection Time: 04/25/19  8:56 PM  Result Value Ref Range   Lactic Acid, Venous 1.8 0.5 - 1.9 mmol/L    Comment: Performed at [facility], [address], [place], [place] [address]  Comprehensive metabolic panel     Status: Abnormal   Collection Time: 04/25/19  9:22 PM  Result  Value Ref Range   Sodium 142 135 - 145 mmol/L   Potassium 3.1 (L) 3.5 - 5.1 mmol/L   Chloride 111 98 - 111 mmol/L   CO2 22 22 - 32 mmol/L   Glucose, Bld 113 (H) 70 - 99 mg/dL   BUN 12 6 - 20 mg/dL   Creatinine, Ser 6.14 0.44 - 1.00 mg/dL   Calcium 8.3 (L) 8.9 - 10.3 mg/dL   Total Protein 6.0 (L) 6.5 - 8.1 g/dL   Albumin 3.3 (L) 3.5 - 5.0 g/dL   AST 25 15 - 41 U/L   ALT 24 0 - 44 U/L   Alkaline Phosphatase 71 38 - 126 U/L   Total Bilirubin 0.8 0.3 - 1.2 mg/dL   GFR calc non Af Amer >60 >60 mL/min   GFR calc Af Amer >60 >60 mL/min   Anion gap 9 5 - 15    Comment: Performed at [facility], [address] [place], [place] [address]  CBC WITH  DIFFERENTIAL     Status: Abnormal   Collection Time: 04/25/19  9:22 PM  Result Value Ref Range   WBC 5.0 4.0 - 10.5 K/uL   RBC 4.64 3.87 - 5.11 MIL/uL   Hemoglobin 12.8 12.0 - 15.0 g/dL   HCT 00.8 67.6 - 19.5 %   MCV 87.3 80.0 - 100.0 fL   MCH 27.6 26.0 - 34.0 pg   MCHC 31.6 30.0 - 36.0 g/dL   RDW 09.3 26.7 - 12.4 %   Platelets 151 150 - 400 K/uL   nRBC 0.0 0.0 - 0.2 %   Neutrophils Relative % 78 %   Neutro Abs 3.8 1.7 - 7.7 K/uL   Lymphocytes Relative 21 %   Lymphs Abs 1.0 0.7 - 4.0 K/uL   Monocytes Relative 1 %   Monocytes Absolute 0.0 (L) 0.1 - 1.0 K/uL   Eosinophils Relative 0 %   Eosinophils Absolute 0.0 0.0 - 0.5 K/uL   Basophils Relative 0 %   Basophils Absolute 0.0 0.0 - 0.1 K/uL   Immature Granulocytes 0 %   Abs Immature Granulocytes 0.02 0.00 - 0.07 K/uL    Comment: Performed at [facility], [address], [place], [place] [address]  APTT     Status: None   Collection Time: 04/25/19  9:22 PM  Result Value Ref Range   aPTT 29 24 - 36 seconds    Comment: Performed at [facility], [address], [place], [place] [address]  Protime-INR     Status: None   Collection Time: 04/25/19  9:22 PM  Result Value Ref Range   Prothrombin Time 13.6 11.4 - 15.2 seconds   INR 1.1 0.8 - 1.2    Comment: (NOTE) INR goal varies based on device and disease states. Performed at [facility], [address] [place], [place] [address]   Lipase, blood     Status: None   Collection Time: 04/25/19  9:22 PM  Result Value Ref Range   Lipase 34 11 - 51 U/L    Comment: Performed at [facility], [address] [place], [place] [address]  Magnesium     Status: None   Collection Time: 04/25/19  9:22 PM  Result Value Ref Range   Magnesium 1.7 1.7 - 2.4 mg/dL    Comment: Performed at [facility], [address], [place], [place] [address]  Phosphorus     Status: Abnormal   Collection Time: 04/25/19  9:22 PM  Result Value Ref Range   Phosphorus <1.0 (LL)  2.5 - 4.6 mg/dL    Comment: CRITICAL RESULT CALLED TO, READ BACK BY AND VERIFIED WITH: CHRISCO C,RN 04/26/19 0136 WAYK Performed at [facility], [address] [place], [place] [address]   Blood Culture (routine x 2)     Status: None (Preliminary result)   Collection Time: 04/25/19  9:45 PM   Specimen: BLOOD  Result Value Ref Range   Specimen Description BLOOD LEFT HAND    Special Requests      BOTTLES DRAWN AEROBIC AND ANAEROBIC Blood Culture adequate volume   Culture      NO GROWTH < 12 HOURS Performed at [facility], [address] [place], [place] [address]    Report Status PENDING   Blood Culture (routine x 2)     Status: None (Preliminary result)   Collection Time: 04/25/19  9:49 PM   Specimen: BLOOD  Result Value Ref Range   Specimen Description BLOOD RIGHT HAND    Special Requests      BOTTLES DRAWN AEROBIC AND ANAEROBIC Blood Culture adequate volume   Culture      NO GROWTH < 12 HOURS Performed at [facility], [address] [place], [place] [address]    Report Status PENDING   Respiratory Panel by RT PCR (Flu A&B, Covid) - Nasopharyngeal Swab     Status: None   Collection Time: 04/25/19 10:40 PM   Specimen: Nasopharyngeal Swab  Result Value Ref Range   SARS Coronavirus 2 by RT PCR NEGATIVE NEGATIVE    Comment: (NOTE) SARS-CoV-2 target nucleic acids are NOT DETECTED. The SARS-CoV-2 RNA is generally detectable in upper respiratoy specimens during the acute phase of infection. The lowest concentration of SARS-CoV-2 viral copies this assay can detect is 131 copies/mL. A negative result does not preclude SARS-Cov-2 infection and should not be used as the sole basis for treatment or other patient management decisions. A negative result may occur with  improper specimen collection/handling, submission of specimen other than nasopharyngeal swab, presence of viral mutation(s) within the areas targeted by this assay, and inadequate number of viral  copies (<131 copies/mL). A negative result must be combined with clinical observations, patient history, and epidemiological information. The expected result is Negative. Fact Sheet for Patients:  https://www.moore.com/https://www.fda.gov/media/142436/download Fact Sheet for Healthcare Providers:  https://www.young.biz/https://www.fda.gov/media/142435/download This test is not yet ap proved or cleared by the [place] FDA and  has been authorized for detection and/or diagnosis of SARS-CoV-2 by FDA under an Emergency Use Authorization (EUA). This EUA will remain  in effect (meaning this test can be used) for the duration of the COVID-19 declaration under Section 564(b)(1) of the Act, 21 U.S.C. section 360bbb-3(b)(1), unless the authorization is terminated or revoked sooner.    Influenza A by PCR NEGATIVE NEGATIVE   Influenza B by PCR NEGATIVE NEGATIVE    Comment: (NOTE) The Xpert Xpress SARS-CoV-2/FLU/RSV assay is intended as an aid in  the diagnosis of influenza from Nasopharyngeal swab specimens and  should not be used as a sole basis for treatment. Nasal washings and  aspirates are unacceptable for Xpert Xpress SARS-CoV-2/FLU/RSV  testing. Fact Sheet for Patients: https://www.moore.com/https://www.fda.gov/media/142436/download Fact Sheet for Healthcare Providers: https://www.young.biz/https://www.fda.gov/media/142435/download This test is not yet approved or cleared by the [place] FDA and  has been authorized for detection and/or diagnosis of SARS-CoV-2 by  FDA under an Emergency Use Authorization (EUA). This EUA will remain  in effect (meaning  this test can be used) for the duration of the  Covid-19 declaration under Section 564(b)(1) of the Act, 21  U.S.C. section 360bbb-3(b)(1), unless the authorization is  terminated or revoked. Performed at [facility], [address], [place], [place] [address]   Urinalysis, Routine w reflex microscopic     Status: Abnormal   Collection Time: 04/26/19 12:38 AM  Result Value Ref Range   Color, Urine  YELLOW YELLOW   APPearance CLEAR CLEAR   Specific Gravity, Urine 1.016 1.005 - 1.030   pH 5.0 5.0 - 8.0   Glucose, UA NEGATIVE NEGATIVE mg/dL   Hgb urine dipstick MODERATE (A) NEGATIVE   Bilirubin Urine NEGATIVE NEGATIVE   Ketones, ur NEGATIVE NEGATIVE mg/dL   Protein, ur NEGATIVE NEGATIVE mg/dL   Nitrite NEGATIVE NEGATIVE   Leukocytes,Ua NEGATIVE NEGATIVE   RBC / HPF 0-5 0 - 5 RBC/hpf   WBC, UA 0-5 0 - 5 WBC/hpf   Bacteria, UA NONE SEEN NONE SEEN   Squamous Epithelial / LPF 0-5 0 - 5   Mucus PRESENT     Comment: Performed at [facility], [address], [place], [place] [address]  Lactic acid, plasma     Status: None   Collection Time: 04/26/19  5:08 AM  Result Value Ref Range   Lactic Acid, Venous 1.1 0.5 - 1.9 mmol/L    Comment: Performed at [facility], [address], [place], [place] [address]  CBC WITH DIFFERENTIAL     Status: Abnormal   Collection Time: 04/26/19  5:08 AM  Result Value Ref Range   WBC 10.8 (H) 4.0 - 10.5 K/uL   RBC 4.24 3.87 - 5.11 MIL/uL   Hemoglobin 11.5 (L) 12.0 - 15.0 g/dL   HCT 13.0 86.5 - 78.4 %   MCV 87.3 80.0 - 100.0 fL   MCH 27.1 26.0 - 34.0 pg   MCHC 31.1 30.0 - 36.0 g/dL   RDW 69.6 29.5 - 28.4 %   Platelets 140 (L) 150 - 400 K/uL   nRBC 0.0 0.0 - 0.2 %   Neutrophils Relative % 92 %   Neutro Abs 9.9 (H) 1.7 - 7.7 K/uL   Lymphocytes Relative 5 %   Lymphs Abs 0.6 (L) 0.7 - 4.0 K/uL   Monocytes Relative 2 %   Monocytes Absolute 0.2 0.1 - 1.0 K/uL   Eosinophils Relative 0 %   Eosinophils Absolute 0.0 0.0 - 0.5 K/uL   Basophils Relative 0 %   Basophils Absolute 0.0 0.0 - 0.1 K/uL   Immature Granulocytes 1 %   Abs Immature Granulocytes 0.05 0.00 - 0.07 K/uL    Comment: Performed at [facility], [address] [place], [place] [address]  Comprehensive metabolic panel     Status: Abnormal   Collection Time: 04/26/19  5:08 AM  Result Value Ref Range   Sodium 142 135 - 145 mmol/L   Potassium 3.4 (L) 3.5 - 5.1 mmol/L    Chloride 112 (H) 98 - 111 mmol/L   CO2 21 (L) 22 - 32 mmol/L   Glucose, Bld 155 (H) 70 - 99 mg/dL   BUN 12 6 - 20 mg/dL   Creatinine, Ser 0.10 0.44 - 1.00 mg/dL   Calcium 8.2 (L) 8.9 - 10.3 mg/dL   Total Protein 5.5 (L) 6.5 - 8.1 g/dL   Albumin 2.9 (L) 3.5 - 5.0 g/dL   AST 29 15 - 41 U/L   ALT 33 0 - 44 U/L  Alkaline Phosphatase 69 38 - 126 U/L   Total Bilirubin 0.7 0.3 - 1.2 mg/dL   GFR calc non Af Amer >60 >60 mL/min   GFR calc Af Amer >60 >60 mL/min   Anion gap 9 5 - 15    Comment: Performed at [facility], [address] [place], [place] [address]  VITAMIN D 25 Hydroxy (Vit-D Deficiency, Fractures)     Status: Abnormal   Collection Time: 04/26/19  5:08 AM  Result Value Ref Range   Vit D, 25-Hydroxy 13.77 (L) 30 - 100 ng/mL    Comment: (NOTE) Vitamin D deficiency has been defined by the Institute of Medicine  and an Endocrine Society practice guideline as a level of serum 25-OH  vitamin D less than 20 ng/mL (1,2). The Endocrine Society went on to  further define vitamin D insufficiency as a level between 21 and 29  ng/mL (2). 1. IOM (Institute of Medicine). 2010. Dietary reference intakes for  calcium and D. [place]: [employer]. 2. Holick MF, Binkley Cusseta, Bischoff-Ferrari HA, et al. Evaluation,  treatment, and prevention of vitamin D deficiency: an Endocrine  Society clinical practice guideline, JCEM. 2011 Jul; 96(7): 1911-30. Performed at [facility], [address], [place], [place] [address]   Procalcitonin     Status: None   Collection Time: 04/26/19  5:08 AM  Result Value Ref Range   Procalcitonin 54.63 ng/mL    Comment:        Interpretation: PCT >= 10 ng/mL: Important systemic inflammatory response, almost exclusively due to severe bacterial sepsis or septic shock. (NOTE)       Sepsis PCT Algorithm           Lower Respiratory Tract                                      Infection PCT Algorithm     ----------------------------     ----------------------------         PCT < 0.25 ng/mL                PCT < 0.10 ng/mL         Strongly encourage             Strongly discourage   discontinuation of antibiotics    initiation of antibiotics    ----------------------------     -----------------------------       PCT 0.25 - 0.50 ng/mL            PCT 0.10 - 0.25 ng/mL               OR       >80% decrease in PCT            Discourage initiation of                                            antibiotics      Encourage discontinuation           of antibiotics    ----------------------------     -----------------------------         PCT >= 0.50 ng/mL              PCT 0.26 - 0.50 ng/mL  AND       <80% decrease in PCT             Encourage initiation of                                             antibiotics       Encourage continuation           of antibiotics    ----------------------------     -----------------------------        PCT >= 0.50 ng/mL                  PCT > 0.50 ng/mL               AND         increase in PCT                  Strongly encourage                                      initiation of antibiotics    Strongly encourage escalation           of antibiotics                                     -----------------------------                                           PCT <= 0.25 ng/mL                                                 OR                                        > 80% decrease in PCT                                     Discontinue / Do not initiate                                             antibiotics Performed at [facility], [address] [place], [place] [address]   I-Stat beta hCG blood, ED     Status: None   Collection Time: 04/26/19  5:14 AM  Result Value Ref Range   I-stat hCG, quantitative <5.0 <5 mIU/mL   Comment 3            Comment:   GEST. AGE      CONC.  (mIU/mL)   <=1 WEEK        5 - 50     2 WEEKS       50 -  500     3  WEEKS       100 - 10,000     4 WEEKS     1,000 - 30,000        FEMALE AND NON-PREGNANT FEMALE:     LESS THAN 5 mIU/mL    CT Abdomen Pelvis W Contrast  Result Date: 04/25/2019 CLINICAL DATA:  Left flank and left lower quadrant pain since early this morning. Hematuria. EXAM: CT ABDOMEN AND PELVIS WITH CONTRAST TECHNIQUE: Multidetector CT imaging of the abdomen and pelvis was performed using the standard protocol following bolus administration of intravenous contrast. CONTRAST:  OMNIPAQUE IOHEXOL 300 MG/ML  SOLN COMPARISON:  None. FINDINGS: Lower chest: No acute abnormality. Hepatobiliary: No focal liver abnormality is seen. No gallstones, gallbladder wall thickening, or biliary dilatation. Pancreas: There is a homogeneous mass in the pancreatic tail measuring 2.6 x 2.2 x 2.4 cm, average Hounsfield units of 43. No other pancreatic masses. No inflammation. Spleen: Normal in size without focal abnormality. Adrenals/Urinary Tract: No adrenal masses. Kidneys are normal in size, orientation and position with symmetric enhancement and excretion. No masses, stones or hydronephrosis. Normal ureters. Normal bladder. Stomach/Bowel: Normal stomach. Small bowel is normal in caliber it no inflammation colon is normal caliber. There are scattered colonic diverticula, mostly on the left. No diverticulitis, wall thickening or other inflammatory process. Normal appendix visualized. Vascular/Lymphatic: No significant vascular findings are present. No enlarged abdominal or pelvic lymph nodes. Reproductive: Uterus and bilateral adnexa are unremarkable. Other: No abdominal wall hernia or abnormality. No abdominopelvic ascites. Musculoskeletal: No acute or significant osseous findings. IMPRESSION: 1. No acute findings. No renal or ureteral stones or obstructive uropathy. No findings to account for the patient's left-sided pain and hematuria. 2. 2.6 cm pancreatic mass. Recommend further evaluation and characterization with  pancreatic MRI without and with contrast. 3. Colonic diverticulosis without evidence of diverticulitis. Electronically Signed   By: Amie Portland M.D.   On: 04/25/2019 12:57   DG Chest Port 1 View  Result Date: 04/25/2019 CLINICAL DATA:  44 year old female with fever EXAM: PORTABLE CHEST 1 VIEW COMPARISON:  Chest radiograph dated 06/21/2017 FINDINGS: Left lung base nodular densities concerning for developing infiltrate, possibly viral or atypical. Clinical correlation is recommended. No focal consolidation, pleural effusion or pneumothorax. Stable cardiomediastinal silhouette. No acute osseous pathology. IMPRESSION: Findings concerning for developing infiltrate at the left lung base. Clinical correlation is recommended. Electronically Signed   By: Elgie Collard M.D.   On: 04/25/2019 21:47    Pending Labs Unresulted Labs (From admission, onward)    Start     Ordered   04/27/19 0500  HIV Antibody (routine testing w rflx)  (HIV Antibody (Routine testing w reflex) panel)  Tomorrow morning,   R     04/26/19 0128   04/25/19 2122  Urine culture  ONCE - STAT,   STAT     04/25/19 2123          Vitals/Pain Today's Vitals   04/26/19 0615 04/26/19 0700 04/26/19 0706 04/26/19 0745  BP: 92/60 96/67  92/63  Pulse: 80 81  81  Resp: 18 18  (!) 23  Temp:   98.4 F (36.9 C)   TempSrc:   Oral   SpO2: 94% 97%  97%  Weight:      Height:      PainSc:        Isolation Precautions No active isolations  Medications Medications  ceFEPIme (MAXIPIME) 2 g in sodium chloride 0.9 % 100 mL IVPB (  0 g Intravenous Stopped 04/26/19 0701)  vancomycin (VANCOCIN) 2,500 mg in sodium chloride 0.9 % 500 mL IVPB (0 mg Intravenous Stopped 04/26/19 0155)    Followed by  vancomycin (VANCOCIN) IVPB 1000 mg/200 mL premix (has no administration in time range)  0.9 % NaCl with KCl 40 mEq / L  infusion (0 mL/hr Intravenous Stopped 04/26/19 0617)  enoxaparin (LOVENOX) injection 40 mg (has no administration in time range)   acetaminophen (TYLENOL) tablet 650 mg (has no administration in time range)    Or  acetaminophen (TYLENOL) suppository 650 mg (has no administration in time range)  0.9 % NaCl with KCl 20 mEq/ L  infusion ( Intravenous New Bag/Given 04/26/19 0617)  albuterol (VENTOLIN HFA) 108 (90 Base) MCG/ACT inhaler 2 puff (has no administration in time range)  escitalopram (LEXAPRO) tablet 5 mg (has no administration in time range)  fluticasone (FLONASE) 50 MCG/ACT nasal spray 2 spray (has no administration in time range)  fluticasone furoate-vilanterol (BREO ELLIPTA) 100-25 MCG/INH 1 puff (has no administration in time range)  LORazepam (ATIVAN) tablet 1 mg (has no administration in time range)  sodium phosphate 30 mmol in dextrose 5 % 250 mL infusion (30 mmol Intravenous New Bag/Given 04/26/19 0439)  magnesium sulfate IVPB 2 g 50 mL (0 g Intravenous Stopped 04/26/19 0845)  metroNIDAZOLE (FLAGYL) IVPB 500 mg (0 mg Intravenous Stopped 04/26/19 0810)  metroNIDAZOLE (FLAGYL) IVPB 500 mg (0 mg Intravenous Stopped 04/25/19 2331)  sodium chloride 0.9 % bolus 1,000 mL (0 mLs Intravenous Stopped 04/26/19 0036)  ondansetron (ZOFRAN) injection 4 mg (4 mg Intravenous Given 04/25/19 2236)  ketorolac (TORADOL) 30 MG/ML injection 30 mg (30 mg Intravenous Given 04/26/19 0035)  ondansetron (ZOFRAN) injection 4 mg (4 mg Intravenous Given 04/26/19 0035)    Mobility walks Low fall risk   Focused Assessments    R Recommendations: See Admitting Provider Note  Report given to:   Additional Notes:

## 2019-04-26 NOTE — Progress Notes (Signed)
PROGRESS NOTE    Patient: Sylvia Cantrell                            PCP: Doristine Bosworth, MD                    DOB: [birth year]            DOA: 04/25/2019 KDX:[id number]             DOS: 04/26/2019, 2:20 PM   LOS: 0 days   Date of Service: The patient was seen and examined on 04/26/2019  Subjective:   The patient was seen and examined, remained stable, complaining of nausea vomiting Denies any chest pain.  Denies any shortness of breath.  Brief Narrative:   Kumiko Fishman is a 44 y.o. female with medical history significant of allergy, anemia, anxiety, asthma, chronic headache, depression, hypertension, IBS, kidney stones, nephrolithiasis, obesity, seizures who has been having a fever since around 0230 on 04/25/2019.  She was seen at [facility], diagnosed with a UTI, prescribed Keflex and discharged home. Presented again at [facility] with nausea, vomiting after she has taken the medication.  Also complaining of headache low back pain urinary frequency.  ED evaluation: T-max 103.1, pulse 125, RR 22, blood pressure 131/72, satting 99% on room air, Briefly hypotensive 89/57, responded to IV fluid resuscitation WBC within normal limits, lactic acid 1.8, potassium 3.1, SARS-CoV-2 negative Influenza a/B PCR negative  -Initiated IV antibiotics of cefepime and vancomycin and metronidazole    Assessment & Plan:   Principal Problem:   Sepsis due to undetermined organism [facility]) Active Problems:   Hypophosphatemia   Hypokalemia   Pancreatic mass    Sepsis due to undetermined organism (HCC) -Continue to monitor closely to monitor bed -T-max 103.1, WBC 18.8 >> 10.8, lactic acid 1.8, >> 1.1, -Remains mildly hypotensive blood pressure 92/63, RR 23, temp 98.4 -Continue aggressive IV fluid resuscitation -We will follow with labs,  - Blood and urine cultures -Was initiated on broad-spectrum of cefepime/Flagyl/vancomycin Will continue antibiotics for today,  -Anticipating to narrow down  antibiotics in next 24-48 hours  .  Active Problems:   Hypophosphatemia Continue IV replacement. Follow-up phosphorus level as needed. Check vitamin D level.    Hypokalemia Replacing. Follow potassium level. Supplement magnesium.    Pancreatic mass -Based on imaging finding MRI: 3.1 x 2.7 cm cystic appearing lesion in the pancreatic tail,  At this time it appears  mucinous cystadenoma.  -Curb sided oncologist Dr. Pamelia Hoit -who recommended GI consultation for possible endoscopic ultrasound for further evaluation -We will recheck to GI for further evaluation    Depression Continue Lexapro.    DVT prophylaxis: Lovenox SQ. Code Status: Full code. Family Communication: Disposition Plan: Admit to progressive unit for sepsis treatment. Consults called: Admission status: Inpatient/progressive unit.  EXAM: MRI ABDOMEN WITHOUT AND WITH CONTRAST IMPRESSION: 1. 3.1 x 2.7 cm cystic appearing lesion in the pancreatic tail, most likely a mucinous cystadenoma or mucinous cystic neoplasm. Solid pseudopapillary tumor would also be a consideration in this age. Recommend endoscopic biopsy (if possible in this location). 2. Two small (sub 8 mm) cystic lesions in the pancreatic head without worrisome MR imaging features. These will require surveillance. 3. No findings for metastatic disease or adenopathy.    Procedures:   No admission procedures for hospital encounter.   Antimicrobials:  Anti-infectives (From admission, onward)   Start     Dose/Rate Route Frequency Ordered Stop  04/26/19 1030  vancomycin (VANCOCIN) IVPB 1000 mg/200 mL premix     1,000 mg 200 mL/hr over 60 Minutes Intravenous Every 12 hours 04/25/19 2137     04/26/19 0600  metroNIDAZOLE (FLAGYL) IVPB 500 mg     500 mg 100 mL/hr over 60 Minutes Intravenous Every 8 hours 04/26/19 0310     04/25/19 2200  ceFEPIme (MAXIPIME) 2 g in sodium chloride 0.9 % 100 mL IVPB     2 g 200 mL/hr over 30 Minutes  Intravenous Every 8 hours 04/25/19 2137     04/25/19 2145  vancomycin (VANCOCIN) 2,500 mg in sodium chloride 0.9 % 500 mL IVPB     2,500 mg 250 mL/hr over 120 Minutes Intravenous  Once 04/25/19 2137 04/26/19 0155   04/25/19 2130  aztreonam (AZACTAM) 2 g in sodium chloride 0.9 % 100 mL IVPB  Status:  Discontinued     2 g 200 mL/hr over 30 Minutes Intravenous  Once 04/25/19 2123 04/25/19 2128   04/25/19 2130  metroNIDAZOLE (FLAGYL) IVPB 500 mg     500 mg 100 mL/hr over 60 Minutes Intravenous  Once 04/25/19 2123 04/25/19 2331   04/25/19 2130  vancomycin (VANCOCIN) IVPB 1000 mg/200 mL premix  Status:  Discontinued     1,000 mg 200 mL/hr over 60 Minutes Intravenous  Once 04/25/19 2123 04/25/19 2137       Medication:  . enoxaparin (LOVENOX) injection  40 mg Subcutaneous Daily  . escitalopram  5 mg Oral Daily  . fluticasone  2 spray Each Nare Daily  . fluticasone furoate-vilanterol  1 puff Inhalation Daily    acetaminophen **OR** acetaminophen, albuterol, LORazepam, ondansetron   Objective:   Vitals:   04/26/19 0615 04/26/19 0700 04/26/19 0706 04/26/19 0745  BP: 92/60 96/67  92/63  Pulse: 80 81  81  Resp: 18 18  (!) 23  Temp:   98.4 F (36.9 C)   TempSrc:   Oral   SpO2: 94% 97%  97%  Weight:      Height:        Intake/Output Summary (Last 24 hours) at 04/26/2019 1420 Last data filed at 04/26/2019 0036 Gross per 24 hour  Intake 1000 ml  Output -  Net 1000 ml   Filed Weights   04/25/19 2105  Weight: 111.1 kg     Examination:   Physical Exam  Constitution:  Alert, cooperative, no distress,  Appears calm and comfortable  Psychiatric: Normal and stable mood and affect, cognition intact,   HEENT: Normocephalic, PERRL, otherwise with in Normal limits  Chest:Chest symmetric Cardio vascular:  S1/S2, RRR, No murmure, No Rubs or Gallops  pulmonary: Clear to auscultation bilaterally, respirations unlabored, negative wheezes / crackles Abdomen: Lower abdomen, suprapubic  tenderness, soft, non-distended, bowel sounds,no masses, no organomegaly Muscular skeletal:   negative CVA tenderness Limited exam - in bed, able to move all 4 extremities, Normal strength,  Neuro: CNII-XII intact. , normal motor and sensation, reflexes intact  Extremities: No pitting edema lower extremities, +2 pulses  Skin: Dry, warm to touch, negative for any Rashes, No open wounds Wounds: per nursing documentation    LABs:  CBC Latest Ref Rng & Units 04/26/2019 04/25/2019 04/25/2019  WBC 4.0 - 10.5 K/uL 10.8(H) 5.0 15.8(H)  Hemoglobin 12.0 - 15.0 g/dL 11.5(L) 12.8 14.4  Hematocrit 36.0 - 46.0 % 37.0 40.5 45.0  Platelets 150 - 400 K/uL 140(L) 151 229   CMP Latest Ref Rng & Units 04/26/2019 04/25/2019 04/25/2019  Glucose 70 - 99 mg/dL 570(V)  113(H) 133(H)  BUN 6 - 20 mg/dL 12 12 17   Creatinine 0.44 - 1.00 mg/dL 0.93 1.00 0.78  Sodium 135 - 145 mmol/L 142 142 140  Potassium 3.5 - 5.1 mmol/L 3.4(L) 3.1(L) 3.5  Chloride 98 - 111 mmol/L 112(H) 111 107  CO2 22 - 32 mmol/L 21(L) 22 22  Calcium 8.9 - 10.3 mg/dL 8.2(L) 8.3(L) 9.5  Total Protein 6.5 - 8.1 g/dL 5.5(L) 6.0(L) 7.8  Total Bilirubin 0.3 - 1.2 mg/dL 0.7 0.8 0.8  Alkaline Phos 38 - 126 U/L 69 71 83  AST 15 - 41 U/L 29 25 22   ALT 0 - 44 U/L 33 24 23    SIGNED: Deatra James, MD, FACP, FHM. [facility],  Pager [phone]  If 7PM-7AM, please contact night-coverage [email or web address] Ota [facility] 04/26/2019, 2:20 PM

## 2019-04-26 NOTE — Consult Note (Addendum)
° °                                                                          [facility] Consult: °3:30 PM °04/26/2019 ° LOS: 0 days  ° ° °Referring Provider: Dr Shahmehdi.    °Primary Care Physician:  Stallings, Zoe A, MD at [address] [facility]. °Primary Gastroenterologist: Unassigned. ° ° ° °Reason for Consultation: Pancreatic mass. °  °HPI: Sylvia Cantrell is a 44 y.o. female.  PMH IBS.  Kidney stones.  UTIs.  Anemia.  Asthma.  Anxiety/depression..  Seizures.  Obesity.  Hypertension. 2012 D & E for fetal demise.  2019 uterine ablation of uterine fibroids associated with menorrhagia and anemia.  RUQ Abdominal ultrasound 2015 showed heterogeneous liver texture suggesting fatty infiltration or diffuse hepatocellular disease. °No prior upper endoscopy or colonoscopy. ° °Pt has tired for a couple of days and anorexia to heavy, fatty foods for about a week.  No nausea.  Since the beginning of December patient has lost about 10 pounds unintentionally.  Beginning in the early morning of Wednesday, 1/13 patient woke up with fever to 102 degrees F and pain in her low back and lower abdomen.  She stuck it out for a few hours and it about 6 AM went to [facility] where she had blood work.  Other than glucose of 133, c-Met unremarkable.  Lactic acid was elevated at 2.1, WBCs 15.8.  Discharged home with prescription for Keflex which she took after.  Arriving back home around 5 PM.  She ate a small amount of rice and sausage to and went to bed.  Couple of hours later she woke up with recurrent pain and fever to 106.5.  At its worst the pain is 8 out of 10 but currently it has mostly subsided.  She called EMS and was transported to [place]. °04/25/2019 CTAP with contrast: 2.6 cm mass in the tail of the pancreas.  No ductal dilation. °04/26/2019 MRI abdomen.  3.1 x 2.7 cystic lesion in the tail of the pancreas most likely mucinous  cystadenoma or mucinous cystic neoplasm.  Pseudopapillary tumor also a consideration.  Recommend endoscopic biopsy if possible.  2 small, sub-8 mm, cystic lesion in the head of the pancreas without worrisome features. ° °At arrival her temperature was 103.1.  Blood pressure 130s/70s.  Oxygen saturations trending low 90s percent.  Over the course of her stay in the ED her blood pressure has dropped into the 90s/50s - 60s but no tachycardia.   °LFTs not elevated, albumin is low at 6.9. °Hb 11.5 (was 14.4 -12.8 at [facility] yesterday).  Platelets 140.  WBCs 10.8. ° °Antibiotic coverage consists of Maxipime and IV Flagyl, pain has calmed down. °  °Family history pertinent for benign hepatic cyst and colon polyps in her mother. ° °Past Medical History:  °Diagnosis Date  °• Allergy   °• Anemia   °• Anxiety   °• Asthma   °• Chronic headache   °• Depression   °• Hypertension   °• IBS (irritable bowel syndrome)   °• Kidney stones   °• Nephrolithiasis   °• Obesity   °• Seizures (HCC)   ° ° °Past Surgical History:  °Procedure Laterality Date  °•   ABLATION    °• BREAST SURGERY    °• TONSILLECTOMY  1987  ° ° °Prior to Admission medications   °Medication Sig Start Date End Date Taking? Authorizing Provider  °albuterol (VENTOLIN HFA) 108 (90 Base) MCG/ACT inhaler Inhale 2 puffs into the lungs every 6 (six) hours as needed for wheezing or shortness of breath. 04/17/19  Yes Santiago, Irma M, MD  °amLODipine (NORVASC) 5 MG tablet Take 1 tablet (5 mg total) by mouth daily. 04/17/19  Yes Santiago, Irma M, MD  °cephALEXin (KEFLEX) 500 MG capsule Take 1 capsule (500 mg total) by mouth 2 (two) times daily. 04/25/19  Yes Stafford, Phillip, MD  °EPINEPHrine (EPIPEN) 0.3 mg/0.3 mL SOAJ injection Inject 0.3 mLs (0.3 mg total) into the muscle once. 03/12/13  Yes Hopper, David H, MD  °escitalopram (LEXAPRO) 5 MG tablet Take 1 tablet (5 mg total) by mouth daily. 04/17/19  Yes Santiago, Irma M, MD  °fluticasone (FLONASE) 50 MCG/ACT nasal spray Place 2 sprays  into both nostrils daily. °Patient taking differently: Place 2 sprays into both nostrils daily as needed for allergies.  10/27/17  Yes Stallings, Zoe A, MD  °ibuprofen (ADVIL,MOTRIN) 200 MG tablet Take 200 mg by mouth every 6 (six) hours as needed.   Yes [provider]  °LORazepam (ATIVAN) 0.5 MG tablet Take 1/2-1 tab PO BID prn °Patient taking differently: Take 0.25 mg by mouth 2 (two) times daily as needed for anxiety. Take 1/2-1 tab PO BID prn 09/12/18  Yes Corum, Lisa L, MD  °Fluticasone Furoate-Vilanterol (BREO ELLIPTA) 100-25 MCG/INH AEPB Inhale 1 puff into the lungs daily.     [provider]  ° ° °Scheduled Meds: °• enoxaparin (LOVENOX) injection  40 mg Subcutaneous Daily  °• escitalopram  5 mg Oral Daily  °• fluticasone  2 spray Each Nare Daily  °• fluticasone furoate-vilanterol  1 puff Inhalation Daily  ° °Infusions: °• 0.9 % NaCl with KCl 20 mEq / L 100 mL/hr at 04/26/19 0617  °• ceFEPime (MAXIPIME) IV Stopped (04/26/19 1442)  °• magnesium sulfate bolus IVPB Stopped (04/26/19 0845)  °• metronidazole 500 mg (04/26/19 1451)  °• sodium chloride    °• vancomycin Stopped (04/26/19 1337)  ° °PRN Meds: °acetaminophen **OR** acetaminophen, albuterol, LORazepam, ondansetron ° ° °Allergies as of 04/25/2019 - Review Complete 04/25/2019  °Allergen Reaction Noted  °• Amoxicillin  03/12/2013  °• Codeine    ° ° °Family History  °Problem Relation Age of Onset  °• Hypertension Mother   °• Hyperthyroidism Mother   °• Heart failure Father   °• Heart failure Maternal Grandmother   °• Hypertension Maternal Grandmother   °• Kidney disease Maternal Grandmother   °• Heart failure Paternal Grandfather   ° ° °Social History  ° °Socioeconomic History  °• Marital status: Legally Separated  °  Spouse name: Not on file  °• Number of children: Not on file  °• Years of education: Not on file  °• Highest education level: Not on file  °Occupational History  °• Not on file  °Tobacco Use  °• Smoking status: Never Smoker  °•  Smokeless tobacco: Never Used  °Substance and Sexual Activity  °• Alcohol use: No  °  Alcohol/week: 0.0 standard drinks  °• Drug use: No  °• Sexual activity: Never  °Other Topics Concern  °• Not on file  °Social History Narrative  °• Not on file  ° °Social Determinants of Health  ° °Financial Resource Strain:   °• Difficulty of Paying Living Expenses: Not   on file  °Food Insecurity:   °• Worried About Running Out of Food in the Last Year: Not on file  °• Ran Out of Food in the Last Year: Not on file  °Transportation Needs:   °• Lack of Transportation (Medical): Not on file  °• Lack of Transportation (Non-Medical): Not on file  °Physical Activity:   °• Days of Exercise per Week: Not on file  °• Minutes of Exercise per Session: Not on file  °Stress:   °• Feeling of Stress : Not on file  °Social Connections:   °• Frequency of Communication with Friends and Family: Not on file  °• Frequency of Social Gatherings with Friends and Family: Not on file  °• Attends Religious Services: Not on file  °• Active Member of Clubs or Organizations: Not on file  °• Attends Club or Organization Meetings: Not on file  °• Marital Status: Not on file  °Intimate Partner Violence:   °• Fear of Current or Ex-Partner: Not on file  °• Emotionally Abused: Not on file  °• Physically Abused: Not on file  °• Sexually Abused: Not on file  ° ° °REVIEW OF SYSTEMS: °Constitutional: Fatigue, weakness. °ENT:  No nose bleeds °Pulm: No shortness of breath.  No cough. °CV:  No palpitations, no LE edema.  No angina °GU:  No hematuria, no frequency.  No dysuria °GI: See HPI.  Prior to recent days the patient has occasional IBS mostly diarrheal prone but occasional constipation.  No blood per rectum. °Heme: Denies previous blood transfusions but did take oral iron for 2 years because of menorrhagia induced anemia.  Anemia has resolved following uterine ablation. °Transfusions: None °Neuro:  No headaches, no peripheral tingling or numbness.  Felt a bit dizzy  with the fever but no syncope. °Derm:  No itching, no rash or sores.  °Endocrine:  No sweats or chills.  No polyuria or dysuria °Immunization: Reviewed.  She is up-to-date on her flu shot. °Travel:  None beyond local counties in last few months.  ° ° °PHYSICAL EXAM: °Vital signs in last 24 hours: °Vitals:  ° 04/26/19 0745 04/26/19 1430  °BP: 92/63 120/83  °Pulse: 81 93  °Resp: (!) 23 16  °Temp:    °SpO2: 97% 98%  ° °Wt Readings from Last 3 Encounters:  °04/25/19 111.1 kg  °04/25/19 111.1 kg  °04/17/19 113.6 kg  ° ° °General: Obese, laying comfortably on the bed.  She is tired but does not look toxic or acutely ill °Head: No facial asymmetry or swelling.  No signs of head trauma. °Eyes: No scleral icterus.  No conjunctival pallor. °Ears: Not hard of hearing °Nose: No congestion or discharge °Mouth: Good dentition.  Tongue midline.  Moist, clear, pink mucosa. °Neck: No JVD, no masses, no thyromegaly. °Lungs: No labored breathing, no cough.  Lungs clear bilaterally °Heart: RRR.  No MRG.  S1, S2 present °Abdomen: Soft.  Minimally tender in the upper abdomen but no guarding or rebound.  Active bowel sounds.  No HSM, masses, bruits, hernias.   °Rectal: Deferred °Musc/Skeltl: No joint redness, swelling or gross deformity. °Extremities: No CCE °Neurologic: Alert.  Oriented x3.  Good historian.  Moves all 4 limbs.  No tremor or gross weakness.  Strength not tested. °Skin: No sores or rashes.  Skin across the upper anterior trunk, sternal region is hyperemic. °Nodes: No cervical adenopathy °Psych: Cooperative, calm, pleasant.  Fluid speech.  Good historian. ° °Intake/Output from previous day: °01/13 0701 - 01/14 0700 °In: 1000 [I.V.:1000] °Out: -  °  Intake/Output this shift: °No intake/output data recorded. ° °LAB RESULTS: °Recent Labs  °  04/25/19 °0808 04/25/19 °2122 04/26/19 °0508  °WBC 15.8* 5.0 10.8*  °HGB 14.4 12.8 11.5*  °HCT 45.0 40.5 37.0  °PLT 229 151 140*  ° °BMET °Lab Results  °Component Value Date  ° NA 142  04/26/2019  ° NA 142 04/25/2019  ° NA 140 04/25/2019  ° K 3.4 (L) 04/26/2019  ° K 3.1 (L) 04/25/2019  ° K 3.5 04/25/2019  ° CL 112 (H) 04/26/2019  ° CL 111 04/25/2019  ° CL 107 04/25/2019  ° CO2 21 (L) 04/26/2019  ° CO2 22 04/25/2019  ° CO2 22 04/25/2019  ° GLUCOSE 155 (H) 04/26/2019  ° GLUCOSE 113 (H) 04/25/2019  ° GLUCOSE 133 (H) 04/25/2019  ° BUN 12 04/26/2019  ° BUN 12 04/25/2019  ° BUN 17 04/25/2019  ° CREATININE 0.93 04/26/2019  ° CREATININE 1.00 04/25/2019  ° CREATININE 0.78 04/25/2019  ° CALCIUM 8.2 (L) 04/26/2019  ° CALCIUM 8.3 (L) 04/25/2019  ° CALCIUM 9.5 04/25/2019  ° °LFT °Recent Labs  °  04/25/19 °0808 04/25/19 °2122 04/26/19 °0508  °PROT 7.8 6.0* 5.5*  °ALBUMIN 4.2 3.3* 2.9*  °AST 22 25 29  °ALT 23 24 33  °ALKPHOS 83 71 69  °BILITOT 0.8 0.8 0.7  ° °PT/INR °Lab Results  °Component Value Date  ° INR 1.1 04/25/2019  ° °Hepatitis Panel °No results for input(s): HEPBSAG, HCVAB, HEPAIGM, HEPBIGM in the last 72 hours. °C-Diff °No components found for: CDIFF °Lipase  °   °Component Value Date/Time  ° LIPASE 34 04/25/2019 2122  ° ° °Drugs of Abuse  °No results found for: LABOPIA, COCAINSCRNUR, LABBENZ, AMPHETMU, THCU, LABBARB  ° °RADIOLOGY STUDIES: °MR ABDOMEN W WO CONTRAST ° °Result Date: 04/26/2019 °CLINICAL DATA:  Evaluate pancreatic lesions seen on recent CT scan. EXAM: MRI ABDOMEN WITHOUT AND WITH CONTRAST TECHNIQUE: Multiplanar multisequence MR imaging of the abdomen was performed both before and after the administration of intravenous contrast. CONTRAST:  9mL GADAVIST GADOBUTROL 1 MMOL/ML IV SOLN COMPARISON:  CT scan 04/25/2019 FINDINGS: Lower chest: The lung bases are grossly clear. No pulmonary lesions, pleural or pericardial effusion. Hepatobiliary: No focal hepatic lesions or intrahepatic biliary dilatation. The gallbladder appears normal. The portal and hepatic veins are patent. No common bile duct dilatation. Pancreas: As demonstrated on the CT scan there is a 3.1 x 2.7 cm lesion in the pancreatic  tail. This appears fairly well encapsulated and appears largely cystic on the T2 weighted images with septations and faint nodularity. The lesion demonstrates subsequent internal contrast enhancement. Given the patient's age and this location AP mucinous cystic neoplasm or mucinous cystadenoma would be a strong consideration. A solid pseudopapillary tumor is also a consideration. There is a 6 mm exophytic cyst projecting off the pancreatic head (image 28/4. No contrast enhancement is demonstrated. 7 mm cystic lesion noted in the pancreatic head inferiorly on image 33/4. No contrast enhancement is demonstrated. These are both likely benign postinflammatory cysts. Spleen:  Within normal limits in size.  No splenic lesions. Adrenals/Urinary Tract: The adrenal glands and kidneys are unremarkable. Stomach/Bowel: Visualized portions within the abdomen are unremarkable. Vascular/Lymphatic: The aorta and branch vessels are normal. The major venous structures are patent. No mesenteric or retroperitoneal mass or adenopathy. Other:  No ascites or abdominal wall hernia. Musculoskeletal: No significant bony findings. IMPRESSION: 1. 3.1 x 2.7 cm cystic appearing lesion in the pancreatic tail, most likely a mucinous cystadenoma or mucinous cystic neoplasm. Solid pseudopapillary   tumor would also be a consideration in this age. Recommend endoscopic biopsy (if possible in this location). 2. Two small (sub 8 mm) cystic lesions in the pancreatic head without worrisome MR imaging features. These will require surveillance. 3. No findings for metastatic disease or adenopathy. Electronically Signed   By: P.  Gallerani M.D.   On: 04/26/2019 14:11  ° °CT Abdomen Pelvis W Contrast ° °Result Date: 04/25/2019 °CLINICAL DATA:  Left flank and left lower quadrant pain since early this morning. Hematuria. EXAM: CT ABDOMEN AND PELVIS WITH CONTRAST TECHNIQUE: Multidetector CT imaging of the abdomen and pelvis was performed using the standard protocol  following bolus administration of intravenous contrast. CONTRAST:  100mL OMNIPAQUE IOHEXOL 300 MG/ML  SOLN COMPARISON:  None. FINDINGS: Lower chest: No acute abnormality. Hepatobiliary: No focal liver abnormality is seen. No gallstones, gallbladder wall thickening, or biliary dilatation. Pancreas: There is a homogeneous mass in the pancreatic tail measuring 2.6 x 2.2 x 2.4 cm, average Hounsfield units of 43. No other pancreatic masses. No inflammation. Spleen: Normal in size without focal abnormality. Adrenals/Urinary Tract: No adrenal masses. Kidneys are normal in size, orientation and position with symmetric enhancement and excretion. No masses, stones or hydronephrosis. Normal ureters. Normal bladder. Stomach/Bowel: Normal stomach. Small bowel is normal in caliber it no inflammation colon is normal caliber. There are scattered colonic diverticula, mostly on the left. No diverticulitis, wall thickening or other inflammatory process. Normal appendix visualized. Vascular/Lymphatic: No significant vascular findings are present. No enlarged abdominal or pelvic lymph nodes. Reproductive: Uterus and bilateral adnexa are unremarkable. Other: No abdominal wall hernia or abnormality. No abdominopelvic ascites. Musculoskeletal: No acute or significant osseous findings. IMPRESSION: 1. No acute findings. No renal or ureteral stones or obstructive uropathy. No findings to account for the patient's left-sided pain and hematuria. 2. 2.6 cm pancreatic mass. Recommend further evaluation and characterization with pancreatic MRI without and with contrast. 3. Colonic diverticulosis without evidence of diverticulitis. Electronically Signed   By: David  Ormond M.D.   On: 04/25/2019 12:57  ° °DG Chest Port 1 View ° °Result Date: 04/25/2019 °CLINICAL DATA:  43-year-old female with fever EXAM: PORTABLE CHEST 1 VIEW COMPARISON:  Chest radiograph dated 06/21/2017 FINDINGS: Left lung base nodular densities concerning for developing  infiltrate, possibly viral or atypical. Clinical correlation is recommended. No focal consolidation, pleural effusion or pneumothorax. Stable cardiomediastinal silhouette. No acute osseous pathology. IMPRESSION: Findings concerning for developing infiltrate at the left lung base. Clinical correlation is recommended. Electronically Signed   By: Arash  Radparvar M.D.   On: 04/25/2019 21:47  ° ° ° °IMPRESSION:  ° °*   Cystic lesion in tail of pancreas, smaller cystic lesions at the head of the pancreas °Not clear that this is going to be amenable to EUS, FNA due to its location. ° °*   Fevers, leukocytosis, ? if related to findings in the pancreas?. ° °*    Mild thrombocytopenia. ° °*    Mild anemia, MCV normal.  Hx menorrhagia associated iron deficiency anemia resolved after uterine ablation. ° °*   IBS, diarrhea predominant.  Longstanding.  No previous endoscopic work-up for this. ° ° °PLAN:  °  ° °*  Per Dr Pyrtle.   ° °*   Pt received Lovenox at 1120 this AM, placed it on hold w next dose set for Sat 10 AM and made her npo after midnight in case EUS is pursued but need to reach out to Dr Jacobs re EUS.   ° ° °Sharion Grieves    04/26/2019, 3:30 PM °Phone [phone] ° ° °  °

## 2019-04-26 NOTE — H&P (Signed)
History and Physical    Sylvia Cantrell JJH:[id number] DOB: 29-Aug-1975 DOA: 04/25/2019  PCP: Doristine Bosworth, MD   Patient coming from: Home.  I have personally briefly reviewed patient's old medical records in [facility]  Chief Complaint: Fever.  HPI: Sylvia Cantrell is a 44 y.o. female with medical history significant of allergy, anemia, anxiety, asthma, chronic headache, depression, hypertension, IBS, kidney stones, nephrolithiasis, obesity, seizures who has been having a fever since around 0230 on 04/25/2019.  She was seen at [facility], diagnosed with a UTI, prescribed Keflex and discharged home.  The patient states that later in the evening she became nauseous and had emesis shortly after she tried to take medications with some food so she decided to come to [facility] ED.  States that she has had a headache, some lower back pain and urine frequency, but denies dysuria or suprapubic discomfort.  She mentions that her symptoms feel like when she has a UTI.  She denies rhinorrhea, sore throat, productive cough or dyspnea.  Denies chest pain, dyspnea, palpitations, but complains of dizziness.  She occasionally gets lower extremity edema but not recently.  Denies polyuria, polydipsia, polyphagia or blurred vision.  ED Course: Initial vital signs were temperature 103.1 F, pulse 125, respiratory rate 22, blood pressure 131/72 mmHg and O2 sat 99% on room air.  However, her O2 sat declined to the mid to low 90s and her BP decreased to 89/57 mmHg.  Tachycardia subsided after IV fluids.  She was given a normal saline bolus, Toradol 30 mg IVP x1, cefepime, metronidazole and vancomycin.  CBC was normal. PT 13.6, INR 1.1 and APTT 29.  Lipase 34, lactic acid was 1.8.Potassium is 3.1 mmol/L.  Glucose 113 and calcium 8.3 mg/dL.  Corrected calcium is 8.9 mg/dL.  Total protein 6.0 and albumin 3.3 g/dL.  The rest of the LFTs and her renal function are within normal limits.  A urinalysis only show hemoglobinuria.  SARS  coronavirus 2, influenza 8 and influenza B by PCR was negative.  Magnesium is 1.7 and phosphorus less than 1.0 mg/dL.  Review of Systems: As per HPI otherwise 10 point review of systems negative.  Past Medical History:  Diagnosis Date  . Allergy   . Anemia   . Anemia   . Anxiety   . Asthma   . Chronic headache   . Depression   . Hypertension   . IBS (irritable bowel syndrome)   . Kidney stones   . Nephrolithiasis   . Obesity   . Seizures (HCC)     Past Surgical History:  Procedure Laterality Date  . ABLATION    . BREAST SURGERY    . TONSILLECTOMY  1987     reports that she has never smoked. She has never used smokeless tobacco. She reports that she does not drink alcohol or use drugs.  Allergies  Allergen Reactions  . Amoxicillin     Hives with first dose  . Codeine     REACTION: itching    Family History  Problem Relation Age of Onset  . Hypertension Mother   . Hyperthyroidism Mother   . Heart failure Father   . Heart failure Maternal Grandmother   . Hypertension Maternal Grandmother   . Kidney disease Maternal Grandmother   . Heart failure Paternal Grandfather    Prior to Admission medications   Medication Sig Start Date End Date Taking? Authorizing Provider  albuterol (VENTOLIN HFA) 108 (90 Base) MCG/ACT inhaler Inhale 2 puffs into the lungs every  6 (six) hours as needed for wheezing or shortness of breath. 04/17/19   Myles Lipps, MD  amLODipine (NORVASC) 5 MG tablet Take 1 tablet (5 mg total) by mouth daily. 04/17/19   Myles Lipps, MD  cephALEXin (KEFLEX) 500 MG capsule Take 1 capsule (500 mg total) by mouth 2 (two) times daily. 04/25/19   Sharman Cheek, MD  EPINEPHrine (EPIPEN) 0.3 mg/0.3 mL SOAJ injection Inject 0.3 mLs (0.3 mg total) into the muscle once. 03/12/13   Peyton Najjar, MD  escitalopram (LEXAPRO) 5 MG tablet Take 1 tablet (5 mg total) by mouth daily. 04/17/19   Myles Lipps, MD  fluticasone (FLONASE) 50 MCG/ACT nasal spray Place 2  sprays into both nostrils daily. 10/27/17   Doristine Bosworth, MD  Fluticasone Furoate-Vilanterol (BREO ELLIPTA) 100-25 MCG/INH AEPB Inhale into the lungs.    [provider]  ibuprofen (ADVIL,MOTRIN) 200 MG tablet Take 200 mg by mouth every 6 (six) hours as needed.    [provider]  LORazepam (ATIVAN) 0.5 MG tablet Take 1/2-1 tab PO BID prn 09/12/18   Wandra Feinstein, MD    Physical Exam: Vitals:   04/26/19 0100 04/26/19 0139 04/26/19 0200 04/26/19 0205  BP: (!) 93/54 (!) 91/55 (!) 89/57 (!) 89/57  Pulse: (!) 106 94 94 94  Resp: (!) 29 (!) 21 20 (!) 24  Temp:      TempSrc:      SpO2: 93% 94% 93% 93%  Weight:      Height:        Constitutional: Febrile, looks acutely ill. Eyes: PERRL, lids and conjunctivae mildly injected. ENMT: Mucous membranes are moist. Posterior pharynx clear of any exudate or lesions. Neck: normal, supple, no masses, no thyromegaly Respiratory: LLL crackles, no wheezing, no rhonchi. Normal respiratory effort. No accessory muscle use.  Cardiovascular: Regular rate and rhythm, no murmurs / rubs / gallops. No extremity edema. 2+ pedal pulses. No carotid bruits.  Abdomen: Obese, nondistended.  Bowel sounds positive.  Positive mild lower quadrant and suprapubic tenderness, no guarding or rebound, no masses palpated. No hepatosplenomegaly. Musculoskeletal: no clubbing / cyanosis. Good ROM, no contractures. Normal muscle tone.  Skin: no rashes, lesions, ulcers on very limited dermatological examination. Neurologic: CN 2-12 grossly intact. Sensation intact, DTR normal. Strength 5/5 in all 4.  Psychiatric: Normal judgment and insight. Alert and oriented x 3. Normal mood.   Labs on Admission: I have personally reviewed following labs and imaging studies  CBC: Recent Labs  Lab 04/25/19 0808 04/25/19 2122  WBC 15.8* 5.0  NEUTROABS  --  3.8  HGB 14.4 12.8  HCT 45.0 40.5  MCV 83.2 87.3  PLT 229 151   Basic Metabolic Panel: Recent Labs  Lab  04/25/19 0808 04/25/19 2122  NA 140 142  K 3.5 3.1*  CL 107 111  CO2 22 22  GLUCOSE 133* 113*  BUN 17 12  CREATININE 0.78 1.00  CALCIUM 9.5 8.3*  MG  --  1.7  PHOS  --  <1.0*   GFR: Estimated Creatinine Clearance: 91.6 mL/min (by C-G formula based on SCr of 1 mg/dL). Liver Function Tests: Recent Labs  Lab 04/25/19 0808 04/25/19 2122  AST 22 25  ALT 23 24  ALKPHOS 83 71  BILITOT 0.8 0.8  PROT 7.8 6.0*  ALBUMIN 4.2 3.3*   Recent Labs  Lab 04/25/19 0808 04/25/19 2122  LIPASE 40 34   No results for input(s): AMMONIA in the last 168 hours. Coagulation Profile: Recent Labs  Lab 04/25/19 2122  INR 1.1   Cardiac Enzymes: No results for input(s): CKTOTAL, CKMB, CKMBINDEX, TROPONINI in the last 168 hours. BNP (last 3 results) No results for input(s): PROBNP in the last 8760 hours. HbA1C: No results for input(s): HGBA1C in the last 72 hours. CBG: No results for input(s): GLUCAP in the last 168 hours. Lipid Profile: No results for input(s): CHOL, HDL, LDLCALC, TRIG, CHOLHDL, LDLDIRECT in the last 72 hours. Thyroid Function Tests: No results for input(s): TSH, T4TOTAL, FREET4, T3FREE, THYROIDAB in the last 72 hours. Anemia Panel: No results for input(s): VITAMINB12, FOLATE, FERRITIN, TIBC, IRON, RETICCTPCT in the last 72 hours. Urine analysis:    Component Value Date/Time   COLORURINE YELLOW 04/26/2019 0038   APPEARANCEUR CLEAR 04/26/2019 0038   LABSPEC 1.016 04/26/2019 0038   PHURINE 5.0 04/26/2019 0038   GLUCOSEU NEGATIVE 04/26/2019 0038   HGBUR MODERATE (A) 04/26/2019 0038   BILIRUBINUR NEGATIVE 04/26/2019 0038   BILIRUBINUR negative 08/10/2016 1021   BILIRUBINUR neg 11/18/2014 1109   KETONESUR NEGATIVE 04/26/2019 0038   PROTEINUR NEGATIVE 04/26/2019 0038   UROBILINOGEN 0.2 08/10/2016 1021   UROBILINOGEN 0.2 09/08/2010 0958   NITRITE NEGATIVE 04/26/2019 0038   LEUKOCYTESUR NEGATIVE 04/26/2019 0038    Radiological Exams on Admission: CT Abdomen Pelvis  W Contrast  Result Date: 04/25/2019 CLINICAL DATA:  Left flank and left lower quadrant pain since early this morning. Hematuria. EXAM: CT ABDOMEN AND PELVIS WITH CONTRAST TECHNIQUE: Multidetector CT imaging of the abdomen and pelvis was performed using the standard protocol following bolus administration of intravenous contrast. CONTRAST:  163mL OMNIPAQUE IOHEXOL 300 MG/ML  SOLN COMPARISON:  None. FINDINGS: Lower chest: No acute abnormality. Hepatobiliary: No focal liver abnormality is seen. No gallstones, gallbladder wall thickening, or biliary dilatation. Pancreas: There is a homogeneous mass in the pancreatic tail measuring 2.6 x 2.2 x 2.4 cm, average Hounsfield units of 43. No other pancreatic masses. No inflammation. Spleen: Normal in size without focal abnormality. Adrenals/Urinary Tract: No adrenal masses. Kidneys are normal in size, orientation and position with symmetric enhancement and excretion. No masses, stones or hydronephrosis. Normal ureters. Normal bladder. Stomach/Bowel: Normal stomach. Small bowel is normal in caliber it no inflammation colon is normal caliber. There are scattered colonic diverticula, mostly on the left. No diverticulitis, wall thickening or other inflammatory process. Normal appendix visualized. Vascular/Lymphatic: No significant vascular findings are present. No enlarged abdominal or pelvic lymph nodes. Reproductive: Uterus and bilateral adnexa are unremarkable. Other: No abdominal wall hernia or abnormality. No abdominopelvic ascites. Musculoskeletal: No acute or significant osseous findings. IMPRESSION: 1. No acute findings. No renal or ureteral stones or obstructive uropathy. No findings to account for the patient's left-sided pain and hematuria. 2. 2.6 cm pancreatic mass. Recommend further evaluation and characterization with pancreatic MRI without and with contrast. 3. Colonic diverticulosis without evidence of diverticulitis. Electronically Signed   By: Lajean Manes  M.D.   On: 04/25/2019 12:57   DG Chest Port 1 View  Result Date: 04/25/2019 CLINICAL DATA:  44 year old female with fever EXAM: PORTABLE CHEST 1 VIEW COMPARISON:  Chest radiograph dated 06/21/2017 FINDINGS: Left lung base nodular densities concerning for developing infiltrate, possibly viral or atypical. Clinical correlation is recommended. No focal consolidation, pleural effusion or pneumothorax. Stable cardiomediastinal silhouette. No acute osseous pathology. IMPRESSION: Findings concerning for developing infiltrate at the left lung base. Clinical correlation is recommended. Electronically Signed   By: Anner Crete M.D.   On: 04/25/2019 21:47    EKG: Independently reviewed.  Vent. rate  116 BPM PR interval * ms QRS duration 88 ms QT/QTc 275/382 ms P-R-T axes -2 50 3  Assessment/Plan Principal Problem:   Sepsis due to undetermined organism (HCC) Admit to progressive unit/inpatient. Continue IV fluids. Continue cefepime 2 g IVPB every 8 hours. Metronidazole 500 mg IVPB every 8 hours. Continue vancomycin per pharmacy. Follow blood culture and sensitivity. Follow-up morning labs.  Active Problems:   Hypophosphatemia Continue IV replacement. Follow-up phosphorus level as needed. Check vitamin D level.    Hypokalemia Replacing. Follow potassium level. Supplement magnesium.    Pancreatic mass Briefly discussed with the patient.  Check MRI of abdomen with and without contrast.    Depression Continue Lexapro.    DVT prophylaxis: Lovenox SQ. Code Status: Full code. Family Communication: Disposition Plan: Admit to progressive unit for sepsis treatment. Consults called: Admission status: Inpatient/progressive unit.   Bobette Mo MD [facility]  If 7PM-7AM, please contact night-coverage [email or web address]  04/26/2019, 3:03 AM   This document was prepared using Dragon voice recognition software and may contain some unintended transcription errors.

## 2019-04-26 NOTE — ED Notes (Signed)
Breakfast tray given. °

## 2019-04-27 LAB — CBC
HCT: 34.5 % — ABNORMAL LOW (ref 36.0–46.0)
Hemoglobin: 11.1 g/dL — ABNORMAL LOW (ref 12.0–15.0)
MCH: 27.6 pg (ref 26.0–34.0)
MCHC: 32.2 g/dL (ref 30.0–36.0)
MCV: 85.8 fL (ref 80.0–100.0)
Platelets: 129 10*3/uL — ABNORMAL LOW (ref 150–400)
RBC: 4.02 MIL/uL (ref 3.87–5.11)
RDW: 15 % (ref 11.5–15.5)
WBC: 5.7 10*3/uL (ref 4.0–10.5)
nRBC: 0 % (ref 0.0–0.2)

## 2019-04-27 LAB — COMPREHENSIVE METABOLIC PANEL
ALT: 39 U/L (ref 0–44)
AST: 25 U/L (ref 15–41)
Albumin: 2.6 g/dL — ABNORMAL LOW (ref 3.5–5.0)
Alkaline Phosphatase: 70 U/L (ref 38–126)
Anion gap: 8 (ref 5–15)
BUN: 8 mg/dL (ref 6–20)
CO2: 21 mmol/L — ABNORMAL LOW (ref 22–32)
Calcium: 8.3 mg/dL — ABNORMAL LOW (ref 8.9–10.3)
Chloride: 112 mmol/L — ABNORMAL HIGH (ref 98–111)
Creatinine, Ser: 0.72 mg/dL (ref 0.44–1.00)
GFR calc Af Amer: >60 mL/min (ref >60)
GFR calc non Af Amer: >60 mL/min (ref >60)
Glucose, Bld: 101 mg/dL — ABNORMAL HIGH (ref 70–99)
Potassium: 3.7 mmol/L (ref 3.5–5.1)
Sodium: 141 mmol/L (ref 135–145)
Total Bilirubin: 0.8 mg/dL (ref 0.3–1.2)
Total Protein: 5.4 g/dL — ABNORMAL LOW (ref 6.5–8.1)

## 2019-04-27 LAB — HIV ANTIBODY (ROUTINE TESTING W REFLEX): HIV Screen 4th Generation wRfx: NONREACTIVE

## 2019-04-27 LAB — URINE CULTURE: Culture: NO GROWTH

## 2019-04-27 MED ORDER — PANTOPRAZOLE SODIUM 40 MG PO TBEC
40.0000 mg | DELAYED_RELEASE_TABLET | Freq: Every day | ORAL | Status: DC
  Administered 2019-04-27 – 2019-04-28 (×2): 40 mg via ORAL
  Filled 2019-04-27 (×2): qty 1

## 2019-04-27 MED ORDER — PROMETHAZINE HCL 25 MG/ML IJ SOLN
25.0000 mg | Freq: Three times a day (TID) | INTRAMUSCULAR | Status: DC | PRN
  Administered 2019-04-27 (×2): 25 mg via INTRAVENOUS
  Filled 2019-04-27 (×2): qty 1

## 2019-04-27 NOTE — Progress Notes (Signed)
PROGRESS NOTE    Patient: Sylvia Cantrell                            PCP: Doristine Bosworth, MD                    DOB: July 29, 1975            DOA: 04/25/2019 WUJ:[id number]             DOS: 04/27/2019, 12:41 PM   LOS: 1 day   Date of Service: The patient was seen and examined on 04/27/2019  Subjective:   The patient was seen and examined this morning, stable, groggy.  But easily arousable. Denies any chest pain abdominal pain or shortness of breath. Had some persistent nausea vomiting early this morning.  Regarding her pancreatic lesions -she is aware of the findings, discussed with GI, no follow-up with EUS as an outpatient.  Brief Narrative:   Sylvia Cantrell is a 44 y.o. female with medical history significant of allergy, anemia, anxiety, asthma, chronic headache, depression, hypertension, IBS, kidney stones, nephrolithiasis, obesity, seizures who has been having a fever since around 0230 on 04/25/2019.  She was seen at [facility], diagnosed with a UTI, prescribed Keflex and discharged home. Presented again at [facility] with nausea, vomiting after she has taken the medication.  Also complaining of headache low back pain urinary frequency.  ED evaluation: T-max 103.1, pulse 125, RR 22, blood pressure 131/72, satting 99% on room air, Briefly hypotensive 89/57, responded to IV fluid resuscitation WBC within normal limits, lactic acid 1.8, potassium 3.1, SARS-CoV-2 negative Influenza a/B PCR negative  -Initiated IV antibiotics of cefepime and vancomycin and metronidazole  04/27/2019 -remains afebrile, complaining of nausea vomiting, poor p.o. intake, on IV fluids, antibiotics, cultures remain negative, status post GI evaluation follow-up as an outpatient   Assessment & Plan:   Principal Problem:   Sepsis due to undetermined organism [facility]) Active Problems:   Hypophosphatemia   Hypokalemia   Pancreatic mass    Sepsis due to undetermined organism (HCC) -Vitals have improved, afebrile,  normotensive this a.m., Continue to have poor p.o. intake, complaining of nausea vomiting  -On admission :T-max 103.1, WBC 18.8 >> 10.8, lactic acid 1.8, >> 1.1, -Remains mildly hypotensive blood pressure 92/63, RR 23, temp 98.4 -Status post aggressive IV fluid resuscitation ... Continue maintenance fluid -Following labs, lactic acid 1.1 - Blood and urine cultures >> negative to date -04/26/2019  Cefepime/Flagyl/vancomycin >>  - 04/27/2019 DC vancomycin.   -Anticipating to narrow down antibiotics -.  Active Problems:   Hypophosphatemia Continue IV replacement. Follow-up phosphorus level as needed. Check vitamin D level.    Hypokalemia Repleted, monitoring. Supplement magnesium.    Pancreatic mass -Based on imaging finding MRI: 3.1 x 2.7 cm cystic appearing lesion in the pancreatic tail,  At this time it appears  mucinous cystadenoma.  -Curb sided oncologist Dr. Pamelia Hoit -who recommended GI consultation for possible endoscopic ultrasound for further evaluation --GI was consulted, status post evaluation, recommending follow-up as an outpatient with EUS    Depression Continue Lexapro.    DVT prophylaxis: Lovenox SQ. Code Status: Full code. Family Communication: Disposition Plan: Admit to progressive unit for sepsis treatment. Consults called: Admission status: Inpatient/progressive unit.  EXAM: MRI ABDOMEN WITHOUT AND WITH CONTRAST IMPRESSION: 1. 3.1 x 2.7 cm cystic appearing lesion in the pancreatic tail, most likely a mucinous cystadenoma or mucinous cystic neoplasm. Solid pseudopapillary tumor would also be a  consideration in this age. Recommend endoscopic biopsy (if possible in this location). 2. Two small (sub 8 mm) cystic lesions in the pancreatic head without worrisome MR imaging features. These will require surveillance. 3. No findings for metastatic disease or adenopathy.    Procedures:   No admission procedures for hospital encounter.    Antimicrobials:  Anti-infectives (From admission, onward)   Start     Dose/Rate Route Frequency Ordered Stop   04/26/19 1030  vancomycin (VANCOCIN) IVPB 1000 mg/200 mL premix     1,000 mg 200 mL/hr over 60 Minutes Intravenous Every 12 hours 04/25/19 2137     04/26/19 0600  metroNIDAZOLE (FLAGYL) IVPB 500 mg     500 mg 100 mL/hr over 60 Minutes Intravenous Every 8 hours 04/26/19 0310     04/25/19 2200  ceFEPIme (MAXIPIME) 2 g in sodium chloride 0.9 % 100 mL IVPB     2 g 200 mL/hr over 30 Minutes Intravenous Every 8 hours 04/25/19 2137     04/25/19 2145  vancomycin (VANCOCIN) 2,500 mg in sodium chloride 0.9 % 500 mL IVPB     2,500 mg 250 mL/hr over 120 Minutes Intravenous  Once 04/25/19 2137 04/26/19 0155   04/25/19 2130  aztreonam (AZACTAM) 2 g in sodium chloride 0.9 % 100 mL IVPB  Status:  Discontinued     2 g 200 mL/hr over 30 Minutes Intravenous  Once 04/25/19 2123 04/25/19 2128   04/25/19 2130  metroNIDAZOLE (FLAGYL) IVPB 500 mg     500 mg 100 mL/hr over 60 Minutes Intravenous  Once 04/25/19 2123 04/25/19 2331   04/25/19 2130  vancomycin (VANCOCIN) IVPB 1000 mg/200 mL premix  Status:  Discontinued     1,000 mg 200 mL/hr over 60 Minutes Intravenous  Once 04/25/19 2123 04/25/19 2137       Medication:  . [START ON 04/28/2019] enoxaparin (LOVENOX) injection  40 mg Subcutaneous Daily  . escitalopram  5 mg Oral Daily  . fluticasone  2 spray Each Nare Daily  . fluticasone furoate-vilanterol  1 puff Inhalation Daily  . pantoprazole  40 mg Oral Q0600    acetaminophen **OR** acetaminophen, albuterol, LORazepam, ondansetron, promethazine   Objective:   Vitals:   04/27/19 0400 04/27/19 0503 04/27/19 0804 04/27/19 1140  BP: 117/72 118/80 (!) 146/87 112/81  Pulse: 82 76 75 77  Resp: 17 17 17 10   Temp: 97.7 F (36.5 C) 98.3 F (36.8 C) 98.5 F (36.9 C) 98.6 F (37 C)  TempSrc: Oral Oral Oral Oral  SpO2: 97% 98% 99% 99%  Weight:      Height:       No intake or output data  in the 24 hours ending 04/27/19 1241 Filed Weights   04/25/19 2105  Weight: 111.1 kg     Examination:  BP 112/81 (BP Location: Left Arm)   Pulse 77   Temp 98.6 F (37 C) (Oral)   Resp 10   Ht 5\' 6"  (1.676 m)   Wt 111.1 kg   LMP 02/23/2019   SpO2 99%   BMI 39.54 kg/m    Physical Exam  Constitution:  Alert, cooperative, no distress,  Psychiatric: Normal and stable mood and affect, cognition intact,   HEENT: Normocephalic, PERRL, otherwise with in Normal limits  Chest:Chest symmetric Cardio vascular:  S1/S2, RRR, No murmure, No Rubs or Gallops  pulmonary: Clear to auscultation bilaterally, respirations unlabored, negative wheezes / crackles Abdomen: Soft, non-tender, non-distended, bowel sounds,no masses, no organomegaly Muscular skeletal: Limited exam - in bed, able  to move all 4 extremities, Normal strength,  Neuro: CNII-XII intact. , normal motor and sensation, reflexes intact  Extremities: +1 pitting edema lower extremities, +2 pulses  Skin: Dry, warm to touch, negative for any Rashes, No open wounds Wounds: per nursing documentation         LABs:  CBC Latest Ref Rng & Units 04/27/2019 04/26/2019 04/25/2019  WBC 4.0 - 10.5 K/uL 5.7 10.8(H) 5.0  Hemoglobin 12.0 - 15.0 g/dL 11.1(L) 11.5(L) 12.8  Hematocrit 36.0 - 46.0 % 34.5(L) 37.0 40.5  Platelets 150 - 400 K/uL 129(L) 140(L) 151   CMP Latest Ref Rng & Units 04/27/2019 04/26/2019 04/25/2019  Glucose 70 - 99 mg/dL 101(H) 155(H) 113(H)  BUN 6 - 20 mg/dL 8 12 12   Creatinine 0.44 - 1.00 mg/dL 0.72 0.93 1.00  Sodium 135 - 145 mmol/L 141 142 142  Potassium 3.5 - 5.1 mmol/L 3.7 3.4(L) 3.1(L)  Chloride 98 - 111 mmol/L 112(H) 112(H) 111  CO2 22 - 32 mmol/L 21(L) 21(L) 22  Calcium 8.9 - 10.3 mg/dL 8.3(L) 8.2(L) 8.3(L)  Total Protein 6.5 - 8.1 g/dL 5.4(L) 5.5(L) 6.0(L)  Total Bilirubin 0.3 - 1.2 mg/dL 0.8 0.7 0.8  Alkaline Phos 38 - 126 U/L 70 69 71  AST 15 - 41 U/L 25 29 25   ALT 0 - 44 U/L 39 33 24    SIGNED: Deatra James, MD, FACP, FHM. [facility],  Pager [phone]  If 7PM-7AM, please contact night-coverage [email or web address] Ota [facility] [place] 04/27/2019, 12:41 PM

## 2019-04-27 NOTE — Progress Notes (Signed)
Daily Rounding Note  04/27/2019, 9:10 AM  LOS: 1 day   SUBJECTIVE:   Chief complaint: cystic lesion in pancreatic tail.    Has developed nausea w/o vomiting but is able to tolerate solid foods.   Stools loose since starting abx.  Fevers better C/o headache.    OBJECTIVE:         Vital signs in last 24 hours:    Temp:  [97.7 F (36.5 C)-98.6 F (37 C)] 98.5 F (36.9 C) (01/15 0804) Pulse Rate:  [75-93] 75 (01/15 0804) Resp:  [13-22] 17 (01/15 0804) BP: (101-146)/(66-92) 146/87 (01/15 0804) SpO2:  [92 %-99 %] 99 % (01/15 0804)   Filed Weights   04/25/19 2105  Weight: 111.1 kg   General: looks ill, not toxic.  Resting but awakens easily   Heart: RRR Chest: clear bil.  No labored breathing Abdomen: soft, NT, ND.  BS active  Extremities: no CCE Neuro/Psych:  Oriented x 3.  No weakness, tremors or gross deficits.    Intake/Output from previous day: No intake/output data recorded.  Intake/Output this shift: No intake/output data recorded.  Lab Results: Recent Labs    04/25/19 2122 04/26/19 0508 04/27/19 0445  WBC 5.0 10.8* 5.7  HGB 12.8 11.5* 11.1*  HCT 40.5 37.0 34.5*  PLT 151 140* 129*   BMET Recent Labs    04/25/19 2122 04/26/19 0508 04/27/19 0445  NA 142 142 141  K 3.1* 3.4* 3.7  CL 111 112* 112*  CO2 22 21* 21*  GLUCOSE 113* 155* 101*  BUN 12 12 8   CREATININE 1.00 0.93 0.72  CALCIUM 8.3* 8.2* 8.3*   LFT Recent Labs    04/25/19 2122 04/26/19 0508 04/27/19 0445  PROT 6.0* 5.5* 5.4*  ALBUMIN 3.3* 2.9* 2.6*  AST 25 29 25   ALT 24 33 39  ALKPHOS 71 69 70  BILITOT 0.8 0.7 0.8   PT/INR Recent Labs    04/25/19 2122  LABPROT 13.6  INR 1.1   Hepatitis Panel No results for input(s): HEPBSAG, HCVAB, HEPAIGM, HEPBIGM in the last 72 hours.  Studies/Results: MR ABDOMEN W WO CONTRAST  Result Date: 04/26/2019 CLINICAL DATA:  Evaluate pancreatic lesions seen on recent CT scan. EXAM: MRI  ABDOMEN WITHOUT AND WITH CONTRAST TECHNIQUE: Multiplanar multisequence MR imaging of the abdomen was performed both before and after the administration of intravenous contrast. CONTRAST:  3mL GADAVIST GADOBUTROL 1 MMOL/ML IV SOLN COMPARISON:  CT scan 04/25/2019 FINDINGS: Lower chest: The lung bases are grossly clear. No pulmonary lesions, pleural or pericardial effusion. Hepatobiliary: No focal hepatic lesions or intrahepatic biliary dilatation. The gallbladder appears normal. The portal and hepatic veins are patent. No common bile duct dilatation. Pancreas: As demonstrated on the CT scan there is a 3.1 x 2.7 cm lesion in the pancreatic tail. This appears fairly well encapsulated and appears largely cystic on the T2 weighted images with septations and faint nodularity. The lesion demonstrates subsequent internal contrast enhancement. Given the patient's age and this location AP mucinous cystic neoplasm or mucinous cystadenoma would be a strong consideration. A solid pseudopapillary tumor is also a consideration. There is a 6 mm exophytic cyst projecting off the pancreatic head (image 28/4. No contrast enhancement is demonstrated. 7 mm cystic lesion noted in the pancreatic head inferiorly on image 33/4. No contrast enhancement is demonstrated. These are both likely benign postinflammatory cysts. Spleen:  Within normal limits in size.  No splenic lesions. Adrenals/Urinary Tract: The adrenal glands and  kidneys are unremarkable. Stomach/Bowel: Visualized portions within the abdomen are unremarkable. Vascular/Lymphatic: The aorta and branch vessels are normal. The major venous structures are patent. No mesenteric or retroperitoneal mass or adenopathy. Other:  No ascites or abdominal wall hernia. Musculoskeletal: No significant bony findings. IMPRESSION: 1. 3.1 x 2.7 cm cystic appearing lesion in the pancreatic tail, most likely a mucinous cystadenoma or mucinous cystic neoplasm. Solid pseudopapillary tumor would also be  a consideration in this age. Recommend endoscopic biopsy (if possible in this location). 2. Two small (sub 8 mm) cystic lesions in the pancreatic head without worrisome MR imaging features. These will require surveillance. 3. No findings for metastatic disease or adenopathy. Electronically Signed   By: Marijo Sanes M.D.   On: 04/26/2019 14:11   CT Abdomen Pelvis W Contrast  Result Date: 04/25/2019 CLINICAL DATA:  Left flank and left lower quadrant pain since early this morning. Hematuria. EXAM: CT ABDOMEN AND PELVIS WITH CONTRAST TECHNIQUE: Multidetector CT imaging of the abdomen and pelvis was performed using the standard protocol following bolus administration of intravenous contrast. CONTRAST:  113mL OMNIPAQUE IOHEXOL 300 MG/ML  SOLN COMPARISON:  None. FINDINGS: Lower chest: No acute abnormality. Hepatobiliary: No focal liver abnormality is seen. No gallstones, gallbladder wall thickening, or biliary dilatation. Pancreas: There is a homogeneous mass in the pancreatic tail measuring 2.6 x 2.2 x 2.4 cm, average Hounsfield units of 43. No other pancreatic masses. No inflammation. Spleen: Normal in size without focal abnormality. Adrenals/Urinary Tract: No adrenal masses. Kidneys are normal in size, orientation and position with symmetric enhancement and excretion. No masses, stones or hydronephrosis. Normal ureters. Normal bladder. Stomach/Bowel: Normal stomach. Small bowel is normal in caliber it no inflammation colon is normal caliber. There are scattered colonic diverticula, mostly on the left. No diverticulitis, wall thickening or other inflammatory process. Normal appendix visualized. Vascular/Lymphatic: No significant vascular findings are present. No enlarged abdominal or pelvic lymph nodes. Reproductive: Uterus and bilateral adnexa are unremarkable. Other: No abdominal wall hernia or abnormality. No abdominopelvic ascites. Musculoskeletal: No acute or significant osseous findings. IMPRESSION: 1. No  acute findings. No renal or ureteral stones or obstructive uropathy. No findings to account for the patient's left-sided pain and hematuria. 2. 2.6 cm pancreatic mass. Recommend further evaluation and characterization with pancreatic MRI without and with contrast. 3. Colonic diverticulosis without evidence of diverticulitis. Electronically Signed   By: Lajean Manes M.D.   On: 04/25/2019 12:57   DG Chest Port 1 View  Result Date: 04/25/2019 CLINICAL DATA:  44 year old female with fever EXAM: PORTABLE CHEST 1 VIEW COMPARISON:  Chest radiograph dated 06/21/2017 FINDINGS: Left lung base nodular densities concerning for developing infiltrate, possibly viral or atypical. Clinical correlation is recommended. No focal consolidation, pleural effusion or pneumothorax. Stable cardiomediastinal silhouette. No acute osseous pathology. IMPRESSION: Findings concerning for developing infiltrate at the left lung base. Clinical correlation is recommended. Electronically Signed   By: Anner Crete M.D.   On: 04/25/2019 21:47   Scheduled Meds: . [START ON 04/28/2019] enoxaparin (LOVENOX) injection  40 mg Subcutaneous Daily  . escitalopram  5 mg Oral Daily  . fluticasone  2 spray Each Nare Daily  . fluticasone furoate-vilanterol  1 puff Inhalation Daily   Continuous Infusions: . 0.9 % NaCl with KCl 20 mEq / L 100 mL/hr at 04/26/19 1859  . ceFEPime (MAXIPIME) IV 2 g (04/27/19 0503)  . magnesium sulfate bolus IVPB Stopped (04/26/19 0845)  . metronidazole 500 mg (04/27/19 0503)  . vancomycin 1,000 mg (04/26/19 2220)  PRN Meds:.acetaminophen **OR** acetaminophen, albuterol, LORazepam, ondansetron, promethazine   ASSESMENT:   *   Cystic mass in tail of pancreas.  LFTs normal other than hypoalbuminemia.  *   Fevers.  Developing LLL PNA, Covid 19 negative x 2.  Day 2 Vanco, Maxipime.   Fevers improved.  Hypotension improved.  Leukocytosis resolved. Blood clxs pndg.    *   Minor, normocytic anemia.  *      Thrombocytopenia.   Receiving Lovenox.  *   Vitamin D deficiency.   PLAN   *   Adding Protonix 40 mg po/day.    *   Will arrange outpt EGD w Dr Christella Hartigan in next few weeks.    *    Follow platelets.  If they continue to decrease, consider alternative to Lovenox for DVT prophylaxis.    Jennye Moccasin  04/27/2019, 9:10 AM Phone [phone]

## 2019-04-28 ENCOUNTER — Telehealth: Payer: Self-pay | Admitting: Gastroenterology

## 2019-04-28 ENCOUNTER — Encounter: Payer: Self-pay | Admitting: Family Medicine

## 2019-04-28 DIAGNOSIS — A419 Sepsis, unspecified organism: Principal | ICD-10-CM

## 2019-04-28 LAB — COMPREHENSIVE METABOLIC PANEL
ALT: 30 U/L (ref 0–44)
AST: 17 U/L (ref 15–41)
Albumin: 2.7 g/dL — ABNORMAL LOW (ref 3.5–5.0)
Alkaline Phosphatase: 82 U/L (ref 38–126)
Anion gap: 6 (ref 5–15)
BUN: 5 mg/dL — ABNORMAL LOW (ref 6–20)
CO2: 24 mmol/L (ref 22–32)
Calcium: 8.5 mg/dL — ABNORMAL LOW (ref 8.9–10.3)
Chloride: 111 mmol/L (ref 98–111)
Creatinine, Ser: 0.77 mg/dL (ref 0.44–1.00)
GFR calc Af Amer: >60 mL/min (ref >60)
GFR calc non Af Amer: >60 mL/min (ref >60)
Glucose, Bld: 85 mg/dL (ref 70–99)
Potassium: 3.3 mmol/L — ABNORMAL LOW (ref 3.5–5.1)
Sodium: 141 mmol/L (ref 135–145)
Total Bilirubin: 0.5 mg/dL (ref 0.3–1.2)
Total Protein: 5.3 g/dL — ABNORMAL LOW (ref 6.5–8.1)

## 2019-04-28 LAB — CBC
HCT: 35.3 % — ABNORMAL LOW (ref 36.0–46.0)
Hemoglobin: 11.4 g/dL — ABNORMAL LOW (ref 12.0–15.0)
MCH: 27.3 pg (ref 26.0–34.0)
MCHC: 32.3 g/dL (ref 30.0–36.0)
MCV: 84.7 fL (ref 80.0–100.0)
Platelets: 150 10*3/uL (ref 150–400)
RBC: 4.17 MIL/uL (ref 3.87–5.11)
RDW: 15.1 % (ref 11.5–15.5)
WBC: 4.3 10*3/uL (ref 4.0–10.5)
nRBC: 0 % (ref 0.0–0.2)

## 2019-04-28 MED ORDER — POTASSIUM CHLORIDE CRYS ER 20 MEQ PO TBCR: 40.0000 meq | EXTENDED_RELEASE_TABLET | Freq: Every day | ORAL | 0 refills | Status: DC

## 2019-04-28 MED ORDER — LEVOFLOXACIN 500 MG PO TABS: 500.0000 mg | ORAL_TABLET | Freq: Every day | ORAL | 0 refills | Status: DC

## 2019-04-28 MED ORDER — LEVOFLOXACIN 500 MG PO TABS: 500.0000 mg | ORAL_TABLET | Freq: Every day | ORAL | Status: DC

## 2019-04-28 MED ORDER — ONDANSETRON 4 MG PO TBDP: 4.0000 mg | ORAL_TABLET | Freq: Three times a day (TID) | ORAL | 0 refills | Status: DC | PRN

## 2019-04-28 MED ORDER — FUROSEMIDE 40 MG PO TABS: 40.0000 mg | ORAL_TABLET | ORAL | 0 refills | Status: DC

## 2019-04-28 NOTE — Telephone Encounter (Signed)
I discussed her case with the inpatient team.  Admitted with febrile illness, seems unlikely to be related to pancreatic tail cyst.  The cyst needs further evaluation. EUS best next step, however noting that extreme tail of pancreas can sometimes not be visible by EUS.  Sylvia Cantrell, She needs upper EUS with myself or Dr. Meridee Score 3-4 weeks from now, Endo 'category 2'. For pancreatic mass.  Thanks

## 2019-04-28 NOTE — Discharge Summary (Signed)
Physician Discharge Summary  Sylvia Cantrell CHE:[id number] DOB: 12-15-1975 DOA: 04/25/2019  PCP: Doristine Bosworth, MD  Admit date: 04/25/2019 Discharge date: 04/28/2019  Time spent: 32 minutes  Recommendations for Outpatient Follow-up:  1. Patient to complete antibiotics in the outpatient setting 2. Lasix every other day as well as potassium given given mild volume overload on discharge 3. Needs repeat chest x-ray 1 month to do note clearing 4. Gastroenterology Dr. Mansouraty/Dr. Gerilyn Pilgrim to coordinate EUS for pancreatic mass found this admission coincidentally-they are aware and should be calling patient 5. Please check phosphorus when she is on a regular diet and consider replacement of the same on discharge follow-up--also may benefit from CBC and renal panel at the same time  Discharge Diagnoses:  Principal Problem:   Sepsis due to undetermined organism [facility]) Active Problems:   Hypophosphatemia   Hypokalemia   Pancreatic mass   Discharge Condition: Improved heart healthy  Diet recommendation: Heart healthy  Filed Weights   04/25/19 2105  Weight: 111.1 kg   43 anemia iron deficiency with heavy menses followed by hematologist Gudena-uterine ablation 2019 for the menorrhagia, HTN, reflux, asthma, kidney stones, IBS, BMI >39 Recent admission 04/25/2019 diagnosed with UTI and sent home patient was admitted for sepsis with undetermined organism-felt possibly to be due to pneumonia pancreatic mass which was 2.6 cm, MRI confirmed 3.1 X2.7 cystic lesion tail of pancreas GI consulted-recommended outpatient follow-up with EUS and signed off  Data Reviewed:  K 3.3, Bun/creat 5/0.77 Wbc 4.3 MRI-IMPRESSION: 1. 3.1 x 2.7 cm cystic appearIng lesion in the pancreatic tail, most likely a mucinous cystadenoma or mucinous cystic neoplasm. Solid pseudopapillary tumor would also be a consideration in this age. Recommend endoscopic biopsy (if possible in this location). 2. Two small (sub 8 mm)  cystic lesions in the pancreatic head without worrisome MR imaging features. These will require surveillance. 3. No findings for metastatic disease or adenopathy.  Assessment & Plan: Sepsis probably secondary left lower lobe pneumonia vs PTA treated UTI vancomycin and cefepime transition based on decreasing WBC trend narrowed to Levaquin Outpatient CXR 1 month Prior severe iron deficiency anemia status post uterine ablation '19 menorrhagia Some dilutional component--hemoglobin acceptable 11 Follow the trends as outpatient Hypokalemia mild 1.3 L positive this admission Replaced potassium on discharge with tablets K. Dur and will give Lasix every other day to help with slow volume reduction from inpatient resuscitation Pancreatic cyst Cyst will need outpatient characterization-appreciate care coordination via  GI thank you IBS Cont lexapro 5 qd, ativan 1 mg bid Discharge Exam: Vitals:   04/28/19 0757 04/28/19 0830  BP: (!) 147/83 (!) 141/98  Pulse: 85 92  Resp: 20 17  Temp: 98.6 F (37 C) 98.3 F (36.8 C)  SpO2: 97% 97%   Awake coherent doing fair feels a little short of breath walking around but was only walking around No chest pain No cough No sputum General: EOMI NCAT no focal deficit Cardiovascular: S1-S2 no murmur Respiratory: No rales no rhonchi no wheezes Abdomen soft no rebound no guarding  Discharge Instructions   Discharge Instructions    Diet - low sodium heart healthy   Complete by: As directed    Discharge instructions   Complete by: As directed    Finish the full course of Levaquin to treat both a possible urinary infection or pneumonia I would recommend that you take Lasix for fluid buildup every other day as you are about 1.3 L positive this admission Your potassium was low and I have given  you a 7-day supply of this Would also recommend that in the outpatient setting you follow-up with recommendations as per gastroenterology-they have indicated in our  notes that they will call you   Increase activity slowly   Complete by: As directed      Allergies as of 04/28/2019      Reactions   Amoxicillin    Hives with first dose   Codeine    REACTION: itching      Medication List    STOP taking these medications   cephALEXin 500 MG capsule Commonly known as: KEFLEX   EPINEPHrine 0.3 mg/0.3 mL Soaj injection Commonly known as: EpiPen   ibuprofen 200 MG tablet Commonly known as: ADVIL     TAKE these medications   albuterol 108 (90 Base) MCG/ACT inhaler Commonly known as: VENTOLIN HFA Inhale 2 puffs into the lungs every 6 (six) hours as needed for wheezing or shortness of breath.   amLODipine 5 MG tablet Commonly known as: NORVASC Take 1 tablet (5 mg total) by mouth daily.   Breo Ellipta 100-25 MCG/INH Aepb Generic drug: fluticasone furoate-vilanterol Inhale 1 puff into the lungs daily.   escitalopram 5 MG tablet Commonly known as: Lexapro Take 1 tablet (5 mg total) by mouth daily.   fluticasone 50 MCG/ACT nasal spray Commonly known as: FLONASE Place 2 sprays into both nostrils daily. What changed:   when to take this  reasons to take this   furosemide 40 MG tablet Commonly known as: Lasix Take 1 tablet (40 mg total) by mouth every other day for 6 days.   levofloxacin 500 MG tablet Commonly known as: LEVAQUIN Take 1 tablet (500 mg total) by mouth daily.   LORazepam 0.5 MG tablet Commonly known as: Ativan Take 1/2-1 tab PO BID prn What changed:   how much to take  how to take this  when to take this  reasons to take this   ondansetron 4 MG disintegrating tablet Commonly known as: ZOFRAN-ODT Take 1 tablet (4 mg total) by mouth every 8 (eight) hours as needed for nausea or vomiting.   potassium chloride SA 20 MEQ tablet Commonly known as: KLOR-CON Take 2 tablets (40 mEq total) by mouth daily.      Allergies  Allergen Reactions  . Amoxicillin     Hives with first dose  . Codeine     REACTION:  itching      The results of significant diagnostics from this hospitalization (including imaging, microbiology, ancillary and laboratory) are listed below for reference.    Significant Diagnostic Studies: MR ABDOMEN W WO CONTRAST  Result Date: 04/26/2019 CLINICAL DATA:  Evaluate pancreatic lesions seen on recent CT scan. EXAM: MRI ABDOMEN WITHOUT AND WITH CONTRAST TECHNIQUE: Multiplanar multisequence MR imaging of the abdomen was performed both before and after the administration of intravenous contrast. CONTRAST:  76mL GADAVIST GADOBUTROL 1 MMOL/ML IV SOLN COMPARISON:  CT scan 04/25/2019 FINDINGS: Lower chest: The lung bases are grossly clear. No pulmonary lesions, pleural or pericardial effusion. Hepatobiliary: No focal hepatic lesions or intrahepatic biliary dilatation. The gallbladder appears normal. The portal and hepatic veins are patent. No common bile duct dilatation. Pancreas: As demonstrated on the CT scan there is a 3.1 x 2.7 cm lesion in the pancreatic tail. This appears fairly well encapsulated and appears largely cystic on the T2 weighted images with septations and faint nodularity. The lesion demonstrates subsequent internal contrast enhancement. Given the patient's age and this location AP mucinous cystic neoplasm or mucinous cystadenoma would  be a strong consideration. A solid pseudopapillary tumor is also a consideration. There is a 6 mm exophytic cyst projecting off the pancreatic head (image 28/4. No contrast enhancement is demonstrated. 7 mm cystic lesion noted in the pancreatic head inferiorly on image 33/4. No contrast enhancement is demonstrated. These are both likely benign postinflammatory cysts. Spleen:  Within normal limits in size.  No splenic lesions. Adrenals/Urinary Tract: The adrenal glands and kidneys are unremarkable. Stomach/Bowel: Visualized portions within the abdomen are unremarkable. Vascular/Lymphatic: The aorta and branch vessels are normal. The major venous  structures are patent. No mesenteric or retroperitoneal mass or adenopathy. Other:  No ascites or abdominal wall hernia. Musculoskeletal: No significant bony findings. IMPRESSION: 1. 3.1 x 2.7 cm cystic appearing lesion in the pancreatic tail, most likely a mucinous cystadenoma or mucinous cystic neoplasm. Solid pseudopapillary tumor would also be a consideration in this age. Recommend endoscopic biopsy (if possible in this location). 2. Two small (sub 8 mm) cystic lesions in the pancreatic head without worrisome MR imaging features. These will require surveillance. 3. No findings for metastatic disease or adenopathy. Electronically Signed   By: Rudie MeyerP.  Gallerani M.D.   On: 04/26/2019 14:11   CT Abdomen Pelvis W Contrast  Result Date: 04/25/2019 CLINICAL DATA:  Left flank and left lower quadrant pain since early this morning. Hematuria. EXAM: CT ABDOMEN AND PELVIS WITH CONTRAST TECHNIQUE: Multidetector CT imaging of the abdomen and pelvis was performed using the standard protocol following bolus administration of intravenous contrast. CONTRAST:  100mL OMNIPAQUE IOHEXOL 300 MG/ML  SOLN COMPARISON:  None. FINDINGS: Lower chest: No acute abnormality. Hepatobiliary: No focal liver abnormality is seen. No gallstones, gallbladder wall thickening, or biliary dilatation. Pancreas: There is a homogeneous mass in the pancreatic tail measuring 2.6 x 2.2 x 2.4 cm, average Hounsfield units of 43. No other pancreatic masses. No inflammation. Spleen: Normal in size without focal abnormality. Adrenals/Urinary Tract: No adrenal masses. Kidneys are normal in size, orientation and position with symmetric enhancement and excretion. No masses, stones or hydronephrosis. Normal ureters. Normal bladder. Stomach/Bowel: Normal stomach. Small bowel is normal in caliber it no inflammation colon is normal caliber. There are scattered colonic diverticula, mostly on the left. No diverticulitis, wall thickening or other inflammatory process.  Normal appendix visualized. Vascular/Lymphatic: No significant vascular findings are present. No enlarged abdominal or pelvic lymph nodes. Reproductive: Uterus and bilateral adnexa are unremarkable. Other: No abdominal wall hernia or abnormality. No abdominopelvic ascites. Musculoskeletal: No acute or significant osseous findings. IMPRESSION: 1. No acute findings. No renal or ureteral stones or obstructive uropathy. No findings to account for the patient's left-sided pain and hematuria. 2. 2.6 cm pancreatic mass. Recommend further evaluation and characterization with pancreatic MRI without and with contrast. 3. Colonic diverticulosis without evidence of diverticulitis. Electronically Signed   By: Amie Portlandavid  Ormond M.D.   On: 04/25/2019 12:57   DG Chest Port 1 View  Result Date: 04/25/2019 CLINICAL DATA:  44 year old female with fever EXAM: PORTABLE CHEST 1 VIEW COMPARISON:  Chest radiograph dated 06/21/2017 FINDINGS: Left lung base nodular densities concerning for developing infiltrate, possibly viral or atypical. Clinical correlation is recommended. No focal consolidation, pleural effusion or pneumothorax. Stable cardiomediastinal silhouette. No acute osseous pathology. IMPRESSION: Findings concerning for developing infiltrate at the left lung base. Clinical correlation is recommended. Electronically Signed   By: Elgie CollardArash  Radparvar M.D.   On: 04/25/2019 21:47    Microbiology: Recent Results (from the past 240 hour(s))  Blood culture (routine x 2)  Status: None (Preliminary result)   Collection Time: 04/25/19  8:08 AM   Specimen: BLOOD  Result Value Ref Range Status   Specimen Description BLOOD LEFT AC  Final   Special Requests   Final    BOTTLES DRAWN AEROBIC AND ANAEROBIC Blood Culture results may not be optimal due to an excessive volume of blood received in culture bottles   Culture   Final    NO GROWTH 3 DAYS Performed at [facility], [address] [place], [place] [address]     Report Status PENDING  Incomplete  Blood culture (routine x 2)     Status: None (Preliminary result)   Collection Time: 04/25/19  9:42 AM   Specimen: BLOOD  Result Value Ref Range Status   Specimen Description BLOOD RIGHT WRIST  Final   Special Requests   Final    BOTTLES DRAWN AEROBIC AND ANAEROBIC Blood Culture results may not be optimal due to an excessive volume of blood received in culture bottles   Culture   Final    NO GROWTH 3 DAYS Performed at [facility], [address] [place], [place] [address]    Report Status PENDING  Incomplete  Urine culture     Status: Abnormal   Collection Time: 04/25/19  9:42 AM   Specimen: Urine, Random  Result Value Ref Range Status   Specimen Description   Final    URINE, RANDOM Performed at [facility], [address] [place], [place] [address]    Special Requests   Final    NONE Performed at [facility], [address] [place], [place] [address]    Culture (A)  Final    <10,000 COLONIES/mL INSIGNIFICANT GROWTH Performed at [facility], [address] [place], [place] [address]    Report Status 04/26/2019 FINAL  Final  Urine culture     Status: None   Collection Time: 04/25/19  9:22 PM   Specimen: In/Out Cath Urine  Result Value Ref Range Status   Specimen Description IN/OUT CATH URINE  Final   Special Requests NONE  Final   Culture   Final    NO GROWTH Performed at [facility], [address] [place], [place] [address]    Report Status 04/27/2019 FINAL  Final  Blood Culture (routine x 2)     Status: None (Preliminary result)   Collection Time: 04/25/19  9:45 PM   Specimen: BLOOD  Result Value Ref Range Status   Specimen Description BLOOD LEFT HAND  Final   Special Requests   Final    BOTTLES DRAWN AEROBIC AND ANAEROBIC Blood Culture adequate volume   Culture   Final    NO GROWTH 3 DAYS Performed at [facility], [address] [place], [place] [address]    Report  Status PENDING  Incomplete  Blood Culture (routine x 2)     Status: None (Preliminary result)   Collection Time: 04/25/19  9:49 PM   Specimen: BLOOD  Result Value Ref Range Status   Specimen Description BLOOD RIGHT HAND  Final   Special Requests   Final    BOTTLES DRAWN AEROBIC AND ANAEROBIC Blood Culture adequate volume   Culture   Final    NO GROWTH 3 DAYS Performed at [facility], [address] [place], [place] [address]    Report Status PENDING  Incomplete  Respiratory Panel by RT PCR (Flu A&B, Covid) - Nasopharyngeal Swab  Status: None   Collection Time: 04/25/19 10:40 PM   Specimen: Nasopharyngeal Swab  Result Value Ref Range Status   SARS Coronavirus 2 by RT PCR NEGATIVE NEGATIVE Final    Comment: (NOTE) SARS-CoV-2 target nucleic acids are NOT DETECTED. The SARS-CoV-2 RNA is generally detectable in upper respiratoy specimens during the acute phase of infection. The lowest concentration of SARS-CoV-2 viral copies this assay can detect is 131 copies/mL. A negative result does not preclude SARS-Cov-2 infection and should not be used as the sole basis for treatment or other patient management decisions. A negative result may occur with  improper specimen collection/handling, submission of specimen other than nasopharyngeal swab, presence of viral mutation(s) within the areas targeted by this assay, and inadequate number of viral copies (<131 copies/mL). A negative result must be combined with clinical observations, patient history, and epidemiological information. The expected result is Negative. Fact Sheet for Patients:  https://www.moore.com/ Fact Sheet for Healthcare Providers:  https://www.young.biz/ This test is not yet ap proved or cleared by the [place] FDA and  has been authorized for detection and/or diagnosis of SARS-CoV-2 by FDA under an Emergency Use Authorization (EUA). This EUA will remain  in effect  (meaning this test can be used) for the duration of the COVID-19 declaration under Section 564(b)(1) of the Act, 21 U.S.C. section 360bbb-3(b)(1), unless the authorization is terminated or revoked sooner.    Influenza A by PCR NEGATIVE NEGATIVE Final   Influenza B by PCR NEGATIVE NEGATIVE Final    Comment: (NOTE) The Xpert Xpress SARS-CoV-2/FLU/RSV assay is intended as an aid in  the diagnosis of influenza from Nasopharyngeal swab specimens and  should not be used as a sole basis for treatment. Nasal washings and  aspirates are unacceptable for Xpert Xpress SARS-CoV-2/FLU/RSV  testing. Fact Sheet for Patients: https://www.moore.com/ Fact Sheet for Healthcare Providers: https://www.young.biz/ This test is not yet approved or cleared by the [place] FDA and  has been authorized for detection and/or diagnosis of SARS-CoV-2 by  FDA under an Emergency Use Authorization (EUA). This EUA will remain  in effect (meaning this test can be used) for the duration of the  Covid-19 declaration under Section 564(b)(1) of the Act, 21  U.S.C. section 360bbb-3(b)(1), unless the authorization is  terminated or revoked. Performed at [facility], [address], [place], [place] [address]      Labs: Basic Metabolic Panel: Recent Labs  Lab 04/25/19 0808 04/25/19 2122 04/26/19 0508 04/27/19 0445 04/28/19 0532  NA 140 142 142 141 141  K 3.5 3.1* 3.4* 3.7 3.3*  CL 107 111 112* 112* 111  CO2 22 22 21* 21* 24  GLUCOSE 133* 113* 155* 101* 85  BUN 17 12 12 8  5*  CREATININE 0.78 1.00 0.93 0.72 0.77  CALCIUM 9.5 8.3* 8.2* 8.3* 8.5*  MG  --  1.7  --   --   --   PHOS  --  <1.0*  --   --   --    Liver Function Tests: Recent Labs  Lab 04/25/19 0808 04/25/19 2122 04/26/19 0508 04/27/19 0445 04/28/19 0532  AST 22 25 29 25 17   ALT 23 24 33 39 30  ALKPHOS 83 71 69 70 82  BILITOT 0.8 0.8 0.7 0.8 0.5  PROT 7.8 6.0* 5.5* 5.4* 5.3*  ALBUMIN 4.2 3.3*  2.9* 2.6* 2.7*   Recent Labs  Lab 04/25/19 0808 04/25/19 2122  LIPASE 40 34   No results for input(s): AMMONIA in the last 168 hours.  CBC: Recent Labs  Lab 04/25/19 0808 04/25/19 2122 04/26/19 0508 04/27/19 0445 04/28/19 0532  WBC 15.8* 5.0 10.8* 5.7 4.3  NEUTROABS  --  3.8 9.9*  --   --   HGB 14.4 12.8 11.5* 11.1* 11.4*  HCT 45.0 40.5 37.0 34.5* 35.3*  MCV 83.2 87.3 87.3 85.8 84.7  PLT 229 151 140* 129* 150   Cardiac Enzymes: No results for input(s): CKTOTAL, CKMB, CKMBINDEX, TROPONINI in the last 168 hours. BNP: BNP (last 3 results) No results for input(s): BNP in the last 8760 hours.  ProBNP (last 3 results) No results for input(s): PROBNP in the last 8760 hours.  CBG: No results for input(s): GLUCAP in the last 168 hours.     Signed:  Nita Sells MD   [facility] 04/28/2019, 2:00 PM

## 2019-04-30 LAB — CULTURE, BLOOD (ROUTINE X 2)
Culture: NO GROWTH
Culture: NO GROWTH
Culture: NO GROWTH
Culture: NO GROWTH
Special Requests: ADEQUATE
Special Requests: ADEQUATE

## 2019-05-02 ENCOUNTER — Other Ambulatory Visit: Payer: Self-pay

## 2019-05-02 DIAGNOSIS — K8689 Other specified diseases of pancreas: Secondary | ICD-10-CM

## 2019-05-02 NOTE — Telephone Encounter (Signed)
Dr Jacobs please advise  

## 2019-05-02 NOTE — Telephone Encounter (Signed)
The pt returned call and was scheduled for 2/11 with Dr Christella Hartigan but I was told only category 1 can be scheduled.

## 2019-05-02 NOTE — Telephone Encounter (Signed)
Pt called looking to schedule EUS. Pls call her.

## 2019-05-02 NOTE — Telephone Encounter (Signed)
I reviewed her case.  Should stay priority level 2 and so we will have to wait to perform this test for as soon as hospital endo will allow.   Please tell her to call if new, concerning GI symptoms.  Please put her in reminder system to reschedule the case for a soon as it is allowed.

## 2019-05-02 NOTE — Telephone Encounter (Signed)
EUS scheduled, pt instructed and medications reviewed.  Patient instructions available in My Chart per pt request.    Patient to call with any questions or concerns.

## 2019-05-02 NOTE — Telephone Encounter (Signed)
Left message on machine to call back  

## 2019-05-02 NOTE — Telephone Encounter (Signed)
Dr Christella Hartigan I spoke with karen at Endo and she allowed me to schedule.  I will let Sheri follow up on the rule for scheduling.

## 2019-05-07 ENCOUNTER — Telehealth (INDEPENDENT_AMBULATORY_CARE_PROVIDER_SITE_OTHER): Payer: BC Managed Care – PPO | Admitting: Family Medicine

## 2019-05-07 ENCOUNTER — Encounter: Payer: Self-pay | Admitting: Family Medicine

## 2019-05-07 ENCOUNTER — Other Ambulatory Visit: Payer: Self-pay

## 2019-05-07 VITALS — BP 132/87

## 2019-05-07 DIAGNOSIS — Z09 Encounter for follow-up examination after completed treatment for conditions other than malignant neoplasm: Secondary | ICD-10-CM

## 2019-05-07 DIAGNOSIS — E876 Hypokalemia: Secondary | ICD-10-CM

## 2019-05-07 DIAGNOSIS — A419 Sepsis, unspecified organism: Secondary | ICD-10-CM | POA: Diagnosis not present

## 2019-05-07 DIAGNOSIS — D500 Iron deficiency anemia secondary to blood loss (chronic): Secondary | ICD-10-CM | POA: Diagnosis not present

## 2019-05-07 DIAGNOSIS — K8689 Other specified diseases of pancreas: Secondary | ICD-10-CM

## 2019-05-07 NOTE — Progress Notes (Signed)
CC: Hospital f/u- 4 days in hospital for pneumonia, fever and cyst on pancreas found by mistake.  No recent weight taken but bp noted in vitals.  No travel outside the [place] or [place] in the past 3 weeks. No refills needed at this time.

## 2019-05-07 NOTE — Progress Notes (Signed)
Telemedicine Encounter- SOAP NOTE Established Patient  This telephone encounter was conducted with the patient's (or proxy's) verbal consent via audio telecommunications: yes/no: Yes Patient was instructed to have this encounter in a suitably private space; and to only have persons present to whom they give permission to participate. In addition, patient identity was confirmed by use of name plus two identifiers (DOB and address).  I discussed the limitations, risks, security and privacy concerns of performing an evaluation and management service by telephone and the availability of in person appointments. I also discussed with the patient that there may be a patient responsible charge related to this service. The patient expressed understanding and agreed to proceed.  I spent a total of TIME; 0 MIN TO 60 MIN: 25 minutes talking with the patient or their proxy.  Chief Complaint  Patient presents with  . hosptial followup    pneumonia, cyst on pancreas, and fever. see ED notes from 04/18/2019    Subjective   Sylvia Cantrell is a 44 y.o. established patient. Telephone visit today for  HPI   Patient will get a EUS biopsy at [facility] for pancreatic mass in the tail and a small lesion in the head of the pancreas.  She is asymptomatic. This was found as an incidentaloma.   She is taking dayquil to help her symptoms She reports that her breathing is getting short and she has some chest tightness She feels like she cannot do work or laundry without having to sit down and rest She did not work last week   She was on [place] and Cefepime for sepsis due to PNA  She was discharged home with [place] She has a history of asthma The recommendation at the time of discharge is to follow up CXR in one month  Her hemoglobin was 11 and now needs to be rechecked  Hypokalemia She was advised to take lasix every other day  She was out of work from 04/25/19 to 05/07/2019 She states that she was out  of work and went back to work today 05/07/19   Patient Active Problem List   Diagnosis Date Noted  . Sepsis due to undetermined organism ([place]) 04/26/2019  . Hypophosphatemia 04/26/2019  . Hypokalemia 04/26/2019  . Pancreatic mass 04/26/2019  . Elevated blood pressure reading 09/12/2018  . Mild intermittent asthma without complication 09/47/0962  . Anemia 05/02/2018  . Neck pain 06/21/2017  . Anterior pleuritic pain 06/21/2017  . Iron deficiency anemia due to chronic blood loss 02/28/2017  . Situational anxiety 11/17/2016  . Menorrhagia with regular cycle 08/11/2016  . Anemia due to chronic blood loss 08/11/2016  . NEPHROLITHIASIS, HX OF 02/15/2010  . ALLERGIC RHINITIS 02/11/2010  . Asthma 02/11/2010    Past Medical History:  Diagnosis Date  . Allergy   . Anemia   . Anxiety   . Asthma   . Chronic headache   . Depression   . Hypertension   . IBS (irritable bowel syndrome)   . Kidney stones   . Nephrolithiasis   . Obesity   . Seizures ([place])     Current Outpatient Medications  Medication Sig Dispense Refill  . albuterol (VENTOLIN HFA) 108 (90 Base) MCG/ACT inhaler Inhale 2 puffs into the lungs every 6 (six) hours as needed for wheezing or shortness of breath. 18 g 2  . amLODipine (NORVASC) 5 MG tablet Take 1 tablet (5 mg total) by mouth daily. 90 tablet 1  . escitalopram (LEXAPRO) 5 MG tablet Take 1  tablet (5 mg total) by mouth daily. 30 tablet 2  . fluticasone (FLONASE) 50 MCG/ACT nasal spray Place 2 sprays into both nostrils daily. (Patient taking differently: Place 2 sprays into both nostrils daily as needed for allergies. ) 16 g 6  . Fluticasone Furoate-Vilanterol (BREO ELLIPTA) 100-25 MCG/INH AEPB Inhale 1 puff into the lungs daily.     . ondansetron (ZOFRAN-ODT) 4 MG disintegrating tablet Take 1 tablet (4 mg total) by mouth every 8 (eight) hours as needed for nausea or vomiting. 20 tablet 0  . furosemide (LASIX) 40 MG tablet Take 1 tablet (40 mg total) by mouth every  other day for 6 days. 3 tablet 0  . levofloxacin (LEVAQUIN) 500 MG tablet Take 1 tablet (500 mg total) by mouth daily. (Patient not taking: Reported on 05/07/2019) 4 tablet 0  . LORazepam (ATIVAN) 0.5 MG tablet Take 1/2-1 tab PO BID prn (Patient not taking: Reported on 05/07/2019) 30 tablet 0  . potassium chloride SA (KLOR-CON) 20 MEQ tablet Take 2 tablets (40 mEq total) by mouth daily. (Patient not taking: Reported on 05/07/2019) 7 tablet 0   No current facility-administered medications for this visit.    Allergies  Allergen Reactions  . Amoxicillin     Hives with first dose  . Codeine     REACTION: itching    Social History   Socioeconomic History  . Marital status: Legally Separated    Spouse name: Not on file  . Number of children: Not on file  . Years of education: Not on file  . Highest education level: Not on file  Occupational History  . Not on file  Tobacco Use  . Smoking status: Never Smoker  . Smokeless tobacco: Never Used  Substance and Sexual Activity  . Alcohol use: No    Alcohol/week: 0.0 standard drinks  . Drug use: No  . Sexual activity: Never  Other Topics Concern  . Not on file  Social History Narrative  . Not on file   Social Determinants of Health   Financial Resource Strain:   . Difficulty of Paying Living Expenses: Not on file  Food Insecurity:   . Worried About Charity fundraiser in the Last Year: Not on file  . Ran Out of Food in the Last Year: Not on file  Transportation Needs:   . Lack of Transportation (Medical): Not on file  . Lack of Transportation (Non-Medical): Not on file  Physical Activity:   . Days of Exercise per Week: Not on file  . Minutes of Exercise per Session: Not on file  Stress:   . Feeling of Stress : Not on file  Social Connections:   . Frequency of Communication with Friends and Family: Not on file  . Frequency of Social Gatherings with Friends and Family: Not on file  . Attends Religious Services: Not on file  .  Active Member of Clubs or Organizations: Not on file  . Attends Archivist Meetings: Not on file  . Marital Status: Not on file  Intimate Partner Violence:   . Fear of Current or Ex-Partner: Not on file  . Emotionally Abused: Not on file  . Physically Abused: Not on file  . Sexually Abused: Not on file    ROS Review of Systems  Constitutional: positive for activity change, appetite change, chills and fever.  HENT: Negative for congestion, nosebleeds, trouble swallowing and voice change.   Respiratory: see hpi Gastrointestinal: Negative for diarrhea, nausea and vomiting.  Genitourinary: Negative for  difficulty urinating, dysuria, flank pain and hematuria.  Musculoskeletal: Negative for back pain, joint swelling and neck pain.  Neurological: Negative for dizziness, speech difficulty, light-headedness and numbness.  See HPI. All other review of systems negative.   Objective   Vitals as reported by the patient: Today's Vitals   05/07/19 1436  BP: 132/87    FINDINGS: The heart size and mediastinal contours are within normal limits. Both lungs are clear. No pneumothorax or pleural effusion is noted. The visualized skeletal structures are unremarkable.  IMPRESSION: No active cardiopulmonary disease.   Electronically Signed   By: Marijo Conception, M.D.   On: 06/21/2017 16:19    Mayari was seen today for hosptial followup.  Diagnoses and all orders for this visit:  Sepsis due to undetermined organism [facility])- likely due to anemia  Pt completed levaquin  Iron deficiency anemia due to chronic blood loss- hgb 11 at the time of discharge -     CBC with Differential/Platelet; Future  Pancreatic mass- pt scheduled for EUS  Hypokalemia - repleted in the hospital, will follow up  -     CMP14+EGFR; Future  Hospital discharge follow-up  - reviewed notes and discharge summary   Patient to follow up with [facility] for re-evaluation Will give  work note Pt will get repeat labs   I discussed the assessment and treatment plan with the patient. The patient was provided an opportunity to ask questions and all were answered. The patient agreed with the plan and demonstrated an understanding of the instructions.   The patient was advised to call back or seek an in-person evaluation if the symptoms worsen or if the condition fails to improve as anticipated.  I provided 25 minutes of non-face-to-face time during this encounter.  Forrest Moron, MD  Primary Care at [facility]

## 2019-05-07 NOTE — Patient Instructions (Signed)
° ° ° °  If you have lab work done today you will be contacted with your lab results within the next 2 weeks.  If you have not heard from us then please contact us. The fastest way to get your results is to register for My Chart. ° ° °IF you received an x-ray today, you will receive an invoice from [facility]. Please contact [facility] at [phone] with questions or concerns regarding your invoice.  ° °IF you received labwork today, you will receive an invoice from [facility]. Please contact [facility] at [phone] with questions or concerns regarding your invoice.  ° °Our billing staff will not be able to assist you with questions regarding bills from these companies. ° °You will be contacted with the lab results as soon as they are available. The fastest way to get your results is to activate your My Chart account. Instructions are located on the last page of this paperwork. If you have not heard from us regarding the results in 2 weeks, please contact this office. °  ° ° ° °

## 2019-05-08 ENCOUNTER — Ambulatory Visit: Admission: RE | Admit: 2019-05-08 | Payer: BC Managed Care – PPO | Source: Ambulatory Visit | Admitting: *Deleted

## 2019-05-08 ENCOUNTER — Ambulatory Visit: Payer: Self-pay

## 2019-05-08 ENCOUNTER — Ambulatory Visit
Admission: RE | Admit: 2019-05-08 | Discharge: 2019-05-08 | Disposition: A | Discharge status: Home / Self Care | Payer: BC Managed Care – PPO | Source: Ambulatory Visit | Attending: Internal Medicine | Admitting: Internal Medicine

## 2019-05-08 ENCOUNTER — Ambulatory Visit (INDEPENDENT_AMBULATORY_CARE_PROVIDER_SITE_OTHER): Payer: BC Managed Care – PPO | Admitting: Internal Medicine

## 2019-05-08 VITALS — BP 134/81 | HR 74 | Temp 98.7°F | Wt 247.4 lb

## 2019-05-08 DIAGNOSIS — R0602 Shortness of breath: Secondary | ICD-10-CM

## 2019-05-08 DIAGNOSIS — R5383 Other fatigue: Secondary | ICD-10-CM

## 2019-05-08 DIAGNOSIS — J0120 Acute ethmoidal sinusitis, unspecified: Secondary | ICD-10-CM

## 2019-05-08 MED ORDER — PREDNISONE 10 MG (21) PO TBPK: ORAL_TABLET | ORAL | 0 refills | Status: DC

## 2019-05-08 NOTE — Progress Notes (Signed)
This visit occurred during the SARS-CoV-2 public health emergency.  Safety protocols were in place, including screening questions prior to the visit, additional usage of staff PPE, and extensive cleaning of exam room while observing appropriate contact time as indicated for disinfecting solutions.  Subjective:     Patient ID: Sylvia Cantrell , female    DOB: 07/25/1975 , 44 y.o.   MRN: [id number]   Chief Complaint  Patient presents with  . Chest Pain  . Cough  . Nasal Congestion  . cold    can not get warm  . Pneumonia  . Fatigue    HPI Was hospitalized 1/13- 16th with fever of 106. Earlier had gone to ER with fever of 102 and dx with UTI. While admited found she had sepsis and L lobar pneumonia. Has had 2 negative covid tests.  Was sent home with lasix and antibtiotics x 7 days and felt better x 2 days. Then 1/23 the SOB and cough came back. She had been feeling weak all last week. On 1/25 she worked only 1/2 day due to fatigue. Cough is not non productive. PND started 4 days ago, and is of clear color to light yellow. Has been having chest pains on her side ribs and sides of breast, but has different pain on L back from her pneumonia. Temp has been up to 99.1 4 days ago. None today. She has only used her ventolin inhaler twice since out. + diarrhea 2x/d x 4 days, prior to this 3-4 times. Has been eating rich foods in potasium.   Past Medical History:  Diagnosis Date  . Allergy   . Anemia   . Anxiety   . Asthma   . Chronic headache   . Depression   . Hypertension   . IBS (irritable bowel syndrome)   . Kidney stones   . Nephrolithiasis   . Obesity   . Seizures ([place])      Family History  Problem Relation Age of Onset  . Hypertension Mother   . Hyperthyroidism Mother   . Heart failure Father   . Heart failure Maternal Grandmother   . Hypertension Maternal Grandmother   . Kidney disease Maternal Grandmother   . Heart failure Paternal Grandfather      Current  Outpatient Medications:  .  albuterol (VENTOLIN HFA) 108 (90 Base) MCG/ACT inhaler, Inhale 2 puffs into the lungs every 6 (six) hours as needed for wheezing or shortness of breath., Disp: 18 g, Rfl: 2 .  amLODipine (NORVASC) 5 MG tablet, Take 1 tablet (5 mg total) by mouth daily., Disp: 90 tablet, Rfl: 1 .  escitalopram (LEXAPRO) 5 MG tablet, Take 1 tablet (5 mg total) by mouth daily., Disp: 30 tablet, Rfl: 2 .  fluticasone (FLONASE) 50 MCG/ACT nasal spray, Place 2 sprays into both nostrils daily. (Patient taking differently: Place 2 sprays into both nostrils daily as needed for allergies. ), Disp: 16 g, Rfl: 6 .  Fluticasone Furoate-Vilanterol (BREO ELLIPTA) 100-25 MCG/INH AEPB, Inhale 1 puff into the lungs daily. , Disp: , Rfl:  .  furosemide (LASIX) 40 MG tablet, Take 1 tablet (40 mg total) by mouth every other day for 6 days., Disp: 3 tablet, Rfl: 0 .  levofloxacin (LEVAQUIN) 500 MG tablet, Take 1 tablet (500 mg total) by mouth daily. (Patient not taking: Reported on 05/07/2019), Disp: 4 tablet, Rfl: 0 .  LORazepam (ATIVAN) 0.5 MG tablet, Take 1/2-1 tab PO BID prn (Patient not taking: Reported on 05/07/2019), Disp: 30 tablet, Rfl: 0 .  ondansetron (ZOFRAN-ODT) 4 MG disintegrating tablet, Take 1 tablet (4 mg total) by mouth every 8 (eight) hours as needed for nausea or vomiting., Disp: 20 tablet, Rfl: 0 .  potassium chloride SA (KLOR-CON) 20 MEQ tablet, Take 2 tablets (40 mEq total) by mouth daily. (Patient not taking: Reported on 05/07/2019), Disp: 7 tablet, Rfl: 0   Allergies  Allergen Reactions  . Amoxicillin     Hives with first dose  . Codeine     REACTION: itching     Review of Systems  + fatigue, poor appetite, nausea and is taking Zofran at least once a day, not interested in meats and proteins since sickness x 3 weeks now.On occasion has felt light headed and worse when exausted. Marland Kitchen Has irregular periods and denies being sexually acitive or concerned for pregnancy Today's Vitals    05/08/19 1753  BP: 134/81  Pulse: 74  Temp: 98.7 F (37.1 C)  TempSrc: Oral  SpO2: 100%  Weight: 247 lb 6.4 oz (112.2 kg)   Body mass index is 39.93 kg/m.   Objective:  Physical Exam   Constitutional: She is oriented to person, place, and time. She appears well-developed and well-nourished. No distress.  HENT:  Moderate  L ethmoid sinus is tender Head: Normocephalic and atraumatic.  Right Ear: External ear normal.  Left Ear: External ear normal.  Nose: Nose normal.  Eyes: Conjunctivae are normal. Right eye exhibits no discharge. Left eye exhibits no discharge. No scleral icterus.  Neck: Neck supple. No thyromegaly present.  No carotid bruits bilaterally  Cardiovascular: Normal rate and regular rhythm.  No murmur heard. Pulmonary/Chest: Effort normal and breath sounds normal. No respiratory distress.  Musculoskeletal: Normal range of motion. She exhibits no edema.  Lymphadenopathy:    She has no cervical adenopathy.  Neurological: She is alert and oriented to person, place, and time.  Skin: Skin is warm and dry. Capillary refill takes less than 2 seconds. No rash noted. She is not diaphoretic.  Psychiatric: She has a normal mood and affect. Her behavior is normal. Judgment and thought content normal.  Nursing note reviewed.     Assessment And Plan:  1. Other fatigue- PCP has ordered this and has not done it, so we drew it today. Needs to call PCP tomorrow to get her lab results. Needs to take off the rest of the week. See instructions.  - CBC With Differential - COMPLETE METABOLIC PANEL WITH GFR  2. SOB (shortness of breath)- with activity with resolved pneumonia. Advised to use her Ventolin inhaler q 4h for 5-7 days - DG Chest 2 View  3. Acute non-recurrent ethmoidal sinusitis- acute. Advised to use Flonase and do saline nose rinses as noted  in instructions.s    4- Diarrhea- mild and improving.  Ouita Nish RODRIGUEZ-SOUTHWORTH, PA-C    THE PATIENT IS ENCOURAGED TO  PRACTICE SOCIAL DISTANCING DUE TO THE COVID-19 PANDEMIC.

## 2019-05-08 NOTE — Patient Instructions (Addendum)
Use your ventolin inhaler  2 puffs every 4 hours for 5-7 days and if not improving in 48 hours, come back here, but call for appointment.  Take the prednisone as directed. Use saline rinses twice a day for 5 days to clean out your sinuses, and use Flonase 2 sprays each nostril x 7 days a couple of hours after rinses. Avoiding saline rinses before bed time and do not use tap water, only distilled water.  Take the rest of the week off and rest, but dont stay in bed, try to walk every hour a little and gradually increase your endurance,  Make an appointment with your family Dr for follow up on Friday.  Sinusitis, Adult Sinusitis is soreness and swelling (inflammation) of your sinuses. Sinuses are hollow spaces in the bones around your face. They are located:  Around your eyes.  In the middle of your forehead.  Behind your nose.  In your cheekbones. Your sinuses and nasal passages are lined with a fluid called mucus. Mucus drains out of your sinuses. Swelling can trap mucus in your sinuses. This lets germs (bacteria, virus, or fungus) grow, which leads to infection. Most of the time, this condition is caused by a virus. What are the causes? This condition is caused by:  Allergies.  Asthma.  Germs.  Things that block your nose or sinuses.  Growths in the nose (nasal polyps).  Chemicals or irritants in the air.  Fungus (rare). What increases the risk? You are more likely to develop this condition if:  You have a weak body defense system (immune system).  You do a lot of swimming or diving.  You use nasal sprays too much.  You smoke. What are the signs or symptoms? The main symptoms of this condition are pain and a feeling of pressure around the sinuses. Other symptoms include:  Stuffy nose (congestion).  Runny nose (drainage).  Swelling and warmth in the sinuses.  Headache.  Toothache.  A cough that may get worse at night.  Mucus that collects in the throat or the  back of the nose (postnasal drip).  Being unable to smell and taste.  Being very tired (fatigue).  A fever.  Sore throat.  Bad breath. How is this diagnosed? This condition is diagnosed based on:  Your symptoms.  Your medical history.  A physical exam.  Tests to find out if your condition is short-term (acute) or long-term (chronic). Your doctor may: ? Check your nose for growths (polyps). ? Check your sinuses using a tool that has a light (endoscope). ? Check for allergies or germs. ? Do imaging tests, such as an MRI or CT scan. How is this treated? Treatment for this condition depends on the cause and whether it is short-term or long-term.  If caused by a virus, your symptoms should go away on their own within 10 days. You may be given medicines to relieve symptoms. They include: ? Medicines that shrink swollen tissue in the nose. ? Medicines that treat allergies (antihistamines). ? A spray that treats swelling of the nostrils. ? Rinses that help get rid of thick mucus in your nose (nasal saline washes).  If caused by bacteria, your doctor may wait to see if you will get better without treatment. You may be given antibiotic medicine if you have: ? A very bad infection. ? A weak body defense system.  If caused by growths in the nose, you may need to have surgery. Follow these instructions at home: Medicines  Take, use, or apply over-the-counter and prescription medicines only as told by your doctor. These may include nasal sprays.  If you were prescribed an antibiotic medicine, take it as told by your doctor. Do not stop taking the antibiotic even if you start to feel better. Hydrate and humidify   Drink enough water to keep your pee (urine) pale yellow.  Use a cool mist humidifier to keep the humidity level in your home above 50%.  Breathe in steam for 10-15 minutes, 3-4 times a day, or as told by your doctor. You can do this in the bathroom while a hot shower  is running.  Try not to spend time in cool or dry air. Rest  Rest as much as you can.  Sleep with your head raised (elevated).  Make sure you get enough sleep each night. General instructions   Put a warm, moist washcloth on your face 3-4 times a day, or as often as told by your doctor. This will help with discomfort.  Wash your hands often with soap and water. If there is no soap and water, use hand sanitizer.  Do not smoke. Avoid being around people who are smoking (secondhand smoke).  Keep all follow-up visits as told by your doctor. This is important. Contact a doctor if:  You have a fever.  Your symptoms get worse.  Your symptoms do not get better within 10 days. Get help right away if:  You have a very bad headache.  You cannot stop throwing up (vomiting).  You have very bad pain or swelling around your face or eyes.  You have trouble seeing.  You feel confused.  Your neck is stiff.  You have trouble breathing. Summary  Sinusitis is swelling of your sinuses. Sinuses are hollow spaces in the bones around your face.  This condition is caused by tissues in your nose that become inflamed or swollen. This traps germs. These can lead to infection.  If you were prescribed an antibiotic medicine, take it as told by your doctor. Do not stop taking it even if you start to feel better.  Keep all follow-up visits as told by your doctor. This is important. This information is not intended to replace advice given to you by your health care provider. Make sure you discuss any questions you have with your health care provider. Document Revised: 08/29/2017 Document Reviewed: 08/29/2017 Elsevier Patient Education  2020 [employer].

## 2019-05-09 LAB — CBC WITH DIFFERENTIAL
Basophils Absolute: 0.1 10*3/uL (ref 0.0–0.2)
Basos: 1 %
EOS (ABSOLUTE): 0.2 10*3/uL (ref 0.0–0.4)
Eos: 2 %
Hematocrit: 44.8 % (ref 34.0–46.6)
Hemoglobin: 14.4 g/dL (ref 11.1–15.9)
Immature Grans (Abs): 0 10*3/uL (ref 0.0–0.1)
Immature Granulocytes: 0 %
Lymphocytes Absolute: 3.4 10*3/uL — ABNORMAL HIGH (ref 0.7–3.1)
Lymphs: 36 %
MCH: 27.4 pg (ref 26.6–33.0)
MCHC: 32.1 g/dL (ref 31.5–35.7)
MCV: 85 fL (ref 79–97)
Monocytes Absolute: 0.6 10*3/uL (ref 0.1–0.9)
Monocytes: 6 %
Neutrophils Absolute: 5.2 10*3/uL (ref 1.4–7.0)
Neutrophils: 55 %
RBC: 5.26 x10E6/uL (ref 3.77–5.28)
RDW: 14.3 % (ref 11.7–15.4)
WBC: 9.4 10*3/uL (ref 3.4–10.8)

## 2019-05-15 ENCOUNTER — Ambulatory Visit: Payer: BC Managed Care – PPO | Admitting: Family Medicine

## 2019-05-15 ENCOUNTER — Other Ambulatory Visit: Payer: Self-pay

## 2019-05-15 ENCOUNTER — Encounter: Payer: Self-pay | Admitting: Family Medicine

## 2019-05-15 VITALS — BP 134/72 | HR 75 | Temp 98.9°F | Ht 66.0 in | Wt 248.8 lb

## 2019-05-15 DIAGNOSIS — R1032 Left lower quadrant pain: Secondary | ICD-10-CM | POA: Diagnosis not present

## 2019-05-15 DIAGNOSIS — Z7951 Long term (current) use of inhaled steroids: Secondary | ICD-10-CM | POA: Diagnosis not present

## 2019-05-15 DIAGNOSIS — K8689 Other specified diseases of pancreas: Secondary | ICD-10-CM | POA: Diagnosis not present

## 2019-05-15 DIAGNOSIS — Z6838 Body mass index (BMI) 38.0-38.9, adult: Secondary | ICD-10-CM | POA: Diagnosis not present

## 2019-05-15 DIAGNOSIS — Z880 Allergy status to penicillin: Secondary | ICD-10-CM | POA: Diagnosis not present

## 2019-05-15 DIAGNOSIS — K5730 Diverticulosis of large intestine without perforation or abscess without bleeding: Secondary | ICD-10-CM | POA: Diagnosis not present

## 2019-05-15 DIAGNOSIS — Z79899 Other long term (current) drug therapy: Secondary | ICD-10-CM | POA: Diagnosis not present

## 2019-05-15 DIAGNOSIS — R519 Headache, unspecified: Secondary | ICD-10-CM | POA: Diagnosis not present

## 2019-05-15 DIAGNOSIS — Z8249 Family history of ischemic heart disease and other diseases of the circulatory system: Secondary | ICD-10-CM | POA: Diagnosis not present

## 2019-05-15 DIAGNOSIS — Z20822 Contact with and (suspected) exposure to covid-19: Secondary | ICD-10-CM | POA: Diagnosis not present

## 2019-05-15 DIAGNOSIS — J4520 Mild intermittent asthma, uncomplicated: Secondary | ICD-10-CM

## 2019-05-15 DIAGNOSIS — Z23 Encounter for immunization: Secondary | ICD-10-CM | POA: Diagnosis not present

## 2019-05-15 DIAGNOSIS — Z791 Long term (current) use of non-steroidal anti-inflammatories (NSAID): Secondary | ICD-10-CM | POA: Diagnosis not present

## 2019-05-15 DIAGNOSIS — G40909 Epilepsy, unspecified, not intractable, without status epilepticus: Secondary | ICD-10-CM | POA: Diagnosis not present

## 2019-05-15 DIAGNOSIS — J45909 Unspecified asthma, uncomplicated: Secondary | ICD-10-CM | POA: Diagnosis not present

## 2019-05-15 DIAGNOSIS — Z841 Family history of disorders of kidney and ureter: Secondary | ICD-10-CM | POA: Diagnosis not present

## 2019-05-15 DIAGNOSIS — I10 Essential (primary) hypertension: Secondary | ICD-10-CM | POA: Diagnosis not present

## 2019-05-15 DIAGNOSIS — M545 Low back pain: Secondary | ICD-10-CM | POA: Diagnosis not present

## 2019-05-15 DIAGNOSIS — Z01812 Encounter for preprocedural laboratory examination: Secondary | ICD-10-CM | POA: Diagnosis not present

## 2019-05-15 DIAGNOSIS — K862 Cyst of pancreas: Secondary | ICD-10-CM | POA: Diagnosis not present

## 2019-05-15 DIAGNOSIS — E876 Hypokalemia: Secondary | ICD-10-CM

## 2019-05-15 DIAGNOSIS — R846 Abnormal cytological findings in specimens from respiratory organs and thorax: Secondary | ICD-10-CM | POA: Diagnosis not present

## 2019-05-15 DIAGNOSIS — F419 Anxiety disorder, unspecified: Secondary | ICD-10-CM | POA: Diagnosis not present

## 2019-05-15 DIAGNOSIS — D500 Iron deficiency anemia secondary to blood loss (chronic): Secondary | ICD-10-CM | POA: Diagnosis not present

## 2019-05-15 DIAGNOSIS — F411 Generalized anxiety disorder: Secondary | ICD-10-CM | POA: Diagnosis not present

## 2019-05-15 DIAGNOSIS — Z87442 Personal history of urinary calculi: Secondary | ICD-10-CM | POA: Diagnosis not present

## 2019-05-15 DIAGNOSIS — Z885 Allergy status to narcotic agent status: Secondary | ICD-10-CM | POA: Diagnosis not present

## 2019-05-15 DIAGNOSIS — K869 Disease of pancreas, unspecified: Secondary | ICD-10-CM | POA: Diagnosis not present

## 2019-05-15 DIAGNOSIS — F329 Major depressive disorder, single episode, unspecified: Secondary | ICD-10-CM | POA: Diagnosis not present

## 2019-05-15 DIAGNOSIS — D649 Anemia, unspecified: Secondary | ICD-10-CM | POA: Diagnosis not present

## 2019-05-15 DIAGNOSIS — Z8349 Family history of other endocrine, nutritional and metabolic diseases: Secondary | ICD-10-CM | POA: Diagnosis not present

## 2019-05-15 DIAGNOSIS — Z7901 Long term (current) use of anticoagulants: Secondary | ICD-10-CM | POA: Diagnosis not present

## 2019-05-15 NOTE — Patient Instructions (Signed)
° ° ° °  If you have lab work done today you will be contacted with your lab results within the next 2 weeks.  If you have not heard from us then please contact us. The fastest way to get your results is to register for My Chart. ° ° °IF you received an x-ray today, you will receive an invoice from [facility]. Please contact [facility] at [phone] with questions or concerns regarding your invoice.  ° °IF you received labwork today, you will receive an invoice from [facility]. Please contact [facility] at [phone] with questions or concerns regarding your invoice.  ° °Our billing staff will not be able to assist you with questions regarding bills from these companies. ° °You will be contacted with the lab results as soon as they are available. The fastest way to get your results is to activate your My Chart account. Instructions are located on the last page of this paperwork. If you have not heard from us regarding the results in 2 weeks, please contact this office. °  ° ° ° °

## 2019-05-15 NOTE — Progress Notes (Signed)
2/2/20214:35 PM  Sylvia Cantrell 02/09/1976, 44 y.o., female [id number]  Chief Complaint  Patient presents with  . Hypertension    still having some breathing issues, testing neg for covid 2x. Did have a 4 day stay in the hospital due to sepsis    HPI:   Patient is a 44 y.o. female with past medical history significant for HTN, GERD, GAD, asthma who presents today for followup  Last OV May 07 2019 with Dr Creta Levin For hosp f/u - sepsis 2/2 PNA, covid neg Incidental finding of pancreatic mass - managed by GI, has EUS next week Saw pulm Jan 26th 2021 - given pred , much better since then Main asthma triggers - seasonal allergies and very cold weather  Overall she is doing well Returned to work yesterday, doing ok Anxiety controlled on current dose, she did take 10mg  tabs for several days about a week ago, but overall feels that 5mg  ok  Takes amlodipine 5mg  daily Denies any side effects  Has no acute concerns today   Lab Results  Component Value Date   WBC 9.4 05/08/2019   HGB 14.4 05/08/2019   HCT 44.8 05/08/2019   MCV 85 05/08/2019   PLT 150 04/28/2019   Lab Results  Component Value Date   CREATININE 0.77 04/28/2019   BUN 5 (L) 04/28/2019   NA 141 04/28/2019   K 3.3 (L) 04/28/2019   CL 111 04/28/2019   CO2 24 04/28/2019  not given KCL, has been increasing K rich foods  Lab Results  Component Value Date   ALT 30 04/28/2019   AST 17 04/28/2019   ALKPHOS 82 04/28/2019   BILITOT 0.5 04/28/2019    Depression screen PHQ 2/9 05/15/2019 05/07/2019 04/17/2019  Decreased Interest 0 0 0  Down, Depressed, Hopeless 0 0 0  PHQ - 2 Score 0 0 0  Altered sleeping - - -  Tired, decreased energy - - -  Change in appetite - - -  Feeling bad or failure about yourself  - - -  Trouble concentrating - - -  Moving slowly or fidgety/restless - - -  Suicidal thoughts - - -  PHQ-9 Score - - -  Difficult doing work/chores - - -    Fall Risk  05/15/2019 05/07/2019 04/17/2019  04/02/2019 03/20/2019  Falls in the past year? 0 0 0 0 0  Number falls in past yr: 0 0 0 0 0  Injury with Fall? 0 0 - 0 0  Follow up - - - Falls evaluation completed -     Allergies  Allergen Reactions  . Amoxicillin     Hives with first dose  . Codeine     REACTION: itching    Prior to Admission medications   Medication Sig Start Date End Date Taking? Authorizing Provider  albuterol (VENTOLIN HFA) 108 (90 Base) MCG/ACT inhaler Inhale 2 puffs into the lungs every 6 (six) hours as needed for wheezing or shortness of breath. 04/17/19  Yes 04/04/2019, MD  amLODipine (NORVASC) 5 MG tablet Take 1 tablet (5 mg total) by mouth daily. 04/17/19  Yes 06/15/19, MD  escitalopram (LEXAPRO) 5 MG tablet Take 1 tablet (5 mg total) by mouth daily. 04/17/19  Yes 06/15/19, MD  fluticasone [facility]) 50 MCG/ACT nasal spray Place 2 sprays into both nostrils daily. Patient taking differently: Place 2 sprays into both nostrils daily as needed for allergies.  10/27/17  Yes Myles Lipps, MD  LORazepam (ATIVAN) 0.5 MG  tablet Take 1/2-1 tab PO BID prn 09/12/18  Yes Corum, Rex Kras, MD  ondansetron (ZOFRAN-ODT) 4 MG disintegrating tablet Take 1 tablet (4 mg total) by mouth every 8 (eight) hours as needed for nausea or vomiting. 04/28/19  Yes Nita Sells, MD    Past Medical History:  Diagnosis Date  . Allergy   . Anemia   . Anxiety   . Asthma   . Chronic headache   . Depression   . Hypertension   . IBS (irritable bowel syndrome)   . Kidney stones   . Nephrolithiasis   . Obesity   . Seizures ([place])     Past Surgical History:  Procedure Laterality Date  . ABLATION    . BREAST SURGERY    . TONSILLECTOMY  1987    Social History   Tobacco Use  . Smoking status: Never Smoker  . Smokeless tobacco: Never Used  Substance Use Topics  . Alcohol use: No    Alcohol/week: 0.0 standard drinks    Family History  Problem Relation Age of Onset  . Hypertension Mother   .  Hyperthyroidism Mother   . Heart failure Father   . Heart failure Maternal Grandmother   . Hypertension Maternal Grandmother   . Kidney disease Maternal Grandmother   . Heart failure Paternal Grandfather     Review of Systems  Constitutional: Negative for chills and fever.  Respiratory: Negative for cough and shortness of breath.   Cardiovascular: Negative for chest pain, palpitations and leg swelling.  Gastrointestinal: Negative for abdominal pain, nausea and vomiting.     OBJECTIVE:  Today's Vitals   05/15/19 1558  BP: 134/72  Pulse: 75  Temp: 98.9 F (37.2 C)  SpO2: 98%  Weight: 248 lb 12.8 oz (112.9 kg)  Height: 5\' 6"  (1.676 m)   Body mass index is 40.16 kg/m.   Physical Exam Vitals and nursing note reviewed.  Constitutional:      Appearance: She is well-developed.  HENT:     Head: Normocephalic and atraumatic.     Mouth/Throat:     Pharynx: No oropharyngeal exudate.  Eyes:     General: No scleral icterus.    Conjunctiva/sclera: Conjunctivae normal.     Pupils: Pupils are equal, round, and reactive to light.  Cardiovascular:     Rate and Rhythm: Normal rate and regular rhythm.     Heart sounds: Normal heart sounds. No murmur. No friction rub. No gallop.   Pulmonary:     Effort: Pulmonary effort is normal.     Breath sounds: Normal breath sounds. No wheezing or rales.  Musculoskeletal:     Cervical back: Neck supple.  Skin:    General: Skin is warm and dry.  Neurological:     Mental Status: She is alert and oriented to person, place, and time.     No results found for this or any previous visit (from the past 24 hour(s)).  No results found.   ASSESSMENT and PLAN  1. Essential hypertension, benign Controlled. Continue current regime.   2. Hypokalemia - Basic Metabolic Panel - if still low will send supplement  3. Pancreatic mass Managed by GI  4. GAD (generalized anxiety disorder) Controlled. Continue current regime.   5. Mild  intermittent asthma without complication Controlled. Continue current regime.   Return in about 3 months (around 08/12/2019).    Rutherford Guys, MD [facility] [place], [place] [address] Ph.  [phone] Fax [phone]

## 2019-05-16 LAB — BASIC METABOLIC PANEL
BUN/Creatinine Ratio: 21 (ref 9–23)
BUN: 18 mg/dL (ref 6–24)
CO2: 23 mmol/L (ref 20–29)
Calcium: 9.6 mg/dL (ref 8.7–10.2)
Chloride: 105 mmol/L (ref 96–106)
Creatinine, Ser: 0.85 mg/dL (ref 0.57–1.00)
GFR calc Af Amer: 97 mL/min/{1.73_m2} (ref >59)
GFR calc non Af Amer: 84 mL/min/{1.73_m2} (ref >59)
Glucose: 147 mg/dL — ABNORMAL HIGH (ref 65–99)
Potassium: 3.6 mmol/L (ref 3.5–5.2)
Sodium: 143 mmol/L (ref 134–144)

## 2019-05-21 ENCOUNTER — Other Ambulatory Visit (HOSPITAL_COMMUNITY)
Admission: RE | Admit: 2019-05-21 | Discharge: 2019-05-21 | Disposition: A | Discharge status: Home / Self Care | Payer: BC Managed Care – PPO | Source: Ambulatory Visit | Attending: Gastroenterology | Admitting: Gastroenterology

## 2019-05-21 DIAGNOSIS — Z01812 Encounter for preprocedural laboratory examination: Secondary | ICD-10-CM | POA: Diagnosis not present

## 2019-05-21 DIAGNOSIS — Z20822 Contact with and (suspected) exposure to covid-19: Secondary | ICD-10-CM | POA: Diagnosis not present

## 2019-05-21 LAB — SARS CORONAVIRUS 2 (TAT 6-24 HRS): SARS Coronavirus 2: NEGATIVE

## 2019-05-23 NOTE — Progress Notes (Signed)
Spoke with patient, has quarantine without new symptoms, answered questions, patient cannot arrive until 745-800 due to childcare issues.

## 2019-05-24 ENCOUNTER — Other Ambulatory Visit: Payer: Self-pay

## 2019-05-24 ENCOUNTER — Ambulatory Visit (HOSPITAL_COMMUNITY): Payer: BC Managed Care – PPO | Admitting: Registered Nurse

## 2019-05-24 ENCOUNTER — Encounter (HOSPITAL_COMMUNITY)
Admission: RE | Disposition: A | Discharge status: Home / Self Care | Payer: Self-pay | Source: Home / Self Care | Attending: Gastroenterology

## 2019-05-24 ENCOUNTER — Ambulatory Visit (HOSPITAL_COMMUNITY)
Admission: RE | Admit: 2019-05-24 | Discharge: 2019-05-24 | Disposition: A | Discharge status: Home / Self Care | Payer: BC Managed Care – PPO | Attending: Gastroenterology | Admitting: Gastroenterology

## 2019-05-24 ENCOUNTER — Encounter (HOSPITAL_COMMUNITY): Payer: Self-pay | Admitting: Gastroenterology

## 2019-05-24 DIAGNOSIS — F329 Major depressive disorder, single episode, unspecified: Secondary | ICD-10-CM | POA: Diagnosis not present

## 2019-05-24 DIAGNOSIS — Z791 Long term (current) use of non-steroidal anti-inflammatories (NSAID): Secondary | ICD-10-CM | POA: Diagnosis not present

## 2019-05-24 DIAGNOSIS — Z7951 Long term (current) use of inhaled steroids: Secondary | ICD-10-CM | POA: Diagnosis not present

## 2019-05-24 DIAGNOSIS — J45909 Unspecified asthma, uncomplicated: Secondary | ICD-10-CM | POA: Insufficient documentation

## 2019-05-24 DIAGNOSIS — R1032 Left lower quadrant pain: Secondary | ICD-10-CM | POA: Diagnosis not present

## 2019-05-24 DIAGNOSIS — R630 Anorexia: Secondary | ICD-10-CM | POA: Insufficient documentation

## 2019-05-24 DIAGNOSIS — Z8249 Family history of ischemic heart disease and other diseases of the circulatory system: Secondary | ICD-10-CM | POA: Diagnosis not present

## 2019-05-24 DIAGNOSIS — Z880 Allergy status to penicillin: Secondary | ICD-10-CM | POA: Insufficient documentation

## 2019-05-24 DIAGNOSIS — R519 Headache, unspecified: Secondary | ICD-10-CM | POA: Insufficient documentation

## 2019-05-24 DIAGNOSIS — M545 Low back pain: Secondary | ICD-10-CM | POA: Insufficient documentation

## 2019-05-24 DIAGNOSIS — R846 Abnormal cytological findings in specimens from respiratory organs and thorax: Secondary | ICD-10-CM | POA: Diagnosis not present

## 2019-05-24 DIAGNOSIS — K862 Cyst of pancreas: Secondary | ICD-10-CM | POA: Diagnosis not present

## 2019-05-24 DIAGNOSIS — Z79899 Other long term (current) drug therapy: Secondary | ICD-10-CM | POA: Insufficient documentation

## 2019-05-24 DIAGNOSIS — Z7901 Long term (current) use of anticoagulants: Secondary | ICD-10-CM | POA: Diagnosis not present

## 2019-05-24 DIAGNOSIS — K5730 Diverticulosis of large intestine without perforation or abscess without bleeding: Secondary | ICD-10-CM | POA: Diagnosis not present

## 2019-05-24 DIAGNOSIS — Z8349 Family history of other endocrine, nutritional and metabolic diseases: Secondary | ICD-10-CM | POA: Insufficient documentation

## 2019-05-24 DIAGNOSIS — K8689 Other specified diseases of pancreas: Secondary | ICD-10-CM

## 2019-05-24 DIAGNOSIS — F419 Anxiety disorder, unspecified: Secondary | ICD-10-CM | POA: Insufficient documentation

## 2019-05-24 DIAGNOSIS — D649 Anemia, unspecified: Secondary | ICD-10-CM | POA: Diagnosis not present

## 2019-05-24 DIAGNOSIS — K869 Disease of pancreas, unspecified: Secondary | ICD-10-CM | POA: Insufficient documentation

## 2019-05-24 DIAGNOSIS — G40909 Epilepsy, unspecified, not intractable, without status epilepticus: Secondary | ICD-10-CM | POA: Diagnosis not present

## 2019-05-24 DIAGNOSIS — Z87442 Personal history of urinary calculi: Secondary | ICD-10-CM | POA: Insufficient documentation

## 2019-05-24 DIAGNOSIS — K580 Irritable bowel syndrome with diarrhea: Secondary | ICD-10-CM | POA: Insufficient documentation

## 2019-05-24 DIAGNOSIS — Z841 Family history of disorders of kidney and ureter: Secondary | ICD-10-CM | POA: Insufficient documentation

## 2019-05-24 DIAGNOSIS — Z6838 Body mass index (BMI) 38.0-38.9, adult: Secondary | ICD-10-CM | POA: Insufficient documentation

## 2019-05-24 DIAGNOSIS — Z885 Allergy status to narcotic agent status: Secondary | ICD-10-CM | POA: Diagnosis not present

## 2019-05-24 DIAGNOSIS — R197 Diarrhea, unspecified: Secondary | ICD-10-CM | POA: Insufficient documentation

## 2019-05-24 DIAGNOSIS — R319 Hematuria, unspecified: Secondary | ICD-10-CM | POA: Insufficient documentation

## 2019-05-24 DIAGNOSIS — I10 Essential (primary) hypertension: Secondary | ICD-10-CM | POA: Insufficient documentation

## 2019-05-24 DIAGNOSIS — R933 Abnormal findings on diagnostic imaging of other parts of digestive tract: Secondary | ICD-10-CM | POA: Insufficient documentation

## 2019-05-24 HISTORY — PX: FINE NEEDLE ASPIRATION: SHX5430

## 2019-05-24 HISTORY — PX: ESOPHAGOGASTRODUODENOSCOPY (EGD) WITH PROPOFOL: SHX5813

## 2019-05-24 HISTORY — PX: EUS: SHX5427

## 2019-05-24 SURGERY — UPPER ENDOSCOPIC ULTRASOUND (EUS) RADIAL
Anesthesia: Monitor Anesthesia Care

## 2019-05-24 MED ORDER — GLYCOPYRROLATE 0.2 MG/ML IJ SOLN
INTRAMUSCULAR | Status: DC | PRN
  Administered 2019-05-24 (×2): .1 mg via INTRAVENOUS

## 2019-05-24 MED ORDER — PROPOFOL 500 MG/50ML IV EMUL
INTRAVENOUS | Status: DC | PRN
  Administered 2019-05-24: 350 ug/kg/min via INTRAVENOUS

## 2019-05-24 MED ORDER — CIPROFLOXACIN IN D5W 400 MG/200ML IV SOLN
INTRAVENOUS | Status: AC
  Filled 2019-05-24: qty 200

## 2019-05-24 MED ORDER — PROPOFOL 1000 MG/100ML IV EMUL
INTRAVENOUS | Status: AC
  Filled 2019-05-24: qty 100

## 2019-05-24 MED ORDER — LIDOCAINE HCL (CARDIAC) PF 100 MG/5ML IV SOSY
PREFILLED_SYRINGE | INTRAVENOUS | Status: DC | PRN
  Administered 2019-05-24: 100 mg via INTRAVENOUS

## 2019-05-24 MED ORDER — PROPOFOL 500 MG/50ML IV EMUL
INTRAVENOUS | Status: AC
  Filled 2019-05-24: qty 50

## 2019-05-24 MED ORDER — SODIUM CHLORIDE 0.9 % IV SOLN: INTRAVENOUS | Status: DC

## 2019-05-24 MED ORDER — ALBUTEROL SULFATE HFA 108 (90 BASE) MCG/ACT IN AERS
INHALATION_SPRAY | RESPIRATORY_TRACT | Status: DC | PRN
  Administered 2019-05-24: 3 via RESPIRATORY_TRACT

## 2019-05-24 MED ORDER — LACTATED RINGERS IV SOLN: INTRAVENOUS | Status: DC | PRN

## 2019-05-24 MED ORDER — PROPOFOL 10 MG/ML IV BOLUS
INTRAVENOUS | Status: AC
  Filled 2019-05-24: qty 20

## 2019-05-24 MED ORDER — LACTATED RINGERS IV SOLN
INTRAVENOUS | Status: DC
  Administered 2019-05-24: 1000 mL via INTRAVENOUS

## 2019-05-24 MED ORDER — PROPOFOL 10 MG/ML IV BOLUS
INTRAVENOUS | Status: AC
  Filled 2019-05-24: qty 40

## 2019-05-24 MED ORDER — LABETALOL HCL 5 MG/ML IV SOLN
INTRAVENOUS | Status: DC | PRN
  Administered 2019-05-24 (×2): 2.5 mg via INTRAVENOUS

## 2019-05-24 SURGICAL SUPPLY — 15 items

## 2019-05-24 NOTE — Discharge Instructions (Signed)
YOU HAD AN ENDOSCOPIC PROCEDURE TODAY: Refer to the procedure report and other information in the discharge instructions given to you for any specific questions about what was found during the examination. If this information does not answer your questions, please call [place] office at [phone] to clarify.   YOU SHOULD EXPECT: Some feelings of bloating in the abdomen. Passage of more gas than usual. Walking can help get rid of the air that was put into your GI tract during the procedure and reduce the bloating. If you had a lower endoscopy (such as a colonoscopy or flexible sigmoidoscopy) you may notice spotting of blood in your stool or on the toilet paper. Some abdominal soreness may be present for a day or two, also.  DIET: Your first meal following the procedure should be a light meal and then it is ok to progress to your normal diet. A half-sandwich or bowl of soup is an example of a good first meal. Heavy or fried foods are harder to digest and may make you feel nauseous or bloated. Drink plenty of fluids but you should avoid alcoholic beverages for 24 hours. If you had a esophageal dilation, please see attached instructions for diet.    ACTIVITY: Your care partner should take you home directly after the procedure. You should plan to take it easy, moving slowly for the rest of the day. You can resume normal activity the day after the procedure however YOU SHOULD NOT DRIVE, use power tools, machinery or perform tasks that involve climbing or major physical exertion for 24 hours (because of the sedation medicines used during the test).   SYMPTOMS TO REPORT IMMEDIATELY: A gastroenterologist can be reached at any hour. Please call [phone]  for any of the following symptoms:   Following upper endoscopy (EGD, EUS, ERCP, esophageal dilation) Vomiting of blood or coffee ground material  New, significant abdominal pain  New, significant chest pain or pain under the shoulder blades  Painful or  persistently difficult swallowing  New shortness of breath  Black, tarry-looking or red, bloody stools  FOLLOW UP:  If any biopsies were taken you will be contacted by phone or by letter within the next 1-3 weeks. Call [phone]  if you have not heard about the biopsies in 3 weeks.  Please also call with any specific questions about appointments or follow up tests.  

## 2019-05-24 NOTE — Anesthesia Postprocedure Evaluation (Signed)
Anesthesia Post Note  Patient: Sylvia Cantrell  Procedure(s) Performed: UPPER ENDOSCOPIC ULTRASOUND (EUS) RADIAL (N/A ) ESOPHAGOGASTRODUODENOSCOPY (EGD) WITH PROPOFOL (N/A ) FINE NEEDLE ASPIRATION (FNA) LINEAR (N/A )     Patient location during evaluation: Endoscopy Anesthesia Type: MAC Level of consciousness: awake and alert Pain management: pain level controlled Vital Signs Assessment: post-procedure vital signs reviewed and stable Respiratory status: spontaneous breathing, nonlabored ventilation and respiratory function stable Cardiovascular status: blood pressure returned to baseline and stable Postop Assessment: no apparent nausea or vomiting Anesthetic complications: no    Last Vitals:  Vitals:   05/24/19 1010 05/24/19 1020  BP: (!) 118/93 (!) 157/110  Pulse: 76 78  Resp: 11 15  Temp:    SpO2: 100% 100%    Last Pain:  Vitals:   05/24/19 1020  TempSrc:   PainSc: 0-No pain                 Lidia Collum

## 2019-05-24 NOTE — Op Note (Signed)
[facility] Patient Name: Sylvia Cantrell Procedure Date: 05/24/2019 MRN: [id number] Attending MD: Rachael Fee , MD Date of Birth: 1975-08-31 CSN: [id number] Age: 44 Admit Type: Outpatient Procedure:                Upper EUS Indications:              Erick Blinks, MD referred for incidental pancreatic                            mass on recent CT/MR while she was in hospital for                            pneumonia. No history of pancreatic disease, no FH                            of pancreatic disease, weight up over the past 6-12                            months. Providers:                Rachael Fee, MD, Dwain Sarna, RN, Matthew Folks, Technician, Wanita Chamberlain, Technician Referring MD:              Medicines:                Monitored Anesthesia Care Complications:            No immediate complications. Estimated blood loss:                            None. Estimated Blood Loss:     Estimated blood loss: none. Procedure:                Pre-Anesthesia Assessment:                           - Prior to the procedure, a History and Physical                            was performed, and patient medications and                            allergies were reviewed. The patient's tolerance of                            previous anesthesia was also reviewed. The risks                            and benefits of the procedure and the sedation                            options and risks were discussed with the patient.  All questions were answered, and informed consent                            was obtained. Prior Anticoagulants: The patient has                            taken no previous anticoagulant or antiplatelet                            agents. ASA Grade Assessment: II - A patient with                            mild systemic disease. After reviewing the risks                            and benefits, the  patient was deemed in                            satisfactory condition to undergo the procedure.                           After obtaining informed consent, the endoscope was                            passed under direct vision. Throughout the                            procedure, the patient's blood pressure, pulse, and                            oxygen saturations were monitored continuously. The                            [id number]-[id number] ([id number]) Olympus Radial EUS was                            introduced through the mouth, and advanced to the                            second part of duodenum. The [id number] ([id number])                            Olympus Linear EUS was introduced through the                            mouth, and advanced to the second part of duodenum.                            The upper EUS was accomplished without difficulty.                            The patient tolerated the procedure well. Scope In: Scope Out: Findings:      ENDOSCOPIC FINDING: :  The examined esophagus was endoscopically normal.      The entire examined stomach was endoscopically normal.      The examined duodenum was endoscopically normal.      ENDOSONOGRAPHIC FINDING: :      1. There was a round, well circumscribed solid appearing mass in the       tail of the pancreas. The mass measured 26 mm by 24 mm in maximal       cross-sectional diameter. The endosonographic borders were well-defined.       Fine needle aspiration for cytology was performed. Color Doppler imaging       was utilized prior to needle puncture to confirm a lack of significant       vascular structures within the needle path. Three passes were made with       the 22 gauge needle using a transgastric approach. A cytotechnologist       was present to evaluate the adequacy of the specimen. Final cytology       results are pending.      2. Two anechoic cystic lesions were also noted, 6cm and 37mm in head and       body of  the pancreas. Neither of these has any concerning morphologic       features.      3. Main pancreatic duct was normal, non-dilated.      4. No peripancreatic adenopathy.      5. Limited views of the liver, spleen, portal and splenic vessels were       all normal.      6. CBD was normal, non-dilated. Impression:               - Incidental 2.6cm by 2.8cm tail of pancreas mass.                            This mass was described as 'largely cystic' on                            recent MR however it appears largely solid on this                            examination. EUS FNB performed, await final                            cytology results. Given the overall morphology                            however, I will likely recommend surgical resection                            (distal pancreatectomy). Moderate Sedation:      Not Applicable - Patient had care per Anesthesia. Recommendation:           - Discharge patient to home.                           - Await pathology results. Procedure Code(s):        --- Professional ---  43238, Esophagogastroduodenoscopy, flexible,                            transoral; with transendoscopic ultrasound-guided                            intramural or transmural fine needle                            aspiration/biopsy(s), (includes endoscopic                            ultrasound examination limited to the esophagus,                            stomach or duodenum, and adjacent structures) Diagnosis Code(s):        --- Professional ---                           K86.89, Other specified diseases of pancreas                           R93.3, Abnormal findings on diagnostic imaging of                            other parts of digestive tract CPT copyright 2019 American Medical Association. All rights reserved. The codes documented in this report are preliminary and upon coder review may  be revised to meet current compliance  requirements. Milus Banister, MD 05/24/2019 10:02:22 AM This report has been signed electronically. Number of Addenda: 0

## 2019-05-24 NOTE — Transfer of Care (Signed)
Immediate Anesthesia Transfer of Care Note  Patient: Sylvia Cantrell  Procedure(s) Performed: UPPER ENDOSCOPIC ULTRASOUND (EUS) RADIAL (N/A ) ESOPHAGOGASTRODUODENOSCOPY (EGD) WITH PROPOFOL (N/A ) FINE NEEDLE ASPIRATION (FNA) LINEAR (N/A )  Patient Location: PACU  Anesthesia Type:MAC  Level of Consciousness: awake, alert , oriented and patient cooperative  Airway & Oxygen Therapy: Patient Spontanous Breathing and Patient connected to face mask oxygen  Post-op Assessment: Report given to RN, Post -op Vital signs reviewed and stable and Patient moving all extremities X 4  Post vital signs: stable  Last Vitals:  Vitals Value Taken Time  BP 150/106 05/24/19 1000  Temp    Pulse 81 05/24/19 1007  Resp 18 05/24/19 1007  SpO2 100 % 05/24/19 1007  Vitals shown include unvalidated device data.  Last Pain:  Vitals:   05/24/19 0829  TempSrc: Oral  PainSc: 0-No pain         Complications: No apparent anesthesia complications

## 2019-05-24 NOTE — Anesthesia Procedure Notes (Signed)
Procedure Name: MAC Date/Time: 05/24/2019 8:52 AM Performed by: Lissa Morales, CRNA Pre-anesthesia Checklist: Patient identified, Emergency Drugs available, Suction available, Patient being monitored and Timeout performed Patient Re-evaluated:Patient Re-evaluated prior to induction Oxygen Delivery Method: Simple face mask Placement Confirmation: positive ETCO2

## 2019-05-24 NOTE — Interval H&P Note (Signed)
History and Physical Interval Note:  05/24/2019 8:25 AM  Sylvia Cantrell  has presented today for surgery, with the diagnosis of pancreatic mass.  The various methods of treatment have been discussed with the patient and family. After consideration of risks, benefits and other options for treatment, the patient has consented to  Procedure(s) with comments: UPPER ENDOSCOPIC ULTRASOUND (EUS) RADIAL (N/A) - category 2  ESOPHAGOGASTRODUODENOSCOPY (EGD) WITH PROPOFOL (N/A) - category 2  as a surgical intervention.  The patient's history has been reviewed, patient examined, no change in status, stable for surgery.  I have reviewed the patient's chart and labs.  Questions were answered to the patient's satisfaction.     Rachael Fee

## 2019-05-24 NOTE — Anesthesia Preprocedure Evaluation (Addendum)
Anesthesia Evaluation  Patient identified by MRN, date of birth, ID band Patient awake    Reviewed: Allergy & Precautions, NPO status , Patient's Chart, lab work & pertinent test results  History of Anesthesia Complications Negative for: history of anesthetic complications  Airway Mallampati: II  TM Distance: >3 FB Neck ROM: Full    Dental   Pulmonary asthma ,    Pulmonary exam normal        Cardiovascular hypertension, Pt. on medications Normal cardiovascular exam     Neuro/Psych Seizures -,  PSYCHIATRIC DISORDERS Anxiety Depression    GI/Hepatic Neg liver ROS, Pancreatic mass   Endo/Other  Morbid obesity  Renal/GU negative Renal ROS  negative genitourinary   Musculoskeletal negative musculoskeletal ROS (+)   Abdominal   Peds  Hematology negative hematology ROS (+)   Anesthesia Other Findings   Reproductive/Obstetrics                            Anesthesia Physical Anesthesia Plan  ASA: III  Anesthesia Plan: MAC   Post-op Pain Management:    Induction: Intravenous  PONV Risk Score and Plan: Propofol infusion, TIVA and Treatment may vary due to age or medical condition  Airway Management Planned: Natural Airway, Nasal Cannula and Simple Face Mask  Additional Equipment: None  Intra-op Plan:   Post-operative Plan:   Informed Consent: I have reviewed the patients History and Physical, chart, labs and discussed the procedure including the risks, benefits and alternatives for the proposed anesthesia with the patient or authorized representative who has indicated his/her understanding and acceptance.       Plan Discussed with:   Anesthesia Plan Comments:        Anesthesia Quick Evaluation

## 2019-05-25 ENCOUNTER — Other Ambulatory Visit: Payer: Self-pay

## 2019-05-28 ENCOUNTER — Telehealth: Payer: Self-pay | Admitting: Gastroenterology

## 2019-05-28 NOTE — Telephone Encounter (Signed)
The pt has been advised and states she will call back if her diarrhea worsens

## 2019-05-28 NOTE — Telephone Encounter (Signed)
Not sure why she is having the diarrhea. Seems like it's improving though.  If it speeds up again lets get labs (Gi pathogen panel) tomorrow.   Thanks

## 2019-05-28 NOTE — Telephone Encounter (Signed)
Pt had an EUS and reported that she is experiencing thin and yellow diarrhea and upper abd pain.  Please advise.

## 2019-05-28 NOTE — Telephone Encounter (Signed)
The pt had an EUS on 2/11 and beginning on 2/14 she started having very urgent watery,yellow diarrhea (10 episodes yesterday- 2 episodes today so far) with upper abd pain that radiates to the left.  Feels like a dull ache that is constant and consistent.  Also, occasional sharp pains that last just a second.  Has been having a pretty bland diet.  Some nausea when having a bowel movement.   Please advise

## 2019-05-29 LAB — CYTOLOGY - NON PAP

## 2019-05-30 ENCOUNTER — Other Ambulatory Visit: Payer: Self-pay

## 2019-05-30 NOTE — Progress Notes (Signed)
Patient will be referred to Dr. Byerly for surgical consult.  

## 2019-06-05 NOTE — Telephone Encounter (Signed)
The pt has been advised that I spoke with [facility] and they are reviewing her records and will call her this week with an appt.  The pt has been advised of the information and verbalized understanding.

## 2019-06-05 NOTE — Telephone Encounter (Signed)
Pt stated that she was to be referred to Dr. Lottie Rater (sp?).  She said that she has not heard from their office regarding scheduling.

## 2019-06-19 DIAGNOSIS — K8689 Other specified diseases of pancreas: Secondary | ICD-10-CM | POA: Diagnosis not present

## 2019-06-30 ENCOUNTER — Other Ambulatory Visit (HOSPITAL_COMMUNITY)
Admission: RE | Admit: 2019-06-30 | Discharge: 2019-06-30 | Disposition: A | Discharge status: Home / Self Care | Payer: BC Managed Care – PPO | Source: Ambulatory Visit | Attending: General Surgery | Admitting: General Surgery

## 2019-06-30 DIAGNOSIS — Z20822 Contact with and (suspected) exposure to covid-19: Secondary | ICD-10-CM | POA: Diagnosis not present

## 2019-06-30 DIAGNOSIS — Z01812 Encounter for preprocedural laboratory examination: Secondary | ICD-10-CM | POA: Diagnosis not present

## 2019-06-30 LAB — SARS CORONAVIRUS 2 (TAT 6-24 HRS): SARS Coronavirus 2: NEGATIVE

## 2019-07-02 ENCOUNTER — Ambulatory Visit (INDEPENDENT_AMBULATORY_CARE_PROVIDER_SITE_OTHER): Payer: BC Managed Care – PPO | Admitting: Family Medicine

## 2019-07-02 ENCOUNTER — Other Ambulatory Visit: Payer: Self-pay

## 2019-07-02 ENCOUNTER — Other Ambulatory Visit: Payer: Self-pay | Admitting: General Surgery

## 2019-07-02 DIAGNOSIS — Z23 Encounter for immunization: Secondary | ICD-10-CM | POA: Diagnosis not present

## 2019-07-02 NOTE — Addendum Note (Signed)
Addended by: Shon Hale on: 07/02/2019 02:24 PM   Modules accepted: Orders

## 2019-07-03 ENCOUNTER — Encounter (HOSPITAL_COMMUNITY): Payer: Self-pay | Admitting: General Surgery

## 2019-07-03 ENCOUNTER — Other Ambulatory Visit: Payer: Self-pay

## 2019-07-03 NOTE — Anesthesia Preprocedure Evaluation (Addendum)
Anesthesia Evaluation  Patient identified by MRN, date of birth, ID band Patient awake    Reviewed: Allergy & Precautions, NPO status , Patient's Chart, lab work & pertinent test results  Airway Mallampati: II  TM Distance: >3 FB Neck ROM: Full    Dental no notable dental hx. (+) Dental Advisory Given, Missing, Chipped, Teeth Intact,    Pulmonary asthma , former smoker,  2 weeeks since last inhaler use   Pulmonary exam normal breath sounds clear to auscultation       Cardiovascular hypertension, Pt. on medications Normal cardiovascular exam Rhythm:Regular Rate:Normal     Neuro/Psych Seizures -, Well Controlled,  PSYCHIATRIC DISORDERS Anxiety Depression    GI/Hepatic negative GI ROS,   Endo/Other  Morbid obesity  Renal/GU      Musculoskeletal   Abdominal   Peds  Hematology   Anesthesia Other Findings   Reproductive/Obstetrics negative OB ROS                         Anesthesia Physical Anesthesia Plan  ASA: III  Anesthesia Plan: General   Post-op Pain Management:    Induction:   PONV Risk Score and Plan: 4 or greater and Treatment may vary due to age or medical condition, Ondansetron, Dexamethasone and Midazolam  Airway Management Planned: Oral ETT  Additional Equipment: None  Intra-op Plan:   Post-operative Plan: Extubation in OR  Informed Consent: I have reviewed the patients History and Physical, chart, labs and discussed the procedure including the risks, benefits and alternatives for the proposed anesthesia with the patient or authorized representative who has indicated his/her understanding and acceptance.     Dental advisory given  Plan Discussed with:   Anesthesia Plan Comments:        Anesthesia Quick Evaluation

## 2019-07-03 NOTE — Progress Notes (Signed)
Pt denies SOB, chest pain, and being under the care of a cardiologist. Pt stated that PCP is Dr. Collie Siad. Pt denies having an echo, stress test and cardiac cath. Pt denies having recent labs. Pt made aware to stop taking Aspirin (unless otherwise advised by surgeon), vitamins, fish oil and herbal medications. Do not take any NSAIDs ie: Ibuprofen, Advil, Naproxen (Aleve), Motrin, BC and Goody Powder. Pt stated that she has started her bowel prep, therefore, pt is unable to pick up the Pre-Surgery Ensure drinks. Pt instructed to not eat after midnight tonight but may drink clears liquids from MN to 0600 on DOS ( water, Gatorade, black coffee only, black tea, apple juice .... ( no dairy, citrus fruit drinks). Pt reminded to quarantine. Pt verbalized understanding of all pre-op instructions. PA, Anesthesiology, asked to review pt EKG.

## 2019-07-03 NOTE — H&P (Signed)
Jones Broom Documented: 06/19/2019 1:31 PM Location: [facility] Patient #: [id number] DOB: [birth year] Separated / Language: [place] / Race: White Female   History of Present Illness Almond Lint MD; 06/28/2019 6:05 PM) The patient is a 44 year old female who presents with a pancreatic mass. Pt is a 44 yo F who is referred for consultation by Dr. Christella Hartigan for a diagnosis of a pancreatic tail mass noted on imaging Jan 2021. She developed LUQ/flank pain, and a CT was performed to look for kidney stones. Instead, a cystic mass was seen in the tail of the pancreas. MR was then performed, showing around a 3.1 cm mass in the tail that was primarily cystic in nature. There were two tiny cysts in the pancreatic head. She has no known history of pancreatitis. She denies any continued pain. She had EUS that showed a more solid appearing mass that was around 2.6 cm. FNA showed atypical cells. She has no family history of pancreatic cancer or other adenocarcinomas. There are several more distant relatives with melanoma. She is not a diabetic.     EUS 05/24/2019 Christella Hartigan 1. There was a round, well circumscribed solid appearing mass in the tail of the pancreas. The mass measured 26 mm by 24 mm in maximal cross-sectional diameter. The endosonographic borders were well-defined. Fine needle aspiration for cytology was performed. Color Doppler imaging was utilized prior passes were made with the 22 gauge needle using a transgastric approach. A cytotechnologist was present to evaluate the adequacy of the specimen. Final cytology results are pending. 2. Two anechoic cystic lesions were also noted, 6cm and 44mm in head and body of the pancreas. Neither of these has any concerning morphologic features. 3. Main pancreatic duct was normal, non-dilated. 4. No peripancreatic adenopathy. 5. Limited views of the liver, spleen, portal and splenic vessels were all normal. 6. CBD was normal,  non-dilated   CT abd/pelvis 04/25/2019 IMPRESSION: 1. No acute findings. No renal or ureteral stones or obstructive uropathy. No findings to account for the patient's left-sided pain and hematuria. 2. 2.6 cm pancreatic mass. Recommend further evaluation and characterization with pancreatic MRI without and with contrast. 3. Colonic diverticulosis without evidence of diverticulitis  MR abdomen 04/26/2019 IMPRESSION: 1. 3.1 x 2.7 cm cystic appearing lesion in the pancreatic tail, most likely a mucinous cystadenoma or mucinous cystic neoplasm. Solid pseudopapillary tumor would also be a consideration in this age. Recommend endoscopic biopsy (if possible in this location). 2. Two small (sub 8 mm) cystic lesions in the pancreatic head without worrisome MR imaging features. These will require surveillance. 3. No findings for metastatic disease or adenopathy.   Past Surgical History Santiago Glad, CMA; 06/19/2019 1:48 PM) Mammoplasty; Reduction  Bilateral. Tonsillectomy   Diagnostic Studies History Santiago Glad, [place]; 06/19/2019 1:48 PM) Mammogram  1-3 years ago Pap Smear  1-5 years ago  Allergies Santiago Glad, CMA; 06/19/2019 1:50 PM) Amoxicillin *PENICILLINS*  Codeine Phosphate *ANALGESICS - OPIOID*  Allergies Reconciled   Medication History Santiago Glad, CMA; 06/19/2019 1:50 PM) amLODIPine Besylate (5MG  Tablet, Oral) Active. Escitalopram Oxalate (5MG  Tablet, Oral) Active. Ventolin HFA (108 (90 Base)MCG/ACT Aerosol Soln, Inhalation) Active. Medications Reconciled  Social History , ; 06/19/2019 1:48 PM) Alcohol use  Occasional alcohol use. Caffeine use  Coffee, Tea. No drug use  Tobacco use  Never smoker.  Family History [place], 08/19/2019; 06/19/2019 1:48 PM) Arthritis  Mother. Bleeding disorder  Mother. Cerebrovascular Accident  Mother. Colon Polyps  Mother. Depression  Daughter. Heart Disease  Family Members In General,  Father. Heart disease in female family member before age 44  Hypertension  Family Members In General, Father, Mother. Melanoma  Family Members In General. Migraine Headache  Mother. Respiratory Condition  Mother. Thyroid problems  Mother.  Pregnancy / Birth History Emeline Gins, [place]; 06/19/2019 1:48 PM) Age at menarche  6 years. Contraceptive History  Intrauterine device, Oral contraceptives. Gravida  4 Irregular periods  Length (months) of breastfeeding  7-12 Maternal age  15-30 Para  2  Other Problems Emeline Gins, [place]; 06/19/2019 1:48 PM) Anxiety Disorder  Asthma  Back Pain  Depression  Gastroesophageal Reflux Disease  High blood pressure  Kidney Stone  Migraine Headache     Review of Systems Emeline Gins CMA; 06/19/2019 1:48 PM) General Present- Appetite Loss. Not Present- Chills, Fatigue, Fever, Night Sweats, Weight Gain and Weight Loss. HEENT Present- Nose Bleed, Seasonal Allergies and Wears glasses/contact lenses. Not Present- Earache, Hearing Loss, Hoarseness, Oral Ulcers, Ringing in the Ears, Sinus Pain, Sore Throat, Visual Disturbances and Yellow Eyes. Gastrointestinal Present- Bloating, Chronic diarrhea, Hemorrhoids, Indigestion and Nausea. Not Present- Abdominal Pain, Bloody Stool, Change in Bowel Habits, Constipation, Difficulty Swallowing, Excessive gas, Gets full quickly at meals, Rectal Pain and Vomiting. Musculoskeletal Present- Back Pain. Not Present- Joint Pain, Joint Stiffness, Muscle Pain, Muscle Weakness and Swelling of Extremities. Psychiatric Present- Anxiety. Not Present- Bipolar, Change in Sleep Pattern, Depression, Fearful and Frequent crying. Endocrine Present- Cold Intolerance. Not Present- Excessive Hunger, Hair Changes, Heat Intolerance, Hot flashes and New Diabetes.  Vitals Emeline Gins CMA; 06/19/2019 1:50 PM) 06/19/2019 1:49 PM Weight: 249.8 lb Height: 66in Body Surface Area: 2.2 m Body Mass Index: 40.32 kg/m   Temp.: 98.63F  Pulse: 75 (Regular)  BP: 136/86 (Sitting, Left Arm, Standard)       Physical Exam Stark Klein MD; 06/28/2019 6:05 PM) General Mental Status-Alert. General Appearance-Consistent with stated age. Hydration-Well hydrated. Voice-Normal.  Head and Neck Head-normocephalic, atraumatic with no lesions or palpable masses. Trachea-midline. Thyroid Gland Characteristics - normal size and consistency.  Eye Eyeball - Bilateral-Extraocular movements intact. Sclera/Conjunctiva - Bilateral-No scleral icterus.  Chest and Lung Exam Chest and lung exam reveals -quiet, even and easy respiratory effort with no use of accessory muscles and on auscultation, normal breath sounds, no adventitious sounds and normal vocal resonance. Inspection Chest Wall - Normal. Back - normal.  Cardiovascular Cardiovascular examination reveals -normal heart sounds, regular rate and rhythm with no murmurs and normal pedal pulses bilaterally.  Abdomen Inspection Inspection of the abdomen reveals - No Hernias. Palpation/Percussion Palpation and Percussion of the abdomen reveal - Soft, Non Tender, No Rebound tenderness, No Rigidity (guarding) and No hepatosplenomegaly. Auscultation Auscultation of the abdomen reveals - Bowel sounds normal.  Neurologic Neurologic evaluation reveals -alert and oriented x 3 with no impairment of recent or remote memory. Mental Status-Normal.  Musculoskeletal Global Assessment -Note: no gross deformities.  Normal Exam - Left-Upper Extremity Strength Normal and Lower Extremity Strength Normal. Normal Exam - Right-Upper Extremity Strength Normal and Lower Extremity Strength Normal.  Lymphatic Head & Neck  General Head & Neck Lymphatics: Bilateral - Description - Normal. Axillary  General Axillary Region: Bilateral - Description - Normal. Tenderness - Non Tender. Femoral & Inguinal  Generalized Femoral & Inguinal  Lymphatics: Bilateral - Description - No Generalized lymphadenopathy.    Assessment & Plan Stark Klein MD; 06/28/2019 6:08 PM) PANCREATIC MASS (K86.89) Impression: This solid mass will need to be excised. Will plan distal pancreatectomy/splenectomy. Have given her scripts for vaccines. Will plan  lap approach. This is likely neuroendocrine or solid pseudopapillary in nature. I reviewed surgery with the patient. I discussed pancreatic leak, possible damage to adjacent structures, possible heart or lung issues, possible bleeding/infections, blood clots, death. She wishes to proceed.  I discussed time in the hospital as well as recovery time at home.  We will get this done as soon as possible. Current Plans You are being scheduled for surgery- Our schedulers will call you.  You should hear from our office's scheduling department within 5 working days about the location, date, and time of surgery. We try to make accommodations for patient's preferences in scheduling surgery, but sometimes the OR schedule or the surgeon's schedule prevents [place] from making those accommodations.  If you have not heard from our office [phone]) in 5 working days, call the office and ask for your surgeon's nurse.  If you have other questions about your diagnosis, plan, or surgery, call the office and ask for your surgeon's nurse.  Pt Education - FB pancreatectomy   Signed electronically by Almond Lint, MD (06/28/2019 6:09 PM)

## 2019-07-04 ENCOUNTER — Encounter (HOSPITAL_COMMUNITY): Payer: Self-pay | Admitting: General Surgery

## 2019-07-04 ENCOUNTER — Other Ambulatory Visit: Payer: Self-pay

## 2019-07-04 ENCOUNTER — Inpatient Hospital Stay (HOSPITAL_COMMUNITY): Payer: BC Managed Care – PPO | Admitting: Certified Registered Nurse Anesthetist

## 2019-07-04 ENCOUNTER — Encounter (HOSPITAL_COMMUNITY)
Admission: RE | Disposition: A | Discharge status: Home / Self Care | Payer: Self-pay | Source: Home / Self Care | Attending: General Surgery

## 2019-07-04 ENCOUNTER — Inpatient Hospital Stay (HOSPITAL_COMMUNITY)
Admission: RE | Admit: 2019-07-04 | Discharge: 2019-07-08 | DRG: 406 | Disposition: A | Discharge status: Home / Self Care | Payer: BC Managed Care – PPO | Attending: General Surgery | Admitting: General Surgery

## 2019-07-04 DIAGNOSIS — I10 Essential (primary) hypertension: Secondary | ICD-10-CM | POA: Diagnosis not present

## 2019-07-04 DIAGNOSIS — Z20822 Contact with and (suspected) exposure to covid-19: Secondary | ICD-10-CM | POA: Diagnosis present

## 2019-07-04 DIAGNOSIS — Z8371 Family history of colonic polyps: Secondary | ICD-10-CM

## 2019-07-04 DIAGNOSIS — K869 Disease of pancreas, unspecified: Secondary | ICD-10-CM | POA: Diagnosis not present

## 2019-07-04 DIAGNOSIS — Z832 Family history of diseases of the blood and blood-forming organs and certain disorders involving the immune mechanism: Secondary | ICD-10-CM

## 2019-07-04 DIAGNOSIS — K5730 Diverticulosis of large intestine without perforation or abscess without bleeding: Secondary | ICD-10-CM | POA: Diagnosis present

## 2019-07-04 DIAGNOSIS — D72829 Elevated white blood cell count, unspecified: Secondary | ICD-10-CM | POA: Diagnosis not present

## 2019-07-04 DIAGNOSIS — Z825 Family history of asthma and other chronic lower respiratory diseases: Secondary | ICD-10-CM

## 2019-07-04 DIAGNOSIS — D136 Benign neoplasm of pancreas: Secondary | ICD-10-CM | POA: Diagnosis not present

## 2019-07-04 DIAGNOSIS — Z8261 Family history of arthritis: Secondary | ICD-10-CM | POA: Diagnosis not present

## 2019-07-04 DIAGNOSIS — Z79899 Other long term (current) drug therapy: Secondary | ICD-10-CM | POA: Diagnosis not present

## 2019-07-04 DIAGNOSIS — Z823 Family history of stroke: Secondary | ICD-10-CM | POA: Diagnosis not present

## 2019-07-04 DIAGNOSIS — Z6841 Body Mass Index (BMI) 40.0 and over, adult: Secondary | ICD-10-CM

## 2019-07-04 DIAGNOSIS — D500 Iron deficiency anemia secondary to blood loss (chronic): Secondary | ICD-10-CM | POA: Diagnosis not present

## 2019-07-04 DIAGNOSIS — K8689 Other specified diseases of pancreas: Secondary | ICD-10-CM | POA: Diagnosis present

## 2019-07-04 DIAGNOSIS — Z8249 Family history of ischemic heart disease and other diseases of the circulatory system: Secondary | ICD-10-CM

## 2019-07-04 DIAGNOSIS — J4520 Mild intermittent asthma, uncomplicated: Secondary | ICD-10-CM | POA: Diagnosis not present

## 2019-07-04 HISTORY — DX: Other specified diseases of pancreas: K86.89

## 2019-07-04 HISTORY — DX: Pneumonia, unspecified organism: J18.9

## 2019-07-04 HISTORY — PX: PANCREATECTOMY: SHX5261

## 2019-07-04 LAB — CBC
HCT: 43.7 % (ref 36.0–46.0)
Hemoglobin: 13.9 g/dL (ref 12.0–15.0)
MCH: 27.3 pg (ref 26.0–34.0)
MCHC: 31.8 g/dL (ref 30.0–36.0)
MCV: 85.9 fL (ref 80.0–100.0)
Platelets: 257 10*3/uL (ref 150–400)
RBC: 5.09 MIL/uL (ref 3.87–5.11)
RDW: 13.8 % (ref 11.5–15.5)
WBC: 5.9 10*3/uL (ref 4.0–10.5)
nRBC: 0 % (ref 0.0–0.2)

## 2019-07-04 LAB — BASIC METABOLIC PANEL
Anion gap: 11 (ref 5–15)
BUN: 9 mg/dL (ref 6–20)
CO2: 23 mmol/L (ref 22–32)
Calcium: 9.3 mg/dL (ref 8.9–10.3)
Chloride: 104 mmol/L (ref 98–111)
Creatinine, Ser: 0.82 mg/dL (ref 0.44–1.00)
GFR calc Af Amer: >60 mL/min (ref >60)
GFR calc non Af Amer: >60 mL/min (ref >60)
Glucose, Bld: 90 mg/dL (ref 70–99)
Potassium: 3.6 mmol/L (ref 3.5–5.1)
Sodium: 138 mmol/L (ref 135–145)

## 2019-07-04 LAB — TYPE AND SCREEN
ABO/RH(D): A POS
Antibody Screen: NEGATIVE

## 2019-07-04 LAB — GLUCOSE, CAPILLARY: Glucose-Capillary: 148 mg/dL — ABNORMAL HIGH (ref 70–99)

## 2019-07-04 LAB — POCT PREGNANCY, URINE: Preg Test, Ur: NEGATIVE

## 2019-07-04 LAB — ABO/RH: ABO/RH(D): A POS

## 2019-07-04 SURGERY — PANCREATECTOMY, LAPAROSCOPIC
Anesthesia: General | Site: Abdomen

## 2019-07-04 MED ORDER — DIPHENHYDRAMINE HCL 50 MG/ML IJ SOLN
INTRAMUSCULAR | Status: AC
  Filled 2019-07-04: qty 1

## 2019-07-04 MED ORDER — OXYCODONE HCL 5 MG PO TABS
5.0000 mg | ORAL_TABLET | Freq: Once | ORAL | Status: AC | PRN
  Administered 2019-07-04: 5 mg via ORAL

## 2019-07-04 MED ORDER — 0.9 % SODIUM CHLORIDE (POUR BTL) OPTIME
TOPICAL | Status: DC | PRN
Start: 2019-07-04 — End: 2019-07-04
  Administered 2019-07-04 (×2): 1000 mL

## 2019-07-04 MED ORDER — HYDROMORPHONE HCL 1 MG/ML IJ SOLN
0.2500 mg | INTRAMUSCULAR | Status: DC | PRN
  Administered 2019-07-04 (×4): 0.5 mg via INTRAVENOUS

## 2019-07-04 MED ORDER — CHLORHEXIDINE GLUCONATE CLOTH 2 % EX PADS: 6.0000 | MEDICATED_PAD | Freq: Once | CUTANEOUS | Status: DC

## 2019-07-04 MED ORDER — ZOLPIDEM TARTRATE 5 MG PO TABS: 5.0000 mg | ORAL_TABLET | Freq: Every evening | ORAL | Status: DC | PRN

## 2019-07-04 MED ORDER — HYDROMORPHONE HCL 1 MG/ML IJ SOLN
INTRAMUSCULAR | Status: AC
  Filled 2019-07-04: qty 1

## 2019-07-04 MED ORDER — ROCURONIUM BROMIDE 10 MG/ML (PF) SYRINGE
PREFILLED_SYRINGE | INTRAVENOUS | Status: DC | PRN
  Administered 2019-07-04: 60 mg via INTRAVENOUS
  Administered 2019-07-04: 20 mg via INTRAVENOUS

## 2019-07-04 MED ORDER — PROPOFOL 10 MG/ML IV BOLUS
INTRAVENOUS | Status: AC
  Filled 2019-07-04: qty 40

## 2019-07-04 MED ORDER — BUPIVACAINE ON-Q PAIN PUMP (FOR ORDER SET NO CHG)
INJECTION | Status: AC
  Filled 2019-07-04: qty 1

## 2019-07-04 MED ORDER — METHOCARBAMOL 1000 MG/10ML IJ SOLN
500.0000 mg | Freq: Three times a day (TID) | INTRAVENOUS | Status: DC | PRN
  Administered 2019-07-05: 500 mg via INTRAVENOUS
  Filled 2019-07-04 (×5): qty 5

## 2019-07-04 MED ORDER — LACTATED RINGERS IV SOLN: INTRAVENOUS | Status: DC

## 2019-07-04 MED ORDER — BUPIVACAINE HCL (PF) 0.25 % IJ SOLN
INTRAMUSCULAR | Status: AC
  Filled 2019-07-04: qty 30

## 2019-07-04 MED ORDER — CIPROFLOXACIN IN D5W 400 MG/200ML IV SOLN
400.0000 mg | INTRAVENOUS | Status: AC
  Administered 2019-07-04: 400 mg via INTRAVENOUS
  Filled 2019-07-04: qty 200

## 2019-07-04 MED ORDER — KETOROLAC TROMETHAMINE 30 MG/ML IJ SOLN
30.0000 mg | Freq: Three times a day (TID) | INTRAMUSCULAR | Status: AC
  Administered 2019-07-04 – 2019-07-06 (×6): 30 mg via INTRAVENOUS
  Filled 2019-07-04 (×6): qty 1

## 2019-07-04 MED ORDER — OXYCODONE HCL 5 MG PO TABS
5.0000 mg | ORAL_TABLET | ORAL | Status: DC | PRN
  Administered 2019-07-06 – 2019-07-07 (×4): 10 mg via ORAL
  Administered 2019-07-08: 5 mg via ORAL
  Administered 2019-07-08: 10 mg via ORAL
  Filled 2019-07-04: qty 2
  Filled 2019-07-04: qty 1
  Filled 2019-07-04 (×4): qty 2

## 2019-07-04 MED ORDER — KCL IN DEXTROSE-NACL 20-5-0.45 MEQ/L-%-% IV SOLN
INTRAVENOUS | Status: DC
  Filled 2019-07-04 (×5): qty 1000

## 2019-07-04 MED ORDER — FENTANYL CITRATE (PF) 250 MCG/5ML IJ SOLN
INTRAMUSCULAR | Status: AC
  Filled 2019-07-04: qty 5

## 2019-07-04 MED ORDER — DEXAMETHASONE SODIUM PHOSPHATE 10 MG/ML IJ SOLN
INTRAMUSCULAR | Status: AC
  Filled 2019-07-04: qty 1

## 2019-07-04 MED ORDER — OXYCODONE HCL 5 MG PO TABS
ORAL_TABLET | ORAL | Status: AC
  Filled 2019-07-04: qty 1

## 2019-07-04 MED ORDER — LIDOCAINE-EPINEPHRINE 1 %-1:100000 IJ SOLN
INTRAMUSCULAR | Status: AC
  Filled 2019-07-04: qty 1

## 2019-07-04 MED ORDER — ALBUTEROL SULFATE HFA 108 (90 BASE) MCG/ACT IN AERS: 2.0000 | INHALATION_SPRAY | RESPIRATORY_TRACT | Status: DC | PRN

## 2019-07-04 MED ORDER — ACETAMINOPHEN 10 MG/ML IV SOLN: 1000.0000 mg | Freq: Once | INTRAVENOUS | Status: DC | PRN

## 2019-07-04 MED ORDER — BUPIVACAINE 0.25 % ON-Q PUMP DUAL CATH 300 ML
300.0000 mL | INJECTION | Status: AC
  Filled 2019-07-04: qty 300

## 2019-07-04 MED ORDER — ONDANSETRON HCL 4 MG/2ML IJ SOLN
INTRAMUSCULAR | Status: AC
  Filled 2019-07-04: qty 2

## 2019-07-04 MED ORDER — ROCURONIUM BROMIDE 10 MG/ML (PF) SYRINGE
PREFILLED_SYRINGE | INTRAVENOUS | Status: AC
  Filled 2019-07-04: qty 10

## 2019-07-04 MED ORDER — MEPERIDINE HCL 25 MG/ML IJ SOLN: 6.2500 mg | INTRAMUSCULAR | Status: DC | PRN

## 2019-07-04 MED ORDER — SENNA 8.6 MG PO TABS
1.0000 | ORAL_TABLET | Freq: Two times a day (BID) | ORAL | Status: DC
  Administered 2019-07-05 – 2019-07-08 (×7): 8.6 mg via ORAL
  Filled 2019-07-04 (×8): qty 1

## 2019-07-04 MED ORDER — LORATADINE 10 MG PO TABS: 10.0000 mg | ORAL_TABLET | Freq: Every day | ORAL | Status: DC

## 2019-07-04 MED ORDER — ACETAMINOPHEN 650 MG RE SUPP: 650.0000 mg | Freq: Four times a day (QID) | RECTAL | Status: DC | PRN

## 2019-07-04 MED ORDER — ENOXAPARIN SODIUM 40 MG/0.4ML ~~LOC~~ SOLN
40.0000 mg | SUBCUTANEOUS | Status: DC
  Administered 2019-07-05 – 2019-07-08 (×4): 40 mg via SUBCUTANEOUS
  Filled 2019-07-04 (×4): qty 0.4

## 2019-07-04 MED ORDER — DEXAMETHASONE SODIUM PHOSPHATE 10 MG/ML IJ SOLN
INTRAMUSCULAR | Status: DC | PRN
  Administered 2019-07-04: 8 mg via INTRAVENOUS

## 2019-07-04 MED ORDER — DIPHENHYDRAMINE HCL 50 MG/ML IJ SOLN
12.5000 mg | Freq: Four times a day (QID) | INTRAMUSCULAR | Status: DC | PRN
  Administered 2019-07-04: 12.5 mg via INTRAVENOUS

## 2019-07-04 MED ORDER — CIPROFLOXACIN IN D5W 400 MG/200ML IV SOLN
400.0000 mg | Freq: Two times a day (BID) | INTRAVENOUS | Status: AC
  Administered 2019-07-04: 400 mg via INTRAVENOUS
  Filled 2019-07-04: qty 200

## 2019-07-04 MED ORDER — LACTATED RINGERS IV SOLN: INTRAVENOUS | Status: DC | PRN

## 2019-07-04 MED ORDER — FENTANYL CITRATE (PF) 100 MCG/2ML IJ SOLN
INTRAMUSCULAR | Status: DC | PRN
  Administered 2019-07-04: 100 ug via INTRAVENOUS
  Administered 2019-07-04: 50 ug via INTRAVENOUS
  Administered 2019-07-04: 100 ug via INTRAVENOUS

## 2019-07-04 MED ORDER — ESCITALOPRAM OXALATE 10 MG PO TABS
5.0000 mg | ORAL_TABLET | Freq: Every day | ORAL | Status: DC
  Administered 2019-07-04 – 2019-07-07 (×4): 5 mg via ORAL
  Filled 2019-07-04 (×4): qty 1

## 2019-07-04 MED ORDER — CALCIUM CARBONATE ANTACID 500 MG PO CHEW: 2.0000 | CHEWABLE_TABLET | Freq: Every day | ORAL | Status: DC | PRN

## 2019-07-04 MED ORDER — PROPOFOL 10 MG/ML IV BOLUS
INTRAVENOUS | Status: DC | PRN
  Administered 2019-07-04: 170 mg via INTRAVENOUS

## 2019-07-04 MED ORDER — LIDOCAINE 2% (20 MG/ML) 5 ML SYRINGE
INTRAMUSCULAR | Status: AC
  Filled 2019-07-04: qty 5

## 2019-07-04 MED ORDER — DIPHENHYDRAMINE HCL 50 MG/ML IJ SOLN
12.5000 mg | Freq: Four times a day (QID) | INTRAMUSCULAR | Status: DC | PRN
  Filled 2019-07-04: qty 1

## 2019-07-04 MED ORDER — EPHEDRINE SULFATE-NACL 50-0.9 MG/10ML-% IV SOSY
PREFILLED_SYRINGE | INTRAVENOUS | Status: DC | PRN
  Administered 2019-07-04: 10 mg via INTRAVENOUS
  Administered 2019-07-04: 5 mg via INTRAVENOUS
  Administered 2019-07-04: 10 mg via INTRAVENOUS

## 2019-07-04 MED ORDER — DIPHENHYDRAMINE HCL 12.5 MG/5ML PO ELIX: 12.5000 mg | ORAL_SOLUTION | Freq: Four times a day (QID) | ORAL | Status: DC | PRN

## 2019-07-04 MED ORDER — ENSURE PRE-SURGERY PO LIQD: 296.0000 mL | Freq: Once | ORAL | Status: DC

## 2019-07-04 MED ORDER — DIPHENHYDRAMINE HCL 50 MG/ML IJ SOLN
6.2500 mg | Freq: Once | INTRAMUSCULAR | Status: AC
  Administered 2019-07-04: 6.25 mg via INTRAVENOUS

## 2019-07-04 MED ORDER — PHENYLEPHRINE HCL-NACL 10-0.9 MG/250ML-% IV SOLN
INTRAVENOUS | Status: DC | PRN
  Administered 2019-07-04: 60 ug/min via INTRAVENOUS
  Administered 2019-07-04: 25 ug/min via INTRAVENOUS

## 2019-07-04 MED ORDER — ACETAMINOPHEN 500 MG PO TABS
1000.0000 mg | ORAL_TABLET | Freq: Four times a day (QID) | ORAL | Status: DC | PRN
  Administered 2019-07-06 – 2019-07-08 (×6): 1000 mg via ORAL
  Filled 2019-07-04 (×8): qty 2

## 2019-07-04 MED ORDER — HYDROMORPHONE HCL 1 MG/ML IJ SOLN: 0.2500 mg | INTRAMUSCULAR | Status: DC | PRN

## 2019-07-04 MED ORDER — MIDAZOLAM HCL 2 MG/2ML IJ SOLN
INTRAMUSCULAR | Status: DC | PRN
  Administered 2019-07-04: 2 mg via INTRAVENOUS

## 2019-07-04 MED ORDER — MORPHINE SULFATE (PF) 2 MG/ML IV SOLN
1.0000 mg | INTRAVENOUS | Status: DC | PRN
  Administered 2019-07-04 – 2019-07-05 (×4): 2 mg via INTRAVENOUS
  Administered 2019-07-05: 1 mg via INTRAVENOUS
  Administered 2019-07-05 – 2019-07-06 (×4): 2 mg via INTRAVENOUS
  Administered 2019-07-06: 1 mg via INTRAVENOUS
  Administered 2019-07-07 (×2): 2 mg via INTRAVENOUS
  Filled 2019-07-04 (×12): qty 1

## 2019-07-04 MED ORDER — SUGAMMADEX SODIUM 200 MG/2ML IV SOLN
INTRAVENOUS | Status: DC | PRN
  Administered 2019-07-04: 230 mg via INTRAVENOUS

## 2019-07-04 MED ORDER — LORATADINE 10 MG PO TABS
10.0000 mg | ORAL_TABLET | Freq: Every day | ORAL | Status: DC
  Administered 2019-07-05 – 2019-07-07 (×3): 10 mg via ORAL
  Filled 2019-07-04 (×4): qty 1

## 2019-07-04 MED ORDER — OXYCODONE HCL 5 MG/5ML PO SOLN: 5.0000 mg | Freq: Once | ORAL | Status: AC | PRN

## 2019-07-04 MED ORDER — DEXMEDETOMIDINE HCL IN NACL 200 MCG/50ML IV SOLN
INTRAVENOUS | Status: DC | PRN
  Administered 2019-07-04 (×2): 16 ug via INTRAVENOUS

## 2019-07-04 MED ORDER — BUPIVACAINE 0.25 % ON-Q PUMP DUAL CATH 300 ML
INJECTION | Status: AC | PRN
  Administered 2019-07-04: 300 mL

## 2019-07-04 MED ORDER — LIDOCAINE-EPINEPHRINE 1 %-1:100000 IJ SOLN
INTRAMUSCULAR | Status: DC | PRN
  Administered 2019-07-04: 31 mL via INTRAMUSCULAR

## 2019-07-04 MED ORDER — ENSURE PRE-SURGERY PO LIQD: 592.0000 mL | Freq: Once | ORAL | Status: DC

## 2019-07-04 MED ORDER — LIDOCAINE 2% (20 MG/ML) 5 ML SYRINGE
INTRAMUSCULAR | Status: DC | PRN
  Administered 2019-07-04: 100 mg via INTRAVENOUS

## 2019-07-04 MED ORDER — GABAPENTIN 100 MG PO CAPS
100.0000 mg | ORAL_CAPSULE | Freq: Two times a day (BID) | ORAL | Status: DC
  Administered 2019-07-04 – 2019-07-08 (×8): 100 mg via ORAL
  Filled 2019-07-04 (×8): qty 1

## 2019-07-04 MED ORDER — ACETAMINOPHEN 500 MG PO TABS
1000.0000 mg | ORAL_TABLET | ORAL | Status: AC
  Administered 2019-07-04: 1000 mg via ORAL
  Filled 2019-07-04: qty 2

## 2019-07-04 MED ORDER — FLUTICASONE PROPIONATE 50 MCG/ACT NA SUSP
2.0000 | Freq: Every day | NASAL | Status: DC | PRN
  Filled 2019-07-04: qty 16

## 2019-07-04 MED ORDER — MIDAZOLAM HCL 2 MG/2ML IJ SOLN
INTRAMUSCULAR | Status: AC
  Filled 2019-07-04: qty 2

## 2019-07-04 MED ORDER — LIDOCAINE IN D5W 4-5 MG/ML-% IV SOLN
INTRAVENOUS | Status: DC | PRN
  Administered 2019-07-04: 25 ug/kg/min via INTRAVENOUS

## 2019-07-04 MED ORDER — VISTASEAL 10 ML SINGLE DOSE KIT
PACK | CUTANEOUS | Status: DC | PRN
Start: 2019-07-04 — End: 2019-07-04
  Administered 2019-07-04 (×2): 10 mL via TOPICAL

## 2019-07-04 MED ORDER — STERILE WATER FOR IRRIGATION IR SOLN
Status: DC | PRN
  Administered 2019-07-04: 1000 mL

## 2019-07-04 MED ORDER — ONDANSETRON HCL 4 MG/2ML IJ SOLN
4.0000 mg | Freq: Four times a day (QID) | INTRAMUSCULAR | Status: DC | PRN
  Administered 2019-07-04 – 2019-07-05 (×2): 4 mg via INTRAVENOUS
  Filled 2019-07-04 (×2): qty 2

## 2019-07-04 MED ORDER — VISTASEAL 10 ML SINGLE DOSE KIT
10.0000 mL | PACK | Freq: Once | CUTANEOUS | Status: DC
  Filled 2019-07-04 (×2): qty 10

## 2019-07-04 MED ORDER — ONDANSETRON HCL 4 MG/2ML IJ SOLN
INTRAMUSCULAR | Status: DC | PRN
  Administered 2019-07-04: 4 mg via INTRAVENOUS

## 2019-07-04 MED ORDER — SODIUM CHLORIDE 0.9 % IR SOLN
Status: DC | PRN
Start: 2019-07-04 — End: 2019-07-04
  Administered 2019-07-04: 1000 mL

## 2019-07-04 MED ORDER — ONDANSETRON HCL 4 MG/2ML IJ SOLN: 4.0000 mg | Freq: Once | INTRAMUSCULAR | Status: DC | PRN

## 2019-07-04 MED ORDER — ONDANSETRON 4 MG PO TBDP: 4.0000 mg | ORAL_TABLET | Freq: Four times a day (QID) | ORAL | Status: DC | PRN

## 2019-07-04 MED ORDER — AMLODIPINE BESYLATE 5 MG PO TABS
5.0000 mg | ORAL_TABLET | Freq: Every day | ORAL | Status: DC
  Administered 2019-07-04 – 2019-07-07 (×4): 5 mg via ORAL
  Filled 2019-07-04 (×4): qty 1

## 2019-07-04 SURGICAL SUPPLY — 97 items
APPLICATOR VISTASEAL 35 (MISCELLANEOUS) ×1 IMPLANT
APPLIER CLIP 5 13 M/L LIGAMAX5 (MISCELLANEOUS)
BAG BILE T-TUBES STRL (MISCELLANEOUS) IMPLANT
BLADE CLIPPER SURG (BLADE) IMPLANT
CANISTER SUCT 3000ML PPV (MISCELLANEOUS) ×2 IMPLANT
CATH KIT ON Q 5IN DUAL SLV (PAIN MANAGEMENT) IMPLANT
CATH KIT ON-Q SILVERSOAK 5 (CATHETERS) ×1 IMPLANT
CATH KIT ON-Q SILVERSOAK 5IN (CATHETERS) ×4 IMPLANT
CATH ROBINSON RED A/P 14FR (CATHETERS) IMPLANT
CATH ROBINSON RED A/P 16FR (CATHETERS) IMPLANT
CHLORAPREP W/TINT 26 (MISCELLANEOUS) ×2 IMPLANT
CLIP APPLIE 5 13 M/L LIGAMAX5 (MISCELLANEOUS) ×1 IMPLANT
CLIP VESOCCLUDE LG 6/CT (CLIP) ×1 IMPLANT
CLIP VESOCCLUDE MED 24/CT (CLIP) IMPLANT
CLIP VESOCCLUDE MED 6/CT (CLIP) ×1 IMPLANT
CLIP VESOCCLUDE SM WIDE 24/CT (CLIP) IMPLANT
CLIP VESOLOCK LG 6/CT PURPLE (CLIP) ×1 IMPLANT
CLIP VESOLOCK MED 6/CT (CLIP) ×1 IMPLANT
CLIP VESOLOCK MED LG 6/CT (CLIP) ×1 IMPLANT
COVER SURGICAL LIGHT HANDLE (MISCELLANEOUS) ×2 IMPLANT
COVER WAND RF STERILE (DRAPES) ×2 IMPLANT
DERMABOND ADVANCED (GAUZE/BANDAGES/DRESSINGS) ×1
DERMABOND ADVANCED .7 DNX12 (GAUZE/BANDAGES/DRESSINGS) IMPLANT
DRAIN CHANNEL 19F RND (DRAIN) IMPLANT
DRAIN PENROSE 0.5X18 (DRAIN) IMPLANT
DRAPE UTILITY XL STRL (DRAPES) ×4 IMPLANT
DRAPE WARM FLUID 44X44 (DRAPES) ×2 IMPLANT
DRSG COVADERM 4X10 (GAUZE/BANDAGES/DRESSINGS) IMPLANT
DRSG COVADERM 4X14 (GAUZE/BANDAGES/DRESSINGS) IMPLANT
DRSG COVADERM 4X6 (GAUZE/BANDAGES/DRESSINGS) ×1 IMPLANT
DRSG COVADERM PLUS 2X2 (GAUZE/BANDAGES/DRESSINGS) ×1 IMPLANT
ELECT BLADE 6.5 EXT (BLADE) IMPLANT
ELECT CAUTERY BLADE 6.4 (BLADE) ×2 IMPLANT
ELECT REM PT RETURN 9FT ADLT (ELECTROSURGICAL) ×2
ELECTRODE REM PT RTRN 9FT ADLT (ELECTROSURGICAL) ×1 IMPLANT
EVACUATOR SILICONE 100CC (DRAIN) IMPLANT
GAUZE SPONGE 4X4 12PLY STRL (GAUZE/BANDAGES/DRESSINGS) IMPLANT
GEL PDS (MISCELLANEOUS) ×1 IMPLANT
GLOVE BIO SURGEON STRL SZ 6 (GLOVE) ×2 IMPLANT
GLOVE INDICATOR 6.5 STRL GRN (GLOVE) ×2 IMPLANT
GOWN STRL REUS W/ TWL LRG LVL3 (GOWN DISPOSABLE) ×2 IMPLANT
GOWN STRL REUS W/TWL 2XL LVL3 (GOWN DISPOSABLE) ×2 IMPLANT
GOWN STRL REUS W/TWL LRG LVL3 (GOWN DISPOSABLE) ×2
KIT BASIN OR (CUSTOM PROCEDURE TRAY) ×2 IMPLANT
KIT TURNOVER KIT B (KITS) ×2 IMPLANT
L-HOOK LAP DISP 36CM (ELECTROSURGICAL) ×2
LHOOK LAP DISP 36CM (ELECTROSURGICAL) ×1 IMPLANT
NS IRRIG 1000ML POUR BTL (IV SOLUTION) ×2 IMPLANT
PAD ARMBOARD 7.5X6 YLW CONV (MISCELLANEOUS) ×4 IMPLANT
PENCIL BUTTON HOLSTER BLD 10FT (ELECTRODE) ×4 IMPLANT
POUCH LAPAROSCOPIC INSTRUMENT (MISCELLANEOUS) ×2 IMPLANT
RELOAD STAPLE 60 2.6 WHT THN (STAPLE) IMPLANT
RELOAD STAPLE 60 3.6 BLU REG (STAPLE) IMPLANT
RELOAD STAPLER BLUE 60MM (STAPLE) ×2 IMPLANT
RELOAD STAPLER WHITE 60MM (STAPLE) ×2 IMPLANT
SCISSORS LAP 5X35 DISP (ENDOMECHANICALS) ×2 IMPLANT
SET IRRIG TUBING LAPAROSCOPIC (IRRIGATION / IRRIGATOR) ×2 IMPLANT
SET TUBE SMOKE EVAC HIGH FLOW (TUBING) ×2 IMPLANT
SHEARS HARMONIC ACE PLUS 36CM (ENDOMECHANICALS) ×2 IMPLANT
SLEEVE ENDOPATH XCEL 5M (ENDOMECHANICALS) ×5 IMPLANT
SLEEVE SURGEON STRL (DRAPES) ×2 IMPLANT
SPONGE INTESTINAL PEANUT (DISPOSABLE) IMPLANT
STAPLE ECHEON FLEX 60 POW ENDO (STAPLE) ×2 IMPLANT
STAPLER RELOAD BLUE 60MM (STAPLE) ×4
STAPLER RELOAD WHITE 60MM (STAPLE) ×4
STAPLER VISISTAT 35W (STAPLE) ×2 IMPLANT
STRIP PERI DRY VERITAS 60 (STAPLE) ×2 IMPLANT
SUT ETHILON 2 0 FS 18 (SUTURE) ×1 IMPLANT
SUT MNCRL AB 4-0 PS2 18 (SUTURE) IMPLANT
SUT PDS AB 1 TP1 54 (SUTURE) ×2 IMPLANT
SUT PDS AB 1 TP1 96 (SUTURE) IMPLANT
SUT PDS AB 3-0 SH 27 (SUTURE) IMPLANT
SUT PDS II 0 TP-1 LOOPED 60 (SUTURE) IMPLANT
SUT PROLENE 3 0 SH 48 (SUTURE) IMPLANT
SUT PROLENE 4 0 RB 1 (SUTURE)
SUT PROLENE 4 0 SH DA (SUTURE) IMPLANT
SUT PROLENE 4-0 RB1 .5 CRCL 36 (SUTURE) IMPLANT
SUT PROLENE 5 0 C1 (SUTURE) IMPLANT
SUT SILK 2 0 SH CR/8 (SUTURE) ×2 IMPLANT
SUT SILK 2 0 TIES 10X30 (SUTURE) ×2 IMPLANT
SUT SILK 3 0 SH CR/8 (SUTURE) ×2 IMPLANT
SUT SILK 3 0 TIES 10X30 (SUTURE) ×2 IMPLANT
SYR BULB IRRIGATION 50ML (SYRINGE) ×1 IMPLANT
SYS LAPSCP GELPORT 120MM (MISCELLANEOUS) ×2
SYSTEM LAPSCP GELPORT 120MM (MISCELLANEOUS) IMPLANT
TOWEL GREEN STERILE (TOWEL DISPOSABLE) ×2 IMPLANT
TOWEL GREEN STERILE FF (TOWEL DISPOSABLE) ×2 IMPLANT
TRAY FOLEY MTR SLVR 14FR STAT (SET/KITS/TRAYS/PACK) ×2 IMPLANT
TRAY LAPAROSCOPIC MC (CUSTOM PROCEDURE TRAY) ×2 IMPLANT
TROCAR BLADELESS 12MM (ENDOMECHANICALS) ×1 IMPLANT
TROCAR XCEL 12X100 BLDLESS (ENDOMECHANICALS) ×2 IMPLANT
TROCAR XCEL BLUNT TIP 100MML (ENDOMECHANICALS) IMPLANT
TROCAR XCEL NON-BLD 5MMX100MML (ENDOMECHANICALS) ×2 IMPLANT
TUBE CONNECTING 12X1/4 (SUCTIONS) ×2 IMPLANT
TUNNELER SHEATH ON-Q 16GX12 DP (PAIN MANAGEMENT) ×2 IMPLANT
WATER STERILE IRR 1000ML POUR (IV SOLUTION) ×2 IMPLANT
YANKAUER SUCT BULB TIP NO VENT (SUCTIONS) ×2 IMPLANT

## 2019-07-04 NOTE — Interval H&P Note (Signed)
History and Physical Interval Note:  07/04/2019 8:57 AM  [name]  has presented today for surgery, with the diagnosis of pancreatic mass.  The various methods of treatment have been discussed with the patient and family. After consideration of risks, benefits and other options for treatment, the patient has consented to  Procedure(s): HAND ASSISTED DISTAL PANCREATECTOMY/SPLENECTOMY (N/A) as a surgical intervention.  The patient's history has been reviewed, patient examined, no change in status, stable for surgery.  I have reviewed the patient's chart and labs.  Questions were answered to the patient's satisfaction.     Sylvia Cantrell

## 2019-07-04 NOTE — Transfer of Care (Signed)
Immediate Anesthesia Transfer of Care Note  Patient: Sylvia Cantrell  Procedure(s) Performed: HAND ASSISTED DISTAL PANCREATECTOMY/SPLENECTOMY (N/A Abdomen)  Patient Location: PACU  Anesthesia Type:General  Level of Consciousness: awake, alert  and oriented  Airway & Oxygen Therapy: Patient Spontanous Breathing and Patient connected to nasal cannula oxygen  Post-op Assessment: Report given to RN, Post -op Vital signs reviewed and stable and Patient moving all extremities  Post vital signs: Reviewed and stable  Last Vitals:  Vitals Value Taken Time  BP 126/70 07/04/19 1223  Temp    Pulse 81 07/04/19 1226  Resp 10 07/04/19 1226  SpO2 100 % 07/04/19 1226  Vitals shown include unvalidated device data.  Last Pain:  Vitals:   07/04/19 1223  TempSrc:   PainSc: (P) 7       Patients Stated Pain Goal: 2 (07/04/19 0704)  Complications: No apparent anesthesia complications

## 2019-07-04 NOTE — Anesthesia Procedure Notes (Signed)
Procedure Name: Intubation Date/Time: 07/04/2019 9:20 AM Performed by: Leonor Liv, CRNA Pre-anesthesia Checklist: Patient identified, Emergency Drugs available, Suction available and Patient being monitored Patient Re-evaluated:Patient Re-evaluated prior to induction Oxygen Delivery Method: Circle System Utilized Preoxygenation: Pre-oxygenation with 100% oxygen Induction Type: IV induction Ventilation: Mask ventilation without difficulty and Oral airway inserted - appropriate to patient size Laryngoscope Size: Mac and 3 Grade View: Grade I Tube type: Oral Tube size: 7.0 mm Number of attempts: 1 Airway Equipment and Method: Stylet and Oral airway Placement Confirmation: ETT inserted through vocal cords under direct vision,  positive ETCO2 and breath sounds checked- equal and bilateral Secured at: 21 cm Tube secured with: Tape Dental Injury: Teeth and Oropharynx as per pre-operative assessment

## 2019-07-04 NOTE — Op Note (Signed)
PREOPERATIVE DIAGNOSIS:  Distal pancreatic mass     POSTOPERATIVE DIAGNOSIS:  Same      PROCEDURE:  Diagnostic laparoscopy, laparoscopic hand-assisted distal   pancreatectomy and splenectomy.      SURGEON:  Stark Klein, MD      ASSISTANT:  Judyann Munson, RNFA     ANESTHESIA:  General and local.      FINDINGS:  Firm 3 cm mass in tail of pancreas     SPECIMEN:  Distal pancreas and spleen to Pathology.      ESTIMATED BLOOD LOSS:  161 mL      COMPLICATIONS:  None known.      PROCEDURE:  Patient was identified in the holding area, taken to   operating room, and placed supine on the operating room table.   General anesthesia was induced.  Foley catheter was placed.  Pt was   placed in the leaning spleen position, taking care to ensure appropriate padding.  The abdomen was prepped and   draped in sterile fashion.  Time-out was performed according to surgical   safety check list.  When all was correct, we continued.    The left subcostal region was anesthetized with local.  A 5 mm Optiview trocar was placed under direct visualization.   Pneumoperitoneum was achieved.   An additional 5 mm port was placed in the midline and then 2 were placed in the left upper quadrant as well.  The lienocolic ligament was   taken down with the Harmonic.  The lesser sac was opened with the Harmonic and all the adhesions were taken down.  Once the   stomach was completely off the spleen, the inferior border of the pancreas was identified and this was opened up with the Harmonic.  The   hand port was placed in the upper midline.  The pancreas and the splenic artery and vein were elevated.      The pancreatic artery was identified anteriorly and superiorly.  The 12 mm trocar was inserted into the gel port.  This was isolated from the pancreas and divided with the echelon white load.  The pancreatic vein was isolated off the pancreas posteriorly and was divided with a separate white load.  The pancreas was  then divided slowly with the echelon stapler blue load with the Endopath staple line reinforcement strips.  The posterior attachments were then taken off with the Harmonic scalpel and the spleen was disconnected from the   posterior attachments.  The specimen was then removed from the hand   port.  The area was inspected for hemostasis.  A lap was placed on the pancreatic stump temporarily.    The specimen was evaluated on the backtable.  It was sent for frozen section to evaluate margin and for diagnosis.  Margin was clear but diagnosis is deferred to permanent.    The abdomen was copiously irrigated and there was   no sign of any additional bleeding.  Vistaseal was placed over the tail of   the pancreas.   The 19 Blake drain was then passed into the abdomen through   the hand port and pulled out through one of the other 5 mm trocars.   This was placed in the appropriate location.    At this point, the other 5 mm trocars were removed and the fascia from the hand port was closed with  running #1 PDS suture.  The skin of the hand port was reapproximated with interrupted 3-0 vicryl.  The skin of all  the incisions was then closed with 4-0 Monocryl in a subcuticular fashion and then dressed   with soft sterile dressings.  The patient tolerated the procedure well.  Pt was extubated and taken to the PACU in stable condition.  Needle, sponge, and instrument counts were correct x2              Almond Lint, MD

## 2019-07-05 ENCOUNTER — Encounter: Payer: Self-pay | Admitting: *Deleted

## 2019-07-05 LAB — PHOSPHORUS: Phosphorus: 3.2 mg/dL (ref 2.5–4.6)

## 2019-07-05 LAB — CBC
HCT: 38.9 % (ref 36.0–46.0)
Hemoglobin: 12.5 g/dL (ref 12.0–15.0)
MCH: 27.8 pg (ref 26.0–34.0)
MCHC: 32.1 g/dL (ref 30.0–36.0)
MCV: 86.6 fL (ref 80.0–100.0)
Platelets: 297 10*3/uL (ref 150–400)
RBC: 4.49 MIL/uL (ref 3.87–5.11)
RDW: 13.8 % (ref 11.5–15.5)
WBC: 16.1 10*3/uL — ABNORMAL HIGH (ref 4.0–10.5)
nRBC: 0 % (ref 0.0–0.2)

## 2019-07-05 LAB — BASIC METABOLIC PANEL
Anion gap: 7 (ref 5–15)
BUN: <5 mg/dL — ABNORMAL LOW (ref 6–20)
CO2: 25 mmol/L (ref 22–32)
Calcium: 8.8 mg/dL — ABNORMAL LOW (ref 8.9–10.3)
Chloride: 104 mmol/L (ref 98–111)
Creatinine, Ser: 0.62 mg/dL (ref 0.44–1.00)
GFR calc Af Amer: >60 mL/min (ref >60)
GFR calc non Af Amer: >60 mL/min (ref >60)
Glucose, Bld: 150 mg/dL — ABNORMAL HIGH (ref 70–99)
Potassium: 3.7 mmol/L (ref 3.5–5.1)
Sodium: 136 mmol/L (ref 135–145)

## 2019-07-05 LAB — MAGNESIUM: Magnesium: 1.9 mg/dL (ref 1.7–2.4)

## 2019-07-05 NOTE — Discharge Instructions (Signed)
[facility]      [facility] °[phone] ° °ABDOMINAL SURGERY: POST OP INSTRUCTIONS ° °Always review your discharge instruction sheet given to you by the facility where your surgery was performed. ° °IF YOU HAVE DISABILITY OR FAMILY LEAVE FORMS, YOU MUST BRING THEM TO THE OFFICE FOR PROCESSING.  PLEASE DO NOT GIVE THEM TO YOUR DOCTOR. ° °1. A prescription for pain medication may be given to you upon discharge.  Take your pain medication as prescribed, if needed.  If narcotic pain medicine is not needed, then you may take acetaminophen (Tylenol) or ibuprofen (Advil) as needed. °2. Take your usually prescribed medications unless otherwise directed. °3. If you need a refill on your pain medication, please contact your pharmacy. They will contact our office to request authorization.  Prescriptions will not be filled after 5pm or on week-ends. °4. You should follow a light diet the first few days after arrival home, such as soup and crackers, pudding, etc.unless your doctor has advised otherwise. A high-fiber, low fat diet can be resumed as tolerated.   Be sure to include lots of fluids daily. Most patients will experience some swelling and bruising on the chest and neck area.  Ice packs will help.  Swelling and bruising can take several days to resolve °5. Most patients will experience some swelling and bruising in the area of the incision. Ice pack will help. Swelling and bruising can take several days to resolve..  °6. It is common to experience some constipation if taking pain medication after surgery.  Increasing fluid intake and taking a stool softener will usually help or prevent this problem from occurring.  A mild laxative (Milk of Magnesia or Miralax) should be taken according to package directions if there are no bowel movements after 48 hours. °7.  You may have steri-strips (small skin tapes) in place directly over the incision.  These strips should be left on the skin for 10-14 days.  If your  surgeon used skin glue on the incision, you may shower in 48 hours.  The glue will flake off over the next 2-3 weeks.  Any sutures or staples will be removed at the office during your follow-up visit. You may find that a light gauze bandage over your incision may keep your staples from being rubbed or pulled. You may shower and replace the bandage daily. °8. ACTIVITIES:  You may resume regular (light) daily activities beginning the next day--such as daily self-care, walking, climbing stairs--gradually increasing activities as tolerated.  You may have sexual intercourse when it is comfortable.  Refrain from any heavy lifting or straining until approved by your doctor. °a. You may drive when you no longer are taking prescription pain medication, you can comfortably wear a seatbelt, and you can safely maneuver your car and apply brakes °b. Return to Work: __________8 weeks if applicable_________________________ °9. You should see your doctor in the office for a follow-up appointment approximately two weeks after your surgery.  Make sure that you call for this appointment within a day or two after you arrive home to insure a convenient appointment time. °OTHER INSTRUCTIONS:  °_____________________________________________________________ °_____________________________________________________________ ° °WHEN TO CALL YOUR DOCTOR: °1. Fever over 101.0 °2. Inability to urinate °3. Nausea and/or vomiting °4. Extreme swelling or bruising °5. Continued bleeding from incision. °6. Increased pain, redness, or drainage from the incision. °7. Difficulty swallowing or breathing °8. Muscle cramping or spasms. °9. Numbness or tingling in hands or feet or around lips. ° °The clinic staff is   available to answer your questions during regular business hours.  Please don’t hesitate to call and ask to speak to one of the nurses if you have concerns. ° °For further questions, please visit [email or web address] ° ° ° °

## 2019-07-05 NOTE — Progress Notes (Signed)
1 Day Post-Op   Subjective/Chief Complaint: "had a rough night last night."  She got nauseated from drinking clear liquids and then had some LUQ pain.  She is feeling better this AM with oxycodone and morphine.     Objective: Vital signs in last 24 hours: Temp:  [97.7 F (36.5 C)-98.4 F (36.9 C)] 98.4 F (36.9 C) (03/25 0411) Pulse Rate:  [68-93] 93 (03/25 0411) Resp:  [8-16] 16 (03/25 0411) BP: (118-142)/(72-88) 142/88 (03/25 0411) SpO2:  [93 %-100 %] 100 % (03/25 0411) Last BM Date: 07/04/19  Intake/Output from previous day: 03/24 0701 - 03/25 0700 In: 1800 [I.V.:1800] Out: 2480 [Urine:2345; Drains:110; Blood:25] Intake/Output this shift: No intake/output data recorded.  General appearance: alert, cooperative and no distress Resp: breathing comfortably GI: soft, non distended, non tender.  drain serosang.   Extremities: extremities normal, atraumatic, no cyanosis or edema  Lab Results:  Recent Labs    07/04/19 0742 07/05/19 0206  WBC 5.9 16.1*  HGB 13.9 12.5  HCT 43.7 38.9  PLT 257 297   BMET Recent Labs    07/04/19 0742 07/05/19 0206  NA 138 136  K 3.6 3.7  CL 104 104  CO2 23 25  GLUCOSE 90 150*  BUN 9 <5*  CREATININE 0.82 0.62  CALCIUM 9.3 8.8*   PT/INR No results for input(s): LABPROT, INR in the last 72 hours. ABG No results for input(s): PHART, HCO3 in the last 72 hours.  Invalid input(s): PCO2, PO2  Studies/Results: No results found.  Anti-infectives: Anti-infectives (From admission, onward)   Start     Dose/Rate Route Frequency Ordered Stop   07/04/19 2100  ciprofloxacin (CIPRO) IVPB 400 mg     400 mg 200 mL/hr over 60 Minutes Intravenous Every 12 hours 07/04/19 1452 07/04/19 2109   07/04/19 0645  ciprofloxacin (CIPRO) IVPB 400 mg     400 mg 200 mL/hr over 60 Minutes Intravenous On call to O.R. 07/04/19 0639 07/04/19 0910      Assessment/Plan: s/p Procedure(s): HAND ASSISTED DISTAL PANCREATECTOMY/SPLENECTOMY (N/A) d/c  foley pain control  Pulmonary toilet Ambulate Await pathology   LOS: 1 day    Almond Lint 07/05/2019

## 2019-07-05 NOTE — Anesthesia Postprocedure Evaluation (Signed)
Anesthesia Post Note  Patient: Sylvia Cantrell  Procedure(s) Performed: HAND ASSISTED DISTAL PANCREATECTOMY/SPLENECTOMY (N/A Abdomen)     Patient location during evaluation: PACU Anesthesia Type: General Level of consciousness: awake and alert Pain management: pain level controlled Vital Signs Assessment: post-procedure vital signs reviewed and stable Respiratory status: spontaneous breathing, nonlabored ventilation, respiratory function stable and patient connected to nasal cannula oxygen Cardiovascular status: blood pressure returned to baseline and stable Postop Assessment: no apparent nausea or vomiting Anesthetic complications: no    Last Vitals:  Vitals:   07/04/19 2149 07/05/19 0411  BP: 136/83 (!) 142/88  Pulse: 91 93  Resp: 15 16  Temp: 36.8 C 36.9 C  SpO2: 94% 100%    Last Pain:  Vitals:   07/05/19 0815  TempSrc:   PainSc: 3                  Trevor Iha

## 2019-07-06 ENCOUNTER — Encounter (HOSPITAL_COMMUNITY): Payer: Self-pay | Admitting: *Deleted

## 2019-07-06 LAB — CBC
HCT: 36.5 % (ref 36.0–46.0)
Hemoglobin: 11.5 g/dL — ABNORMAL LOW (ref 12.0–15.0)
MCH: 27.8 pg (ref 26.0–34.0)
MCHC: 31.5 g/dL (ref 30.0–36.0)
MCV: 88.4 fL (ref 80.0–100.0)
Platelets: 331 10*3/uL (ref 150–400)
RBC: 4.13 MIL/uL (ref 3.87–5.11)
RDW: 14.5 % (ref 11.5–15.5)
WBC: 17.8 10*3/uL — ABNORMAL HIGH (ref 4.0–10.5)
nRBC: 0 % (ref 0.0–0.2)

## 2019-07-06 LAB — BASIC METABOLIC PANEL
Anion gap: 6 (ref 5–15)
BUN: 7 mg/dL (ref 6–20)
CO2: 26 mmol/L (ref 22–32)
Calcium: 8.4 mg/dL — ABNORMAL LOW (ref 8.9–10.3)
Chloride: 105 mmol/L (ref 98–111)
Creatinine, Ser: 0.73 mg/dL (ref 0.44–1.00)
GFR calc Af Amer: >60 mL/min (ref >60)
GFR calc non Af Amer: >60 mL/min (ref >60)
Glucose, Bld: 100 mg/dL — ABNORMAL HIGH (ref 70–99)
Potassium: 3.5 mmol/L (ref 3.5–5.1)
Sodium: 137 mmol/L (ref 135–145)

## 2019-07-06 LAB — SURGICAL PATHOLOGY

## 2019-07-06 MED ORDER — GABAPENTIN 100 MG PO CAPS: 100.0000 mg | ORAL_CAPSULE | Freq: Two times a day (BID) | ORAL | 1 refills | Status: DC

## 2019-07-06 MED ORDER — OXYCODONE HCL 5 MG PO TABS: 5.0000 mg | ORAL_TABLET | ORAL | 0 refills | Status: DC | PRN

## 2019-07-06 MED ORDER — METHOCARBAMOL 500 MG PO TABS: 500.0000 mg | ORAL_TABLET | Freq: Three times a day (TID) | ORAL | 0 refills | Status: DC | PRN

## 2019-07-06 NOTE — Progress Notes (Signed)
Noted increased temp 103.1, Md paged, awaiting orders. Patient alert and oriented, not in any distress, re instruct to use insentive spirometer.

## 2019-07-06 NOTE — Discharge Summary (Signed)
Physician Discharge Summary  Patient ID: Sylvia Cantrell MRN: [id number] DOB/AGE: January 04, 1976 44 y.o.  Admit date: 07/04/2019 Discharge date: 07/08/2019  Admission Diagnoses: Pancreatic mass  Discharge Diagnoses:  Active Problems:   Pancreatic mass   Discharged Condition: stable  Hospital Course:  Pt was admitted to the floor following a hand assisted lap distal pancreatectomy and splenectomy.  She had some significant nausea overnight on POD 0.  This improved.  She was a bit sore, but improving.  She was able to void spontaneously after foley removal. She was ambulatory.  She was able to transition to oral analgesics.  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  She was discharged on POD 4.  She was sent home with a drain.   Consults: None  Significant Diagnostic Studies: labs: see epic. WBCS elevated as expected following splenectomy  Treatments: surgery: see above.    Discharge Exam: Blood pressure 109/63, pulse 85, temperature 98.4 F (36.9 C), temperature source Oral, resp. rate 15, height 5\' 7"  (1.702 m), weight 112.9 kg, SpO2 94 %. General appearance: alert, cooperative and no distress Resp: breathing comfortably GI: soft, non distended.  drain serosang.   Extremities: extremities normal, atraumatic, no cyanosis or edema  Disposition:    Allergies as of 07/08/2019      Reactions   Amoxicillin Anaphylaxis, Hives, Swelling   Did it involve swelling of the face/tongue/throat, SOB, or low BP? Yes Did it involve sudden or severe rash/hives, skin peeling, or any reaction on the inside of your mouth or nose? Yes Did you need to seek medical attention at a hospital or doctor's office? Yes When did it last happen?3-4 Years If all above answers are "NO", may proceed with cephalosporin use.   Codeine Itching      Medication List    TAKE these medications   acetaminophen 325 MG tablet Commonly known as: TYLENOL Take 650 mg by mouth every 6 (six) hours as needed for  moderate pain or headache.   albuterol 108 (90 Base) MCG/ACT inhaler Commonly known as: VENTOLIN HFA Inhale 2 puffs into the lungs every 6 (six) hours as needed for wheezing or shortness of breath. What changed: when to take this   amLODipine 5 MG tablet Commonly known as: NORVASC Take 1 tablet (5 mg total) by mouth daily. What changed: when to take this   calcium carbonate 500 MG chewable tablet Commonly known as: TUMS - dosed in mg elemental calcium Chew 2 tablets by mouth daily as needed for indigestion or heartburn.   cetirizine 10 MG tablet Commonly known as: ZYRTEC Take 10 mg by mouth at bedtime.   escitalopram 5 MG tablet Commonly known as: Lexapro Take 1 tablet (5 mg total) by mouth daily. What changed: when to take this   fluconazole 200 MG tablet Commonly known as: DIFLUCAN Take 1 tablet (200 mg total) by mouth once for 1 dose.   fluticasone 50 MCG/ACT nasal spray Commonly known as: FLONASE Place 2 sprays into both nostrils daily. What changed:   when to take this  reasons to take this   gabapentin 100 MG capsule Commonly known as: NEURONTIN Take 1 capsule (100 mg total) by mouth 2 (two) times daily.   LORazepam 0.5 MG tablet Commonly known as: Ativan Take 1/2-1 tab PO BID prn   methocarbamol 500 MG tablet Commonly known as: Robaxin Take 1 tablet (500 mg total) by mouth every 8 (eight) hours as needed for muscle spasms.   ondansetron 4 MG disintegrating tablet Commonly known as: ZOFRAN-ODT  Take 1 tablet (4 mg total) by mouth every 8 (eight) hours as needed for nausea or vomiting.   oxyCODONE 5 MG immediate release tablet Commonly known as: Oxy IR/ROXICODONE Take 1-2 tablets (5-10 mg total) by mouth every 4 (four) hours as needed for moderate pain.      Follow-up Information    Almond Lint, MD Follow up in 2 week(s).   Specialty: General Surgery Contact information: [address] [place] [address] [phone]            Signed: Kandis Cocking 07/08/2019, 9:50 AM

## 2019-07-06 NOTE — Progress Notes (Signed)
2 Days Post-Op   Subjective/Chief Complaint: Pt feeling quite a bit better today.  She started passing gas and nausea resolved.  Pain is also better.  Low grade temp 100.9  Objective: Vital signs in last 24 hours: Temp:  [98.8 F (37.1 C)-100.9 F (38.3 C)] 98.8 F (37.1 C) (03/26 1013) Pulse Rate:  [88-109] 95 (03/26 1013) Resp:  [16-18] 18 (03/26 1013) BP: (103-132)/(58-75) 120/75 (03/26 1013) SpO2:  [91 %-99 %] 99 % (03/26 1013) Last BM Date: 07/04/19  Intake/Output from previous day: 03/25 0701 - 03/26 0700 In: 2210 [P.O.:960; I.V.:1200; IV Piggyback:50] Out: 1585 [Urine:1525; Drains:60] Intake/Output this shift: Total I/O In: -  Out: 675 [Urine:675]  General appearance: alert, cooperative and no distress Resp: breathing comfortably GI: soft, non distended, non tender.  drain serosang.   Extremities: extremities normal, atraumatic, no cyanosis or edema  Lab Results:  Recent Labs    07/05/19 0206 07/06/19 0928  WBC 16.1* 17.8*  HGB 12.5 11.5*  HCT 38.9 36.5  PLT 297 331   BMET Recent Labs    07/05/19 0206 07/06/19 0928  NA 136 137  K 3.7 3.5  CL 104 105  CO2 25 26  GLUCOSE 150* 100*  BUN <5* 7  CREATININE 0.62 0.73  CALCIUM 8.8* 8.4*   PT/INR No results for input(s): LABPROT, INR in the last 72 hours. ABG No results for input(s): PHART, HCO3 in the last 72 hours.  Invalid input(s): PCO2, PO2  Studies/Results: No results found.  Anti-infectives: Anti-infectives (From admission, onward)   Start     Dose/Rate Route Frequency Ordered Stop   07/04/19 2100  ciprofloxacin (CIPRO) IVPB 400 mg     400 mg 200 mL/hr over 60 Minutes Intravenous Every 12 hours 07/04/19 1452 07/04/19 2109   07/04/19 0645  ciprofloxacin (CIPRO) IVPB 400 mg     400 mg 200 mL/hr over 60 Minutes Intravenous On call to O.R. 07/04/19 4315 07/04/19 0910      Assessment/Plan: s/p Procedure(s): HAND ASSISTED DISTAL PANCREATECTOMY/SPLENECTOMY (N/A) Doing better.  Decrease  IV fluids. Advance diet. Work toward oral pain meds. Possibly home in the next day or so.    Pathology benign serous cystadenoma.     LOS: 2 days    Sylvia Cantrell 07/06/2019

## 2019-07-07 LAB — BASIC METABOLIC PANEL
Anion gap: 8 (ref 5–15)
BUN: 6 mg/dL (ref 6–20)
CO2: 24 mmol/L (ref 22–32)
Calcium: 8.7 mg/dL — ABNORMAL LOW (ref 8.9–10.3)
Chloride: 105 mmol/L (ref 98–111)
Creatinine, Ser: 0.8 mg/dL (ref 0.44–1.00)
GFR calc Af Amer: >60 mL/min (ref >60)
GFR calc non Af Amer: >60 mL/min (ref >60)
Glucose, Bld: 117 mg/dL — ABNORMAL HIGH (ref 70–99)
Potassium: 3.5 mmol/L (ref 3.5–5.1)
Sodium: 137 mmol/L (ref 135–145)

## 2019-07-07 LAB — CBC
HCT: 36.9 % (ref 36.0–46.0)
Hemoglobin: 11.8 g/dL — ABNORMAL LOW (ref 12.0–15.0)
MCH: 27.9 pg (ref 26.0–34.0)
MCHC: 32 g/dL (ref 30.0–36.0)
MCV: 87.2 fL (ref 80.0–100.0)
Platelets: 368 10*3/uL (ref 150–400)
RBC: 4.23 MIL/uL (ref 3.87–5.11)
RDW: 14.3 % (ref 11.5–15.5)
WBC: 16.4 10*3/uL — ABNORMAL HIGH (ref 4.0–10.5)
nRBC: 0 % (ref 0.0–0.2)

## 2019-07-07 NOTE — Progress Notes (Signed)
[facility] Office:  [phone] General Surgery Progress Note   LOS: 3 days  POD -  3 Days Post-Op  Assessment and Plan: 1.  HAND ASSISTED DISTAL PANCREATECTOMY/SPLENECTOMY - 3/24 [facility]  Pathology benign  Still does not feel good.  Will plan to keep another day.  Recheck CBC tomorrow  2.  Leukocytosis  WBC - 17,800 - 07/05/2019 3.  DVT prophylaxis - Lovenox   Active Problems:   Pancreatic mass  Subjective:  Still sore.  Poor po intake.  Does not quite feel ready to go home.  Objective:   Vitals:   07/07/19 0447 07/07/19 0836  BP: 117/72 128/79  Pulse: (!) 101 (!) 109  Resp: 16 18  Temp: (!) 100.4 F (38 C) (!) 101.9 F (38.8 C)  SpO2: 94% 95%     Intake/Output from previous day:  03/26 0701 - 03/27 0700 In: 120 [P.O.:120] Out: 975 [Urine:925; Drains:50]  Intake/Output this shift:  Total I/O In: -  Out: 250 [Urine:250]   Physical Exam:   General: WN obese WF who is alert and oriented.    HEENT: Normal. Pupils equal. .   Lungs: Clear.  Incentive spirometry - 1,200 cc   Abdomen: Soft, rare BS   Wound: Clean.  I removed the OnQ pump   Lab Results:    Recent Labs    07/05/19 0206 07/06/19 0928  WBC 16.1* 17.8*  HGB 12.5 11.5*  HCT 38.9 36.5  PLT 297 331    BMET   Recent Labs    07/05/19 0206 07/06/19 0928  NA 136 137  K 3.7 3.5  CL 104 105  CO2 25 26  GLUCOSE 150* 100*  BUN <5* 7  CREATININE 0.62 0.73  CALCIUM 8.8* 8.4*    PT/INR  No results for input(s): LABPROT, INR in the last 72 hours.  ABG  No results for input(s): PHART, HCO3 in the last 72 hours.  Invalid input(s): PCO2, PO2   Studies/Results:  No results found.   Anti-infectives:   Anti-infectives (From admission, onward)   Start     Dose/Rate Route Frequency Ordered Stop   07/04/19 2100  ciprofloxacin (CIPRO) IVPB 400 mg     400 mg 200 mL/hr over 60 Minutes Intravenous Every 12 hours 07/04/19 1452 07/04/19 2109   07/04/19 0645  ciprofloxacin (CIPRO)  IVPB 400 mg     400 mg 200 mL/hr over 60 Minutes Intravenous On call to O.R. 07/04/19 0639 07/04/19 0910      Ovidio Kin, MD, [facility] Surgery Office: [phone] 07/07/2019

## 2019-07-08 LAB — BASIC METABOLIC PANEL
Anion gap: 10 (ref 5–15)
BUN: 9 mg/dL (ref 6–20)
CO2: 25 mmol/L (ref 22–32)
Calcium: 8.5 mg/dL — ABNORMAL LOW (ref 8.9–10.3)
Chloride: 103 mmol/L (ref 98–111)
Creatinine, Ser: 0.81 mg/dL (ref 0.44–1.00)
GFR calc Af Amer: >60 mL/min (ref >60)
GFR calc non Af Amer: >60 mL/min (ref >60)
Glucose, Bld: 113 mg/dL — ABNORMAL HIGH (ref 70–99)
Potassium: 3.1 mmol/L — ABNORMAL LOW (ref 3.5–5.1)
Sodium: 138 mmol/L (ref 135–145)

## 2019-07-08 LAB — CBC
HCT: 32.5 % — ABNORMAL LOW (ref 36.0–46.0)
Hemoglobin: 10.2 g/dL — ABNORMAL LOW (ref 12.0–15.0)
MCH: 27.3 pg (ref 26.0–34.0)
MCHC: 31.4 g/dL (ref 30.0–36.0)
MCV: 87.1 fL (ref 80.0–100.0)
Platelets: 389 10*3/uL (ref 150–400)
RBC: 3.73 MIL/uL — ABNORMAL LOW (ref 3.87–5.11)
RDW: 14.4 % (ref 11.5–15.5)
WBC: 14.6 10*3/uL — ABNORMAL HIGH (ref 4.0–10.5)
nRBC: 0 % (ref 0.0–0.2)

## 2019-07-08 MED ORDER — FLUCONAZOLE 200 MG PO TABS: 200.0000 mg | ORAL_TABLET | Freq: Once | ORAL | 0 refills | Status: AC

## 2019-07-18 ENCOUNTER — Other Ambulatory Visit: Payer: Self-pay | Admitting: General Surgery

## 2019-07-18 DIAGNOSIS — K8689 Other specified diseases of pancreas: Secondary | ICD-10-CM

## 2019-07-18 DIAGNOSIS — R110 Nausea: Secondary | ICD-10-CM | POA: Diagnosis not present

## 2019-07-18 DIAGNOSIS — I10 Essential (primary) hypertension: Secondary | ICD-10-CM | POA: Diagnosis not present

## 2019-07-18 DIAGNOSIS — T888XXA Other specified complications of surgical and medical care, not elsewhere classified, initial encounter: Secondary | ICD-10-CM | POA: Diagnosis not present

## 2019-07-19 ENCOUNTER — Other Ambulatory Visit: Payer: Self-pay | Admitting: Family Medicine

## 2019-07-19 ENCOUNTER — Ambulatory Visit
Admission: RE | Admit: 2019-07-19 | Discharge: 2019-07-19 | Disposition: A | Discharge status: Home / Self Care | Payer: BC Managed Care – PPO | Source: Ambulatory Visit | Attending: General Surgery | Admitting: General Surgery

## 2019-07-19 ENCOUNTER — Other Ambulatory Visit: Payer: Self-pay

## 2019-07-19 DIAGNOSIS — K8689 Other specified diseases of pancreas: Secondary | ICD-10-CM | POA: Diagnosis not present

## 2019-07-19 DIAGNOSIS — R109 Unspecified abdominal pain: Secondary | ICD-10-CM | POA: Diagnosis not present

## 2019-07-19 MED ORDER — IOHEXOL 300 MG/ML  SOLN
100.0000 mL | Freq: Once | INTRAMUSCULAR | Status: AC | PRN
  Administered 2019-07-19: 10:00:00 100 mL via INTRAVENOUS

## 2019-07-20 ENCOUNTER — Other Ambulatory Visit (HOSPITAL_COMMUNITY): Payer: Self-pay | Admitting: General Surgery

## 2019-07-20 ENCOUNTER — Encounter (HOSPITAL_COMMUNITY): Payer: Self-pay | Admitting: Radiology

## 2019-07-20 DIAGNOSIS — T888XXA Other specified complications of surgical and medical care, not elsewhere classified, initial encounter: Secondary | ICD-10-CM

## 2019-07-20 NOTE — Progress Notes (Signed)
Sylvia Cantrell Female, 44 y.o., November 22, 1975 MRN:  [id number] Phone:  [phone] (M) ... PCP:  Myles Lipps, MD Coverage:  Valinda Hoar Blue [employer] Comm Ppo Next Appt With Family Medicine 08/14/2019 at 3:40 PM  RE: STAT   CT Drain Placement Received: Today Message Contents  Simonne Come, MD  Henry Russel D  No. Thx.       Previous Messages   ----- Message -----  From: Henry Russel D  Sent: 07/20/2019  2:49 PM EDT  To: Simonne Come, MD  Subject: RE: STAT   CT Drain Placement          Do I need to schedule a Covid Test also?  ----- Message -----  From: Simonne Come, MD  Sent: 07/20/2019  2:29 PM EDT  To: Servando Salina  Subject: RE: STAT   CT Drain Placement          OK for CT guided drain placement as per Dr. Donell Beers   First available either at [facility] or [facility].   Also, per Dr. Arita Miss note -> The surgical drain can be removed, it is not communication anymore with the fluid.   Nedra Hai   ----- Message -----  From: Henry Russel D  Sent: 07/20/2019 12:36 PM EDT  To: Simonne Come, MD  Subject: STAT   CT Drain Placement            Procedure: CT Drain Placement   Reason: Fluid collection at surgical site, initial encounter, Please set up for a percutaneous drain. The surgical drain can be removed, it is not communication anymore with the fluid.  07/04/19 Hand Assisted distal pancreatectomy/splenectomy.  Per order approved by Grace Isaac.   History: CT, MR in computer   Provider:  Almond Lint   Provider Contact: [phone]

## 2019-07-21 ENCOUNTER — Other Ambulatory Visit: Payer: Self-pay | Admitting: Radiology

## 2019-07-23 ENCOUNTER — Other Ambulatory Visit: Payer: Self-pay | Admitting: General Surgery

## 2019-07-23 ENCOUNTER — Other Ambulatory Visit: Payer: Self-pay

## 2019-07-23 ENCOUNTER — Ambulatory Visit (HOSPITAL_COMMUNITY)
Admission: RE | Admit: 2019-07-23 | Discharge: 2019-07-23 | Disposition: A | Discharge status: Home / Self Care | Payer: BC Managed Care – PPO | Source: Ambulatory Visit | Attending: General Surgery | Admitting: General Surgery

## 2019-07-23 ENCOUNTER — Encounter (HOSPITAL_COMMUNITY): Payer: Self-pay

## 2019-07-23 DIAGNOSIS — R188 Other ascites: Secondary | ICD-10-CM

## 2019-07-23 DIAGNOSIS — Z87891 Personal history of nicotine dependence: Secondary | ICD-10-CM | POA: Insufficient documentation

## 2019-07-23 DIAGNOSIS — T8143XA Infection following a procedure, organ and space surgical site, initial encounter: Secondary | ICD-10-CM | POA: Diagnosis not present

## 2019-07-23 DIAGNOSIS — T888XXA Other specified complications of surgical and medical care, not elsewhere classified, initial encounter: Secondary | ICD-10-CM | POA: Diagnosis not present

## 2019-07-23 LAB — CBC WITH DIFFERENTIAL/PLATELET
Abs Immature Granulocytes: 0.03 10*3/uL (ref 0.00–0.07)
Abs Immature Granulocytes: 0.04 10*3/uL (ref 0.00–0.07)
Basophils Absolute: 0.1 10*3/uL (ref 0.0–0.1)
Basophils Absolute: 0.1 10*3/uL (ref 0.0–0.1)
Basophils Relative: 1 %
Basophils Relative: 1 %
Eosinophils Absolute: 0.3 10*3/uL (ref 0.0–0.5)
Eosinophils Absolute: 0.4 10*3/uL (ref 0.0–0.5)
Eosinophils Relative: 3 %
Eosinophils Relative: 4 %
HCT: 35.2 % — ABNORMAL LOW (ref 36.0–46.0)
HCT: 35.3 % — ABNORMAL LOW (ref 36.0–46.0)
Hemoglobin: 10.9 g/dL — ABNORMAL LOW (ref 12.0–15.0)
Hemoglobin: 11.1 g/dL — ABNORMAL LOW (ref 12.0–15.0)
Immature Granulocytes: 0 %
Immature Granulocytes: 0 %
Lymphocytes Relative: 15 %
Lymphocytes Relative: 17 %
Lymphs Abs: 1.4 10*3/uL (ref 0.7–4.0)
Lymphs Abs: 1.8 10*3/uL (ref 0.7–4.0)
MCH: 27.2 pg (ref 26.0–34.0)
MCH: 27.4 pg (ref 26.0–34.0)
MCHC: 30.9 g/dL (ref 30.0–36.0)
MCHC: 31.5 g/dL (ref 30.0–36.0)
MCV: 86.9 fL (ref 80.0–100.0)
MCV: 88 fL (ref 80.0–100.0)
Monocytes Absolute: 1.1 10*3/uL — ABNORMAL HIGH (ref 0.1–1.0)
Monocytes Absolute: 1.2 10*3/uL — ABNORMAL HIGH (ref 0.1–1.0)
Monocytes Relative: 12 %
Monocytes Relative: 12 %
Neutro Abs: 6.4 10*3/uL (ref 1.7–7.7)
Neutro Abs: 7.1 10*3/uL (ref 1.7–7.7)
Neutrophils Relative %: 66 %
Neutrophils Relative %: 69 %
Platelets: 1001 10*3/uL (ref 150–400)
Platelets: 1003 10*3/uL (ref 150–400)
RBC: 4.01 MIL/uL (ref 3.87–5.11)
RBC: 4.05 MIL/uL (ref 3.87–5.11)
RDW: 14.8 % (ref 11.5–15.5)
RDW: 14.8 % (ref 11.5–15.5)
WBC: 10.6 10*3/uL — ABNORMAL HIGH (ref 4.0–10.5)
WBC: 9.3 10*3/uL (ref 4.0–10.5)
nRBC: 0 % (ref 0.0–0.2)
nRBC: 0 % (ref 0.0–0.2)

## 2019-07-23 LAB — PROTIME-INR
INR: 1 (ref 0.8–1.2)
Prothrombin Time: 13 seconds (ref 11.4–15.2)

## 2019-07-23 LAB — COMPREHENSIVE METABOLIC PANEL
ALT: 27 U/L (ref 0–44)
AST: 18 U/L (ref 15–41)
Albumin: 3.1 g/dL — ABNORMAL LOW (ref 3.5–5.0)
Alkaline Phosphatase: 152 U/L — ABNORMAL HIGH (ref 38–126)
Anion gap: 12 (ref 5–15)
BUN: 13 mg/dL (ref 6–20)
CO2: 21 mmol/L — ABNORMAL LOW (ref 22–32)
Calcium: 8.9 mg/dL (ref 8.9–10.3)
Chloride: 104 mmol/L (ref 98–111)
Creatinine, Ser: 0.79 mg/dL (ref 0.44–1.00)
GFR calc Af Amer: >60 mL/min (ref >60)
GFR calc non Af Amer: >60 mL/min (ref >60)
Glucose, Bld: 98 mg/dL (ref 70–99)
Potassium: 3.8 mmol/L (ref 3.5–5.1)
Sodium: 137 mmol/L (ref 135–145)
Total Bilirubin: 0.4 mg/dL (ref 0.3–1.2)
Total Protein: 7.2 g/dL (ref 6.5–8.1)

## 2019-07-23 LAB — PATHOLOGIST SMEAR REVIEW

## 2019-07-23 MED ORDER — SODIUM CHLORIDE 0.9% FLUSH: 5.0000 mL | Freq: Three times a day (TID) | INTRAVENOUS | Status: DC

## 2019-07-23 MED ORDER — MIDAZOLAM HCL 2 MG/2ML IJ SOLN
INTRAMUSCULAR | Status: AC | PRN
  Administered 2019-07-23 (×4): 1 mg via INTRAVENOUS

## 2019-07-23 MED ORDER — FENTANYL CITRATE (PF) 100 MCG/2ML IJ SOLN
INTRAMUSCULAR | Status: AC
  Filled 2019-07-23: qty 2

## 2019-07-23 MED ORDER — MIDAZOLAM HCL 2 MG/2ML IJ SOLN
INTRAMUSCULAR | Status: AC
  Filled 2019-07-23: qty 4

## 2019-07-23 MED ORDER — FENTANYL CITRATE (PF) 100 MCG/2ML IJ SOLN
INTRAMUSCULAR | Status: AC | PRN
  Administered 2019-07-23 (×2): 50 ug via INTRAVENOUS

## 2019-07-23 MED ORDER — FENTANYL CITRATE (PF) 100 MCG/2ML IJ SOLN
25.0000 ug | INTRAMUSCULAR | Status: AC
  Administered 2019-07-23: 25 ug via INTRAVENOUS

## 2019-07-23 MED ORDER — SODIUM CHLORIDE 0.9 % IV SOLN: INTRAVENOUS | Status: DC

## 2019-07-23 NOTE — Procedures (Signed)
Pre procedural Dx: Post op fluid collection Post procedural Dx: Same  Technically successful CT guided placed of a 10 Fr drainage catheter placement into the pancreatic bed fluid collection yielding 40 cc of purulent appearing fluid.    A representative aspirated sample was capped and sent to the laboratory for analysis.    EBL: Trace Complications: None immediate  Katherina Right, MD Pager #: [phone]

## 2019-07-23 NOTE — Discharge Instructions (Signed)
Please call Interventional Radiology clinic [phone] with any questions or concerns about your drain.  You may remove your dressing and shower tomorrow.  Change your dressing daily.  Flushing regimen: Normal Saline 5 mL three times daily.  #1 in left upper abdomen connected to JP bulb.   Record output every shift.   Moderate Conscious Sedation, Adult, Care After These instructions provide you with information about caring for yourself after your procedure. Your health care provider may also give you more specific instructions. Your treatment has been planned according to current medical practices, but problems sometimes occur. Call your health care provider if you have any problems or questions after your procedure. What can I expect after the procedure? After your procedure, it is common:  To feel sleepy for several hours.  To feel clumsy and have poor balance for several hours.  To have poor judgment for several hours.  To vomit if you eat too soon. Follow these instructions at home: For at least 24 hours after the procedure:   Do not: ? Participate in activities where you could fall or become injured. ? Drive. ? Use heavy machinery. ? Drink alcohol. ? Take sleeping pills or medicines that cause drowsiness. ? Make important decisions or sign legal documents. ? Take care of children on your own.  Rest. Eating and drinking  Follow the diet recommended by your health care provider.  If you vomit: ? Drink water, juice, or soup when you can drink without vomiting. ? Make sure you have little or no nausea before eating solid foods. General instructions  Have a responsible adult stay with you until you are awake and alert.  Take over-the-counter and prescription medicines only as told by your health care provider.  If you smoke, do not smoke without supervision.  Keep all follow-up visits as told by your health care provider. This is important. Contact a health care  provider if:  You keep feeling nauseous or you keep vomiting.  You feel light-headed.  You develop a rash.  You have a fever. Get help right away if:  You have trouble breathing. This information is not intended to replace advice given to you by your health care provider. Make sure you discuss any questions you have with your health care provider. Document Revised: 03/11/2017 Document Reviewed: 07/19/2015 Elsevier Patient Education  2020 [employer]   Percutaneous Drain, Care After This sheet gives you information about how to care for yourself after your procedure. Your health care provider may also give you more specific instructions. If you have problems or questions, contact your health care provider. What can I expect after the procedure? After your procedure, it is common to have:  A small amount of bruising and discomfort in the area where the drainage tube (catheter) was placed.  Sleepiness and fatigue. This should go away after the medicines you were given have worn off. Follow these instructions at home: Incision care  Follow instructions from your health care provider about how to take care of your incision. Make sure you: ? Wash your hands with soap and water before you change your bandage (dressing). If soap and water are not available, use hand sanitizer. ? Change your dressing as told by your health care provider. ? Leave stitches (sutures), skin glue, or adhesive strips in place. These skin closures may need to stay in place for 2 weeks or longer. If adhesive strip edges start to loosen and curl up, you may trim the loose edges. Do not  remove adhesive strips completely unless your health care provider tells you to do that.  Check your incision area every day for signs of infection. Check for: ? More redness, swelling, or pain. ? More fluid or blood. ? Warmth. ? Pus or a bad smell. ? Fluid leaking from around your catheter (instead of fluid draining through  your catheter). Catheter care   Follow instructions from your health care provider about emptying and cleaning your catheter and collection bag. You may need to clean the catheter every day so it does not clog.  If directed, write down the following information every time you empty your bag: ? The date and time. ? The amount of drainage. General instructions  Rest at home for 1-2 days after your procedure. Return to your normal activities as told by your health care provider.  Do not take baths, swim, or use a hot tub for 24 hours after your procedure, or until your health care provider says that this is okay.  Take over-the-counter and prescription medicines only as told by your health care provider.  Keep all follow-up visits as told by your health care provider. This is important. Contact a health care provider if:  You have less than 10 mL of drainage a day for 2-3 days in a row, or as directed by your health care provider.  You have more redness, swelling, or pain around your incision area.  You have more fluid or blood coming from your incision area.  Your incision area feels warm to the touch.  You have pus or a bad smell coming from your incision area.  You have fluid leaking from around your catheter (instead of through your catheter).  You have a fever or chills.  You have pain that does not get better with medicine. Get help right away if:  Your catheter comes out.  You suddenly stop having drainage from your catheter.  You suddenly have blood in the fluid that is draining from your catheter.  You become dizzy or you faint.  You develop a rash.  You have nausea or vomiting.  You have difficulty breathing or you feel short of breath.  You develop chest pain.  You have problems with your speech or vision.  You have trouble balancing or moving your arms or legs. Summary  It is common to have a small amount of bruising and discomfort in the area where  the drainage tube (catheter) was placed.  You may be directed to record the amount of drainage from the bag every time you empty it.  Follow instructions from your health care provider about emptying and cleaning your catheter and collection bag. This information is not intended to replace advice given to you by your health care provider. Make sure you discuss any questions you have with your health care provider. Document Revised: 03/11/2017 Document Reviewed: 02/19/2016 Elsevier Patient Education  [place] this sheet to all of your post-operative appointments while you have your drains.  Please measure your drains by CC's or ML's.  Make sure you drain and measure your JP Drains 3 times per day.  At the end of each day, add up totals for the left side and add up totals for the right side.    ( 9 am )     ( 3 pm )        ( 9 pm )  Date L amount   L amount   L amount   Total L

## 2019-07-23 NOTE — Consult Note (Addendum)
Chief Complaint: Patient was seen in consultation today for CT guided aspiration/drainage of abdominal fluid collection  Referring Physician(s): Byerly,Faera  Supervising Physician: Simonne Come  Patient Status: [facility] - Out-pt  History of Present Illness: Sylvia Cantrell is a 44 y.o. female with past medical history of asthma, anxiety/depression,, hypertension, IBS, nephrolithiasis, and seizures.  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  Spleen pathology was benign.  A left surgical abdominal drain was placed at the time of surgery which has recently stopped draining.  Due to persistent left-sided abdominal pain she underwent CT abdomen pelvis on 07/19/2019 which revealed:   1. Moderate size organized fluid collection at the resection margin of the pancreas which extends to the greater curvature of the stomach. 2. Percutaneous drainage catheter in the LEFT upper quadrant does not communicate with the fluid collection. 3. Post splenectomy without complication. 4. No evidence of pancreatitis in the body and head of the pancreas   She presents today for CT-guided aspiration/drainage of the abdominal fluid collection for further evaluation.  Past Medical History:  Diagnosis Date  . Allergy   . Anemia   . Anxiety   . Asthma   . Chronic headache   . Depression   . Hypertension   . IBS (irritable bowel syndrome)   . Kidney stones   . Nephrolithiasis   . Obesity   . Pancreatic mass   . Pneumonia   . Seizures (HCC)     Past Surgical History:  Procedure Laterality Date  . ABLATION    . BREAST SURGERY    . DILATION AND CURETTAGE OF UTERUS    . ESOPHAGOGASTRODUODENOSCOPY (EGD) WITH PROPOFOL N/A 05/24/2019   Procedure: ESOPHAGOGASTRODUODENOSCOPY (EGD) WITH PROPOFOL;  Surgeon: Rachael Fee, MD;  Location: [facility];  Service: Endoscopy;  Laterality: N/A;  category 2   . EUS N/A 05/24/2019   Procedure: UPPER  ENDOSCOPIC ULTRASOUND (EUS) RADIAL;  Surgeon: Rachael Fee, MD;  Location: [facility];  Service: Endoscopy;  Laterality: N/A;  category 2   . FINE NEEDLE ASPIRATION N/A 05/24/2019   Procedure: FINE NEEDLE ASPIRATION (FNA) LINEAR;  Surgeon: Rachael Fee, MD;  Location: [facility];  Service: Endoscopy;  Laterality: N/A;  . PANCREATECTOMY N/A 07/04/2019   Procedure: HAND ASSISTED DISTAL PANCREATECTOMY/SPLENECTOMY;  Surgeon: Almond Lint, MD;  Location: [facility] OR;  Service: General;  Laterality: N/A;  . TONSILLECTOMY  1987  . WISDOM TOOTH EXTRACTION      Allergies: Amoxicillin and Codeine  Medications: Prior to Admission medications   Medication Sig Start Date End Date Taking? Authorizing Provider  acetaminophen (TYLENOL) 325 MG tablet Take 650 mg by mouth every 6 (six) hours as needed for moderate pain or headache.    Yes [provider]  amLODipine (NORVASC) 5 MG tablet Take 1 tablet (5 mg total) by mouth daily. Patient taking differently: Take 5 mg by mouth at bedtime.  04/17/19  Yes Myles Lipps, MD  cetirizine (ZYRTEC) 10 MG tablet Take 10 mg by mouth at bedtime.   Yes [provider]  escitalopram (LEXAPRO) 5 MG tablet TAKE 1 TABLET(5 MG) BY MOUTH DAILY 07/19/19  Yes Myles Lipps, MD  fluticasone [facility]) 50 MCG/ACT nasal spray Place 2 sprays into both nostrils daily. Patient taking differently: Place 2 sprays into both nostrils daily as needed for allergies.  10/27/17  Yes Collie Siad A, MD  gabapentin (NEURONTIN) 100 MG capsule Take 1 capsule (100 mg total) by mouth 2 (two)  times daily. 07/06/19  Yes Stark Klein, MD  methocarbamol (ROBAXIN) 500 MG tablet Take 1 tablet (500 mg total) by mouth every 8 (eight) hours as needed for muscle spasms. 07/06/19  Yes Stark Klein, MD  ondansetron (ZOFRAN-ODT) 4 MG disintegrating tablet Take 1 tablet (4 mg total) by mouth every 8 (eight) hours as needed for nausea or vomiting. 04/28/19  Yes Nita Sells, MD    oxyCODONE (OXY IR/ROXICODONE) 5 MG immediate release tablet Take 1-2 tablets (5-10 mg total) by mouth every 4 (four) hours as needed for moderate pain. 07/06/19  Yes Stark Klein, MD  albuterol (VENTOLIN HFA) 108 (90 Base) MCG/ACT inhaler Inhale 2 puffs into the lungs every 6 (six) hours as needed for wheezing or shortness of breath. Patient taking differently: Inhale 2 puffs into the lungs every 4 (four) hours as needed for wheezing or shortness of breath.  04/17/19   Rutherford Guys, MD  calcium carbonate (TUMS - DOSED IN MG ELEMENTAL CALCIUM) 500 MG chewable tablet Chew 2 tablets by mouth daily as needed for indigestion or heartburn.    [provider]  LORazepam (ATIVAN) 0.5 MG tablet Take 1/2-1 tab PO BID prn Patient not taking: Reported on 06/28/2019 09/12/18   Maryruth Hancock, MD     Family History  Problem Relation Age of Onset  . Hypertension Mother   . Hyperthyroidism Mother   . Heart failure Father   . Heart failure Maternal Grandmother   . Hypertension Maternal Grandmother   . Kidney disease Maternal Grandmother   . Heart failure Paternal Grandfather     Social History   Socioeconomic History  . Marital status: Legally Separated    Spouse name: Not on file  . Number of children: Not on file  . Years of education: Not on file  . Highest education level: Not on file  Occupational History  . Not on file  Tobacco Use  . Smoking status: Former Smoker    Types: Cigarettes  . Smokeless tobacco: Never Used  . Tobacco comment: quit smoking in college  Substance and Sexual Activity  . Alcohol use: Not Currently    Alcohol/week: 0.0 standard drinks  . Drug use: No  . Sexual activity: Never  Other Topics Concern  . Not on file  Social History Narrative  . Not on file   Social Determinants of Health   Financial Resource Strain:   . Difficulty of Paying Living Expenses:   Food Insecurity:   . Worried About Charity fundraiser in the Last Year:   . Arboriculturist  in the Last Year:   Transportation Needs:   . Film/video editor (Medical):   Marland Kitchen Lack of Transportation (Non-Medical):   Physical Activity:   . Days of Exercise per Week:   . Minutes of Exercise per Session:   Stress:   . Feeling of Stress :   Social Connections:   . Frequency of Communication with Friends and Family:   . Frequency of Social Gatherings with Friends and Family:   . Attends Religious Services:   . Active Member of Clubs or Organizations:   . Attends Archivist Meetings:   Marland Kitchen Marital Status:      Review of Systems denies fever, headache, chest pain, cough, vomiting or bleeding.  She does have some dyspnea with exertion, left-sided abdominal pain and nausea.  Vital Signs: Blood pressure 137/84, temp 98.7, heart rate 93, respiration 18, O2 sat 94% room air   Physical Exam  awake, alert.  Chest clear to auscultation bilaterally.  Heart with regular rate and rhythm.  Abdomen obese, soft, positive bowel sounds, tender left abdominal region to palpation, left surgical drain in place with no significant fluid in drain bag; extremities with full range of motion.  Imaging: CT ABDOMEN PELVIS W CONTRAST  Result Date: 07/19/2019 CLINICAL DATA:  Postop laparoscopic distal pancreatectomy and splenectomy. Abdominal pain. EXAM: CT ABDOMEN AND PELVIS WITH CONTRAST TECHNIQUE: Multidetector CT imaging of the abdomen and pelvis was performed using the standard protocol following bolus administration of intravenous contrast. CONTRAST:  OMNIPAQUE IOHEXOL 300 MG/ML  SOLN COMPARISON:  CT 04/25/2019, MRI 04/26/2019 FINDINGS: Lower chest: Lung bases are clear. Hepatobiliary: No focal hepatic lesion. No biliary duct dilatation. Gallbladder is normal. Common bile duct is normal. Pancreas: Partial pancreatectomy with resection of the mass in tail of the pancreas. Along the resection margin there is a low-density fluid collection which measures 5.2 x 3.8 cm at the resection margin (image  22/2). The collection expands slightly superior as it approaches the greater curvature the stomach measuring 6.6 by 3.6 cm (image 20/2). Collection measures 4.75 cm in craniocaudad dimension. Collection has a thin rim of organization. Downstream of the resection margin there is no evidence of pancreatic duct dilatation. The body and head of pancreas normal. No pancreatic inflammation. There is a percutaneous drainage catheter entering the LEFT abdominal wall with tip terminating at the prior location of the spleen. The catheter does not communicate with the above described fluid collection Post splenectomy. Spleen: Post splenectomy. Adrenals/urinary tract: Adrenal glands and kidneys are normal. The ureters and bladder normal. Stomach/Bowel: Stomach, small bowel, appendix, and cecum are normal. Multiple diverticula of the descending colon and sigmoid colon without acute inflammation. Vascular/Lymphatic: Abdominal aorta is normal caliber. No periportal or retroperitoneal adenopathy. No pelvic adenopathy. Reproductive: Uterus and adnexa normal Other: Small volume free fluid the pelvis. Musculoskeletal: No aggressive osseous lesion. IMPRESSION: 1. Moderate size organized fluid collection at the resection margin of the pancreas which extends to the greater curvature of the stomach. 2. Percutaneous drainage catheter in the LEFT upper quadrant does not communicate with the fluid collection. 3. Post splenectomy without complication. 4. No evidence of pancreatitis in the body and head of the pancreas. Electronically Signed   By: Genevive Bi M.D.   On: 07/19/2019 15:46    Labs:  CBC: Recent Labs    07/07/19 1107 07/08/19 0332 07/23/19 0730 07/23/19 0801  WBC 16.4* 14.6* 10.6* 9.3  HGB 11.8* 10.2* 11.1* 10.9*  HCT 36.9 32.5* 35.2* 35.3*  PLT 368 389 1,003* 1,001*    COAGS: Recent Labs    04/25/19 2122 07/23/19 0730  INR 1.1 1.0  APTT 29  --     BMP: Recent Labs    07/06/19 0928 07/07/19 1107  07/08/19 0332 07/23/19 0730  NA 137 137 138 137  K 3.5 3.5 3.1* 3.8  CL 105 105 103 104  CO2 26 24 25  21*  GLUCOSE 100* 117* 113* 98  BUN 7 6 9 13   CALCIUM 8.4* 8.7* 8.5* 8.9  CREATININE 0.73 0.80 0.81 0.79  GFRNONAA >60 >60 >60 >60  GFRAA >60 >60 >60 >60    LIVER FUNCTION TESTS: Recent Labs    04/26/19 0508 04/27/19 0445 04/28/19 0532 07/23/19 0730  BILITOT 0.7 0.8 0.5 0.4  AST 29 25 17 18   ALT 33 39 30 27  ALKPHOS 69 70 82 152*  PROT 5.5* 5.4* 5.3* 7.2  ALBUMIN 2.9* 2.6* 2.7* 3.1*  TUMOR MARKERS: No results for input(s): AFPTM, CEA, CA199, CHROMGRNA in the last 8760 hours.  Assessment and Plan: 44 y.o. female with past medical history of asthma, anxiety/depression,, hypertension, IBS, nephrolithiasis, and seizures.  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  Spleen pathology was benign.  A left surgical abdominal drain was placed at the time of surgery which has recently stopped draining.  Due to persistent left-sided abdominal pain she underwent CT abdomen pelvis on 07/19/2019 which revealed:   1. Moderate size organized fluid collection at the resection margin of the pancreas which extends to the greater curvature of the stomach. 2. Percutaneous drainage catheter in the LEFT upper quadrant does not communicate with the fluid collection. 3. Post splenectomy without complication. 4. No evidence of pancreatitis in the body and head of the pancreas   She presents today for CT-guided aspiration/drainage of the abdominal fluid collection for further evaluation.Risks and benefits discussed with the patient including bleeding, infection, damage to adjacent structures, bowel perforation/fistula connection, and sepsis.  All of the patient's questions were answered, patient is agreeable to proceed. Consent signed and in chart.  Per surgical team may also remove surgical drain if no significant fluid remains  around catheter on today's CT. Dr. Donell Beers aware of patient's current platelet count (1003K)and states likely reactive.  Okay to proceed with procedure today.   Thank you for this interesting consult.  I greatly enjoyed meeting Marlee Armenteros and look forward to participating in their care.  A copy of this report was sent to the requesting provider on this date.  Electronically Signed: D. Jeananne Rama, PA-C 07/23/2019, 8:33 AM   I spent a total of  25 minutes   in face to face in clinical consultation, greater than 50% of which was counseling/coordinating care for CT-guided aspiration/drainage of abdominal fluid collection

## 2019-07-23 NOTE — Progress Notes (Signed)
  CRITICAL VALUE ALERT  Critical Value:  PLT 1003  Date & Time Notied:  07/23/2019  07:50  Provider Notified: Dr. Grace Isaac  Orders Received/Actions taken: None

## 2019-07-24 LAB — AMYLASE, BODY FLUID (OTHER): Amylase, Body Fluid: 6063 U/L

## 2019-07-28 LAB — AEROBIC/ANAEROBIC CULTURE W GRAM STAIN (SURGICAL/DEEP WOUND)
Culture: NO GROWTH
Special Requests: NORMAL

## 2019-08-01 ENCOUNTER — Encounter: Payer: Self-pay | Admitting: Radiology

## 2019-08-01 ENCOUNTER — Ambulatory Visit
Admission: RE | Admit: 2019-08-01 | Discharge: 2019-08-01 | Disposition: A | Discharge status: Home / Self Care | Payer: BC Managed Care – PPO | Source: Ambulatory Visit | Attending: General Surgery | Admitting: General Surgery

## 2019-08-01 DIAGNOSIS — R188 Other ascites: Secondary | ICD-10-CM

## 2019-08-01 DIAGNOSIS — D136 Benign neoplasm of pancreas: Secondary | ICD-10-CM | POA: Diagnosis not present

## 2019-08-01 DIAGNOSIS — K760 Fatty (change of) liver, not elsewhere classified: Secondary | ICD-10-CM | POA: Diagnosis not present

## 2019-08-01 HISTORY — PX: IR RADIOLOGIST EVAL & MGMT: IMG5224

## 2019-08-01 LAB — LIPASE, FLUID: Lipase-Fluid: 11312 U/L

## 2019-08-01 MED ORDER — IOPAMIDOL (ISOVUE-300) INJECTION 61%
100.0000 mL | Freq: Once | INTRAVENOUS | Status: AC | PRN
  Administered 2019-08-01: 100 mL via INTRAVENOUS

## 2019-08-01 NOTE — Progress Notes (Signed)
Patient ID: Sylvia Cantrell, female   DOB: 01-07-1976, 44 y.o.   MRN: [id number]       Chief Complaint:  Postop surgical drain placement, outpatient follow-up  Referring Physician(s): Byerly,Faera  History of Present Illness: Sylvia Cantrell is a 44 y.o. female who approximate 1 month ago underwent splenectomy and distal pancreatectomy.  Pancreas lesion pathology confirmed serous cystadenoma.  Postoperative CT confirmed a distal pancreatectomy bed fluid collection.  This underwent percutaneous drainage for 1221.  Since placement, the catheter has been flushed daily and to external suctionable.  Cultures were negative.  Patient remains on oral antibiotics.  Overall she is doing very well and recovering at home.  Repeat CT today confirms resolution of the abscess.  Stable drain catheter position.  No new collections.  Past Medical History:  Diagnosis Date  . Allergy   . Anemia   . Anxiety   . Asthma   . Chronic headache   . Depression   . Hypertension   . IBS (irritable bowel syndrome)   . Kidney stones   . Nephrolithiasis   . Obesity   . Pancreatic mass   . Pneumonia   . Seizures (HCC)     Past Surgical History:  Procedure Laterality Date  . ABLATION    . BREAST SURGERY    . DILATION AND CURETTAGE OF UTERUS    . ESOPHAGOGASTRODUODENOSCOPY (EGD) WITH PROPOFOL N/A 05/24/2019   Procedure: ESOPHAGOGASTRODUODENOSCOPY (EGD) WITH PROPOFOL;  Surgeon: Rachael Fee, MD;  Location: [facility];  Service: Endoscopy;  Laterality: N/A;  category 2   . EUS N/A 05/24/2019   Procedure: UPPER ENDOSCOPIC ULTRASOUND (EUS) RADIAL;  Surgeon: Rachael Fee, MD;  Location: [facility];  Service: Endoscopy;  Laterality: N/A;  category 2   . FINE NEEDLE ASPIRATION N/A 05/24/2019   Procedure: FINE NEEDLE ASPIRATION (FNA) LINEAR;  Surgeon: Rachael Fee, MD;  Location: [facility];  Service: Endoscopy;  Laterality: N/A;  . IR RADIOLOGIST EVAL & MGMT  08/01/2019  . PANCREATECTOMY N/A 07/04/2019    Procedure: HAND ASSISTED DISTAL PANCREATECTOMY/SPLENECTOMY;  Surgeon: Almond Lint, MD;  Location: [facility] OR;  Service: General;  Laterality: N/A;  . TONSILLECTOMY  1987  . WISDOM TOOTH EXTRACTION      Allergies: Amoxicillin and Codeine  Medications: Prior to Admission medications   Medication Sig Start Date End Date Taking? Authorizing Provider  acetaminophen (TYLENOL) 325 MG tablet Take 650 mg by mouth every 6 (six) hours as needed for moderate pain or headache.     [provider]  albuterol (VENTOLIN HFA) 108 (90 Base) MCG/ACT inhaler Inhale 2 puffs into the lungs every 6 (six) hours as needed for wheezing or shortness of breath. Patient taking differently: Inhale 2 puffs into the lungs every 4 (four) hours as needed for wheezing or shortness of breath.  04/17/19   Myles Lipps, MD  amLODipine (NORVASC) 5 MG tablet Take 1 tablet (5 mg total) by mouth daily. Patient taking differently: Take 5 mg by mouth at bedtime.  04/17/19   Myles Lipps, MD  calcium carbonate (TUMS - DOSED IN MG ELEMENTAL CALCIUM) 500 MG chewable tablet Chew 2 tablets by mouth daily as needed for indigestion or heartburn.    [provider]  cetirizine (ZYRTEC) 10 MG tablet Take 10 mg by mouth at bedtime.    [provider]  escitalopram (LEXAPRO) 5 MG tablet TAKE 1 TABLET(5 MG) BY MOUTH DAILY 07/19/19   Myles Lipps, MD  fluticasone [facility]) 50 MCG/ACT nasal spray  Place 2 sprays into both nostrils daily. Patient taking differently: Place 2 sprays into both nostrils daily as needed for allergies.  10/27/17   Doristine Bosworth, MD  gabapentin (NEURONTIN) 100 MG capsule Take 1 capsule (100 mg total) by mouth 2 (two) times daily. 07/06/19   Almond Lint, MD  LORazepam (ATIVAN) 0.5 MG tablet Take 1/2-1 tab PO BID prn Patient not taking: Reported on 06/28/2019 09/12/18   Wandra Feinstein, MD  methocarbamol (ROBAXIN) 500 MG tablet Take 1 tablet (500 mg total) by mouth every 8 (eight) hours as needed  for muscle spasms. 07/06/19   Almond Lint, MD  ondansetron (ZOFRAN-ODT) 4 MG disintegrating tablet Take 1 tablet (4 mg total) by mouth every 8 (eight) hours as needed for nausea or vomiting. 04/28/19   Rhetta Mura, MD  oxyCODONE (OXY IR/ROXICODONE) 5 MG immediate release tablet Take 1-2 tablets (5-10 mg total) by mouth every 4 (four) hours as needed for moderate pain. 07/06/19   Almond Lint, MD     Family History  Problem Relation Age of Onset  . Hypertension Mother   . Hyperthyroidism Mother   . Heart failure Father   . Heart failure Maternal Grandmother   . Hypertension Maternal Grandmother   . Kidney disease Maternal Grandmother   . Heart failure Paternal Grandfather     Social History   Socioeconomic History  . Marital status: Legally Separated    Spouse name: Not on file  . Number of children: Not on file  . Years of education: Not on file  . Highest education level: Not on file  Occupational History  . Not on file  Tobacco Use  . Smoking status: Former Smoker    Types: Cigarettes  . Smokeless tobacco: Never Used  . Tobacco comment: quit smoking in college  Substance and Sexual Activity  . Alcohol use: Not Currently    Alcohol/week: 0.0 standard drinks  . Drug use: No  . Sexual activity: Never  Other Topics Concern  . Not on file  Social History Narrative  . Not on file   Social Determinants of Health   Financial Resource Strain:   . Difficulty of Paying Living Expenses:   Food Insecurity:   . Worried About Programme researcher, broadcasting/film/video in the Last Year:   . Barista in the Last Year:   Transportation Needs:   . Freight forwarder (Medical):   Marland Kitchen Lack of Transportation (Non-Medical):   Physical Activity:   . Days of Exercise per Week:   . Minutes of Exercise per Session:   Stress:   . Feeling of Stress :   Social Connections:   . Frequency of Communication with Friends and Family:   . Frequency of Social Gatherings with Friends and Family:   .  Attends Religious Services:   . Active Member of Clubs or Organizations:   . Attends Banker Meetings:   Marland Kitchen Marital Status:      Review of Systems: A 12 point ROS discussed and pertinent positives are indicated in the HPI above.  All other systems are negative.  Review of Systems  Vital Signs: BP (!) 127/56   Pulse 85   Temp 98.2 F (36.8 C)   SpO2 98%   Physical Exam Constitutional:      General: She is not in acute distress.    Appearance: She is not ill-appearing or toxic-appearing.  Eyes:     General: No scleral icterus.    Conjunctiva/sclera: Conjunctivae normal.  Abdominal:     General: There is no distension.     Palpations: Abdomen is soft.     Comments: Drain catheter site is clean, dry and intact.  Neurological:     Mental Status: Mental status is at baseline.  Psychiatric:        Mood and Affect: Mood normal.        Thought Content: Thought content normal.      Imaging: CT ABDOMEN PELVIS W CONTRAST  Result Date: 08/01/2019 CLINICAL DATA:  Postop fluid collection at the distal pancreatectomy site. Status post percutaneous drain. EXAM: CT ABDOMEN AND PELVIS WITH CONTRAST TECHNIQUE: Multidetector CT imaging of the abdomen and pelvis was performed using the standard protocol following bolus administration of intravenous contrast. CONTRAST:  100mL ISOVUE-300 IOPAMIDOL (ISOVUE-300) INJECTION 61% COMPARISON:  None. FINDINGS: Lower chest: Minimal basilar atelectasis. Normal heart size. No pericardial or pleural effusion. Hepatobiliary: Patchy geographic fatty infiltration of the liver with areas of fatty sparing noted. No intrahepatic biliary dilatation or focal hepatic abnormality. Hepatic and portal veins are patent. Gallbladder nondistended. Common bile duct unremarkable. Pancreas: Status post distal pancreatectomy. Stable surgical drain at the distal pancreatectomy surgical bed. Resolved fluid collection. No additional fluid collections demonstrated. No  ductal dilatation. Residual pancreas enhances normally. Spleen: Status post splenectomy. Adrenals/Urinary Tract: Normal adrenal glands. No focal renal abnormality or obstruction. No hydronephrosis or hydroureter. Ureters are symmetric and decompressed urinary bladder unremarkable. Stomach/Bowel: Negative for bowel obstruction, significant dilatation, ileus, or free air. Scattered colonic diverticulosis without acute inflammatory process. No new fluid collections, abscess, hemorrhage, hematoma, or ascites. Vascular/Lymphatic: Intact aorta. Negative for aneurysm or acute vascular process. Mesenteric and renal vasculature all remain patent. No bulky adenopathy. Reproductive: Uterus and bilateral adnexa are unremarkable. Other: Stable postop changes in the left upper quadrant as well as along the anterior omentum from a previous surgical drain which has been removed. Musculoskeletal: No acute osseous finding. IMPRESSION: Resolved postop fluid collection at the distal pancreatectomy surgical site. Stable drain catheter. No new fluid collections. No other acute intra-abdominal or pelvic finding. Electronically Signed   By: Judie PetitM.  Shekelia Boutin M.D.   On: 08/01/2019 10:54   CT ABDOMEN PELVIS W CONTRAST  Result Date: 07/19/2019 CLINICAL DATA:  Postop laparoscopic distal pancreatectomy and splenectomy. Abdominal pain. EXAM: CT ABDOMEN AND PELVIS WITH CONTRAST TECHNIQUE: Multidetector CT imaging of the abdomen and pelvis was performed using the standard protocol following bolus administration of intravenous contrast. CONTRAST:  100mL OMNIPAQUE IOHEXOL 300 MG/ML  SOLN COMPARISON:  CT 04/25/2019, MRI 04/26/2019 FINDINGS: Lower chest: Lung bases are clear. Hepatobiliary: No focal hepatic lesion. No biliary duct dilatation. Gallbladder is normal. Common bile duct is normal. Pancreas: Partial pancreatectomy with resection of the mass in tail of the pancreas. Along the resection margin there is a low-density fluid collection which measures  5.2 x 3.8 cm at the resection margin (image 22/2). The collection expands slightly superior as it approaches the greater curvature the stomach measuring 6.6 by 3.6 cm (image 20/2). Collection measures 4.75 cm in craniocaudad dimension. Collection has a thin rim of organization. Downstream of the resection margin there is no evidence of pancreatic duct dilatation. The body and head of pancreas normal. No pancreatic inflammation. There is a percutaneous drainage catheter entering the LEFT abdominal wall with tip terminating at the prior location of the spleen. The catheter does not communicate with the above described fluid collection Post splenectomy. Spleen: Post splenectomy. Adrenals/urinary tract: Adrenal glands and kidneys are normal. The ureters and bladder normal. Stomach/Bowel:  Stomach, small bowel, appendix, and cecum are normal. Multiple diverticula of the descending colon and sigmoid colon without acute inflammation. Vascular/Lymphatic: Abdominal aorta is normal caliber. No periportal or retroperitoneal adenopathy. No pelvic adenopathy. Reproductive: Uterus and adnexa normal Other: Small volume free fluid the pelvis. Musculoskeletal: No aggressive osseous lesion. IMPRESSION: 1. Moderate size organized fluid collection at the resection margin of the pancreas which extends to the greater curvature of the stomach. 2. Percutaneous drainage catheter in the LEFT upper quadrant does not communicate with the fluid collection. 3. Post splenectomy without complication. 4. No evidence of pancreatitis in the body and head of the pancreas. Electronically Signed   By: Suzy Bouchard M.D.   On: 07/19/2019 15:46   CT IMAGE GUIDED DRAINAGE BY PERCUTANEOUS CATHETER  Result Date: 07/23/2019 INDICATION: History of distal pancreatectomy and splenectomy, now with indeterminate fluid collection within the left upper abdominal quadrant. Patient presents today for CT guided pancreatic bed aspiration and/or drainage catheter  placement Additionally, given lack of output from remaining surgical drainage catheter, request made for removal of large bore surgical drain. EXAM: CT IMAGE GUIDED DRAINAGE BY PERCUTANEOUS CATHETER COMPARISON:  CT abdomen and pelvis-07/19/2019 MEDICATIONS: The patient is currently admitted to the hospital and receiving intravenous antibiotics. The antibiotics were administered within an appropriate time frame prior to the initiation of the procedure. ANESTHESIA/SEDATION: Moderate (conscious) sedation was employed during this procedure. A total of Versed 4 mg and Fentanyl 100 mcg was administered intravenously. Moderate Sedation Time: 19 minutes. The patient's level of consciousness and vital signs were monitored continuously by radiology nursing throughout the procedure under my direct supervision. CONTRAST:  None COMPLICATIONS: None immediate. PROCEDURE: Informed written consent was obtained from the patient after a discussion of the risks, benefits and alternatives to treatment. The patient was placed supine on the CT gantry and a pre procedural CT was performed re-demonstrating the known abscess/fluid collection within the pancreatic bed with dominant component measuring approximately 4.4 x 3.8 cm (image 21, series 2). The procedure was planned. A timeout was performed prior to the initiation of the procedure. The skin overlying the lateral aspect the left upper abdomen was prepped and draped in the usual sterile fashion. The overlying soft tissues were anesthetized with 1% lidocaine with epinephrine. Appropriate trajectory was planned with the use of a 22 gauge spinal needle. An 18 gauge trocar needle was advanced into the abscess/fluid collection and a short Amplatz super stiff wire was coiled within the collection. Appropriate positioning was confirmed with a limited CT scan. The tract was serially dilated allowing placement of a 10 [place] all-purpose drainage catheter. Appropriate positioning was confirmed  with a limited postprocedural CT scan. Next, approximately 40 ml of purulent fluid was aspirated. The tube was connected to a JP bulb and sutured in place. A sample of aspirated fluid was capped and sent to the laboratory for culture analysis as well as amylase and lipase levels. Next, the retention suture around the large bore surgical drainage catheter was cut and drainage catheter was removed intact. Dressings were applied. The patient tolerated the procedure well without immediate post procedural complication. IMPRESSION: 1. Successful CT guided placement of a 10 [place] all purpose drain catheter into the pancreatic bed with aspiration of 40 mL of purulent fluid. Samples were sent to the laboratory for culture analysis as well as amylase and lipase levels. 2. Successful bedside removal of large bore surgical drainage catheter. PLAN: - The patient instructed to flush the drainage catheter with 10 cc of saline twice  per day and to maintain diligent records regarding daily drainage catheter output. - The patient will present to the interventional radiology drain clinic next Tuesday (4/20) for IV only abdominal CT and drainage catheter evaluation and management. Electronically Signed   By: Simonne Come M.D.   On: 07/23/2019 12:04   IR Radiologist Eval & Mgmt  Result Date: 08/01/2019 Please refer to notes tab for details about interventional procedure. (Op Note)   Labs:  CBC: Recent Labs    07/07/19 1107 07/08/19 0332 07/23/19 0730 07/23/19 0801  WBC 16.4* 14.6* 10.6* 9.3  HGB 11.8* 10.2* 11.1* 10.9*  HCT 36.9 32.5* 35.2* 35.3*  PLT 368 389 1,003* 1,001*    COAGS: Recent Labs    04/25/19 2122 07/23/19 0730  INR 1.1 1.0  APTT 29  --     BMP: Recent Labs    07/06/19 0928 07/07/19 1107 07/08/19 0332 07/23/19 0730  NA 137 137 138 137  K 3.5 3.5 3.1* 3.8  CL 105 105 103 104  CO2 26 24 25  21*  GLUCOSE 100* 117* 113* 98  BUN 7 6 9 13   CALCIUM 8.4* 8.7* 8.5* 8.9  CREATININE 0.73  0.80 0.81 0.79  GFRNONAA >60 >60 >60 >60  GFRAA >60 >60 >60 >60    LIVER FUNCTION TESTS: Recent Labs    04/26/19 0508 04/27/19 0445 04/28/19 0532 07/23/19 0730  BILITOT 0.7 0.8 0.5 0.4  AST 29 25 17 18   ALT 33 39 30 27  ALKPHOS 69 70 82 152*  PROT 5.5* 5.4* 5.3* 7.2  ALBUMIN 2.9* 2.6* 2.7* 3.1*     Assessment and Plan:  Resolved postop fluid collection at the distal pancreatectomy surgical bed.  Stable drain catheter position.  Fluoroscopic injection confirms no fistula to any adjacent vital structures including the pancreatic duct.  Plan: Drain catheter removed today.  Continue outpatient surgical follow-up with Dr. 04/30/19.   Electronically Signed: 09/22/19 08/01/2019, 11:44 AM   I spent a total of    25 Minutes in face to face in clinical consultation, greater than 50% of which was counseling/coordinating care for this patient with a postop fluid collection drain

## 2019-08-04 DIAGNOSIS — K8689 Other specified diseases of pancreas: Secondary | ICD-10-CM | POA: Diagnosis not present

## 2019-08-14 ENCOUNTER — Other Ambulatory Visit: Payer: Self-pay

## 2019-08-14 ENCOUNTER — Encounter: Payer: Self-pay | Admitting: Family Medicine

## 2019-08-14 ENCOUNTER — Ambulatory Visit (INDEPENDENT_AMBULATORY_CARE_PROVIDER_SITE_OTHER): Payer: BC Managed Care – PPO | Admitting: Family Medicine

## 2019-08-14 VITALS — BP 125/81 | HR 62 | Temp 98.0°F | Ht 67.0 in | Wt 245.0 lb

## 2019-08-14 DIAGNOSIS — Z90411 Acquired partial absence of pancreas: Secondary | ICD-10-CM | POA: Insufficient documentation

## 2019-08-14 DIAGNOSIS — L2082 Flexural eczema: Secondary | ICD-10-CM

## 2019-08-14 DIAGNOSIS — Z9081 Acquired absence of spleen: Secondary | ICD-10-CM

## 2019-08-14 DIAGNOSIS — K8689 Other specified diseases of pancreas: Secondary | ICD-10-CM

## 2019-08-14 DIAGNOSIS — I10 Essential (primary) hypertension: Secondary | ICD-10-CM | POA: Insufficient documentation

## 2019-08-14 MED ORDER — CLOBETASOL PROPIONATE 0.05 % EX CREA: 1.0000 "application " | TOPICAL_CREAM | Freq: Two times a day (BID) | CUTANEOUS | 2 refills | Status: DC

## 2019-08-14 MED ORDER — ESCITALOPRAM OXALATE 5 MG PO TABS: ORAL_TABLET | ORAL | 1 refills | Status: AC

## 2019-08-14 NOTE — Patient Instructions (Signed)

## 2019-08-14 NOTE — Progress Notes (Signed)
5/4/20214:14 PM  Sylvia Cantrell 1975-10-20, 44 y.o., female [id number]  Chief Complaint  Patient presents with  . Hypertension  . Gastroesophageal Reflux  . spleenectomy    and partial pancreas mass removal     HPI:   Patient is a 44 y.o. female with past medical history significant for HTN, GERD, GAD, asthma who presents today for followup  Last OV feb 2021 Had surgery for pancreatic mass (distal pancreatectomy/splenectomy) on 07/04/2019 bx pathology Had tubes removed, back to work next week Surg - Dr Almond Lint She was given pancreatic enzymes If she does not take them at all she has very loose stool Has elevated plts - started her on ASA and SCD at home Her surgeon is managing this  Lab Results  Component Value Date   WBC 9.3 07/23/2019   HGB 10.9 (L) 07/23/2019   HCT 35.3 (L) 07/23/2019   MCV 88.0 07/23/2019   PLT 1,001 (HH) 07/23/2019    Having flare up of eczema  Has large itchy patch on inner left elbow, has other patches on hips and ankles.   Lab Results  Component Value Date   CREATININE 0.79 07/23/2019   BUN 13 07/23/2019   NA 137 07/23/2019   K 3.8 07/23/2019   CL 104 07/23/2019   CO2 21 (L) 07/23/2019     Depression screen PHQ 2/9 08/14/2019 05/15/2019 05/07/2019  Decreased Interest 0 0 0  Down, Depressed, Hopeless 0 0 0  PHQ - 2 Score 0 0 0  Altered sleeping - - -  Tired, decreased energy - - -  Change in appetite - - -  Feeling bad or failure about yourself  - - -  Trouble concentrating - - -  Moving slowly or fidgety/restless - - -  Suicidal thoughts - - -  PHQ-9 Score - - -  Difficult doing work/chores - - -    Fall Risk  08/14/2019 05/15/2019 05/07/2019 04/17/2019 04/02/2019  Falls in the past year? 0 0 0 0 0  Number falls in past yr: 0 0 0 0 0  Injury with Fall? 0 0 0 - 0  Follow up Falls evaluation completed - - - Falls evaluation completed     Allergies  Allergen Reactions  . Amoxicillin Anaphylaxis, Hives and Swelling    Did it  involve swelling of the face/tongue/throat, SOB, or low BP? Yes Did it involve sudden or severe rash/hives, skin peeling, or any reaction on the inside of your mouth or nose? Yes Did you need to seek medical attention at a hospital or doctor's office? Yes When did it last happen?3-4 Years If all above answers are "NO", may proceed with cephalosporin use.  . Codeine Itching    Prior to Admission medications   Medication Sig Start Date End Date Taking? Authorizing Provider  acetaminophen (TYLENOL) 325 MG tablet Take 650 mg by mouth every 6 (six) hours as needed for moderate pain or headache.    Yes [provider]  albuterol (VENTOLIN HFA) 108 (90 Base) MCG/ACT inhaler Inhale 2 puffs into the lungs every 6 (six) hours as needed for wheezing or shortness of breath. Patient taking differently: Inhale 2 puffs into the lungs every 4 (four) hours as needed for wheezing or shortness of breath.  04/17/19  Yes Myles Lipps, MD  amLODipine (NORVASC) 5 MG tablet Take 1 tablet (5 mg total) by mouth daily. Patient taking differently: Take 5 mg by mouth at bedtime.  04/17/19  Yes Myles Lipps, MD  calcium carbonate (TUMS - DOSED IN MG ELEMENTAL CALCIUM) 500 MG chewable tablet Chew 2 tablets by mouth daily as needed for indigestion or heartburn.   Yes [provider]  cetirizine (ZYRTEC) 10 MG tablet Take 10 mg by mouth at bedtime.   Yes [provider]  escitalopram (LEXAPRO) 5 MG tablet TAKE 1 TABLET(5 MG) BY MOUTH DAILY 07/19/19  Yes Rutherford Guys, MD  fluticasone [facility]) 50 MCG/ACT nasal spray Place 2 sprays into both nostrils daily. Patient taking differently: Place 2 sprays into both nostrils daily as needed for allergies.  10/27/17  Yes Delia Chimes A, MD  gabapentin (NEURONTIN) 100 MG capsule Take 1 capsule (100 mg total) by mouth 2 (two) times daily. 07/06/19  Yes Stark Klein, MD  LORazepam (ATIVAN) 0.5 MG tablet Take 1/2-1 tab PO BID prn 09/12/18  Yes Corum,  Rex Kras, MD  methocarbamol (ROBAXIN) 500 MG tablet Take 1 tablet (500 mg total) by mouth every 8 (eight) hours as needed for muscle spasms. 07/06/19  Yes Stark Klein, MD  ondansetron (ZOFRAN-ODT) 4 MG disintegrating tablet Take 1 tablet (4 mg total) by mouth every 8 (eight) hours as needed for nausea or vomiting. 04/28/19  Yes Nita Sells, MD  oxyCODONE (OXY IR/ROXICODONE) 5 MG immediate release tablet Take 1-2 tablets (5-10 mg total) by mouth every 4 (four) hours as needed for moderate pain. Patient not taking: Reported on 08/14/2019 07/06/19   Stark Klein, MD  promethazine (PHENERGAN) 12.5 MG tablet Take 12.5-25 mg by mouth every 6 (six) hours as needed. 07/25/19   [provider]    Past Medical History:  Diagnosis Date  . Allergy   . Anemia   . Anxiety   . Asthma   . Chronic headache   . Depression   . Hypertension   . IBS (irritable bowel syndrome)   . Kidney stones   . Nephrolithiasis   . Obesity   . Pancreatic mass   . Pneumonia   . Seizures ([place])     Past Surgical History:  Procedure Laterality Date  . ABLATION    . BREAST SURGERY    . DILATION AND CURETTAGE OF UTERUS    . ESOPHAGOGASTRODUODENOSCOPY (EGD) WITH PROPOFOL N/A 05/24/2019   Procedure: ESOPHAGOGASTRODUODENOSCOPY (EGD) WITH PROPOFOL;  Surgeon: Milus Banister, MD;  Location: [facility];  Service: Endoscopy;  Laterality: N/A;  category 2   . EUS N/A 05/24/2019   Procedure: UPPER ENDOSCOPIC ULTRASOUND (EUS) RADIAL;  Surgeon: Milus Banister, MD;  Location: [facility];  Service: Endoscopy;  Laterality: N/A;  category 2   . FINE NEEDLE ASPIRATION N/A 05/24/2019   Procedure: FINE NEEDLE ASPIRATION (FNA) LINEAR;  Surgeon: Milus Banister, MD;  Location: [facility];  Service: Endoscopy;  Laterality: N/A;  . IR RADIOLOGIST EVAL & MGMT  08/01/2019  . PANCREATECTOMY N/A 07/04/2019   Procedure: HAND ASSISTED DISTAL PANCREATECTOMY/SPLENECTOMY;  Surgeon: Stark Klein, MD;  Location: [place];  Service:  General;  Laterality: N/A;  . TONSILLECTOMY  1987  . WISDOM TOOTH EXTRACTION      Social History   Tobacco Use  . Smoking status: Former Smoker    Types: Cigarettes  . Smokeless tobacco: Never Used  . Tobacco comment: quit smoking in college  Substance Use Topics  . Alcohol use: Not Currently    Alcohol/week: 0.0 standard drinks    Family History  Problem Relation Age of Onset  . Hypertension Mother   . Hyperthyroidism Mother   . Heart failure Father   .  Heart failure Maternal Grandmother   . Hypertension Maternal Grandmother   . Kidney disease Maternal Grandmother   . Heart failure Paternal Grandfather     ROS Per hpi  OBJECTIVE:  Today's Vitals   08/14/19 1551  BP: 125/81  Pulse: 62  Temp: 98 F (36.7 C)  SpO2: 98%  Weight: 245 lb (111.1 kg)  Height: 5\' 7"  (1.702 m)   Body mass index is 38.37 kg/m.   Physical Exam Vitals and nursing note reviewed.  Constitutional:      Appearance: She is well-developed.  HENT:     Head: Normocephalic and atraumatic.  Eyes:     General: No scleral icterus.    Conjunctiva/sclera: Conjunctivae normal.     Pupils: Pupils are equal, round, and reactive to light.  Pulmonary:     Effort: Pulmonary effort is normal.  Musculoskeletal:     Cervical back: Neck supple.  Skin:    General: Skin is warm and dry.  Neurological:     Mental Status: She is alert and oriented to person, place, and time.        No results found for this or any previous visit (from the past 24 hour(s)).  No results found.   ASSESSMENT and PLAN  1. Essential hypertension, benign Controlled. Continue current regime.   2. Pancreatic mass Benign path  3. S/P splenectomy 4. History of partial pancreatectomy Recovering well from surgery. Surgeon managing thrombocytosis and pancreatic insuffiencey.  5. Flexural eczema Discussed supportive measures, new meds r/se/b and RTC precautions. Patient educational handout given.  Other orders  -  escitalopram (LEXAPRO) 5 MG tablet; TAKE 1 TABLET(5 MG) BY MOUTH DAILY - clobetasol cream (TEMOVATE) 0.05 %; Apply 1 application topically 2 (two) times daily.  Return in about 6 months (around 02/14/2020).    13/07/2019, MD [facility] at [facility] [address] [place], [place] Ph.  [phone] Fax [phone]

## 2019-08-20 ENCOUNTER — Other Ambulatory Visit: Payer: Self-pay | Admitting: Family Medicine

## 2019-08-20 NOTE — Telephone Encounter (Signed)
Attempted to call patient and inform her medication was already sent to the pharmacy. No voicemail was available.

## 2019-09-03 DIAGNOSIS — K8689 Other specified diseases of pancreas: Secondary | ICD-10-CM | POA: Diagnosis not present

## 2019-09-11 DIAGNOSIS — Z124 Encounter for screening for malignant neoplasm of cervix: Secondary | ICD-10-CM | POA: Diagnosis not present

## 2019-09-11 DIAGNOSIS — Z1231 Encounter for screening mammogram for malignant neoplasm of breast: Secondary | ICD-10-CM | POA: Diagnosis not present

## 2019-09-11 DIAGNOSIS — Z01419 Encounter for gynecological examination (general) (routine) without abnormal findings: Secondary | ICD-10-CM | POA: Diagnosis not present

## 2019-09-11 DIAGNOSIS — Z6839 Body mass index (BMI) 39.0-39.9, adult: Secondary | ICD-10-CM | POA: Diagnosis not present

## 2020-01-17 ENCOUNTER — Other Ambulatory Visit: Payer: Self-pay | Admitting: Family Medicine

## 2020-02-11 ENCOUNTER — Ambulatory Visit: Payer: BC Managed Care – PPO | Admitting: Family Medicine

## 2020-02-11 ENCOUNTER — Ambulatory Visit (HOSPITAL_COMMUNITY): Payer: BC Managed Care – PPO

## 2020-02-11 ENCOUNTER — Ambulatory Visit (INDEPENDENT_AMBULATORY_CARE_PROVIDER_SITE_OTHER): Payer: BC Managed Care – PPO

## 2020-02-11 ENCOUNTER — Ambulatory Visit
Admission: EM | Admit: 2020-02-11 | Discharge: 2020-02-11 | Disposition: A | Discharge status: Home / Self Care | Payer: BC Managed Care – PPO | Attending: Emergency Medicine | Admitting: Emergency Medicine

## 2020-02-11 DIAGNOSIS — W108XXA Fall (on) (from) other stairs and steps, initial encounter: Secondary | ICD-10-CM

## 2020-02-11 DIAGNOSIS — M25571 Pain in right ankle and joints of right foot: Secondary | ICD-10-CM

## 2020-02-11 DIAGNOSIS — M7989 Other specified soft tissue disorders: Secondary | ICD-10-CM | POA: Diagnosis not present

## 2020-02-11 DIAGNOSIS — M79671 Pain in right foot: Secondary | ICD-10-CM | POA: Diagnosis not present

## 2020-02-11 MED ORDER — IBUPROFEN 800 MG PO TABS: 800.0000 mg | ORAL_TABLET | Freq: Three times a day (TID) | ORAL | 0 refills | Status: AC | PRN

## 2020-02-11 NOTE — ED Triage Notes (Signed)
Pt presents with complaints of right foot pain after falling last night. Reports she was coming down the steps and missed a step twisting her ankle and foot. Reports she initially could not bare any weight on her foot at all and now can. Pt has taken ibuprofen with minimal relief. Complaints of pain and swelling to the bottom and top of her foot and the inner area of her ankle.

## 2020-02-11 NOTE — Discharge Instructions (Addendum)
Take ibuprofen as needed.  Rest and elevate your foot/ankle.  Apply ice packs 2-3 times a day for up to 20 minutes each.  Wear the Ace wrap as needed for comfort.    Follow up with an orthopedist if you symptoms continue or worsen.

## 2020-02-11 NOTE — ED Provider Notes (Signed)
Renaldo Fiddler    CSN: [id number] Arrival date & time: 02/11/20  0909      History   Chief Complaint Chief Complaint  Patient presents with  . Fall    HPI Sylvia Cantrell is a 44 y.o. female.   Patient presents with pain and swelling in her right foot and ankle after falling last night.  She was coming down steps and missed one which caused her to fall.  Her pain is worse with weightbearing.  Treatment attempted at home with ibuprofen.  She denies numbness, paresthesias, weakness.  No open wounds.  No head injury or loss of consciousness.  Her medical history includes hypertension, partial pancreatectomy, splenectomy, asthma, kidney stones, anemia, anxiety, chronic headache, IBS, obesity, depression, seizures.  The history is provided by the patient.    Past Medical History:  Diagnosis Date  . Allergy   . Anemia   . Anxiety   . Asthma   . Chronic headache   . Depression   . Hypertension   . IBS (irritable bowel syndrome)   . Kidney stones   . Nephrolithiasis   . Obesity   . Pancreatic mass   . Pneumonia   . Seizures [facility])     Patient Active Problem List   Diagnosis Date Noted  . S/P splenectomy 08/14/2019  . Essential hypertension, benign 08/14/2019  . History of partial pancreatectomy 08/14/2019  . Sepsis due to undetermined organism (HCC) 04/26/2019  . Hypophosphatemia 04/26/2019  . Hypokalemia 04/26/2019  . Pancreatic mass 04/26/2019  . Pneumonia 04/25/2019  . Elevated blood pressure reading 09/12/2018  . Mild intermittent asthma without complication 09/12/2018  . Anemia 05/02/2018  . Neck pain 06/21/2017  . Anterior pleuritic pain 06/21/2017  . Iron deficiency anemia due to chronic blood loss 02/28/2017  . Situational anxiety 11/17/2016  . Menorrhagia with regular cycle 08/11/2016  . Anemia due to chronic blood loss 08/11/2016  . NEPHROLITHIASIS, HX OF 02/15/2010  . ALLERGIC RHINITIS 02/11/2010  . Asthma 02/11/2010    Past Surgical History:    Procedure Laterality Date  . ABLATION    . BREAST SURGERY    . DILATION AND CURETTAGE OF UTERUS    . ESOPHAGOGASTRODUODENOSCOPY (EGD) WITH PROPOFOL N/A 05/24/2019   Procedure: ESOPHAGOGASTRODUODENOSCOPY (EGD) WITH PROPOFOL;  Surgeon: Rachael Fee, MD;  Location: [facility];  Service: Endoscopy;  Laterality: N/A;  category 2   . EUS N/A 05/24/2019   Procedure: UPPER ENDOSCOPIC ULTRASOUND (EUS) RADIAL;  Surgeon: Rachael Fee, MD;  Location: [facility];  Service: Endoscopy;  Laterality: N/A;  category 2   . FINE NEEDLE ASPIRATION N/A 05/24/2019   Procedure: FINE NEEDLE ASPIRATION (FNA) LINEAR;  Surgeon: Rachael Fee, MD;  Location: [facility];  Service: Endoscopy;  Laterality: N/A;  . IR RADIOLOGIST EVAL & MGMT  08/01/2019  . PANCREATECTOMY N/A 07/04/2019   Procedure: HAND ASSISTED DISTAL PANCREATECTOMY/SPLENECTOMY;  Surgeon: Almond Lint, MD;  Location: [facility];  Service: General;  Laterality: N/A;  . TONSILLECTOMY  1987  . WISDOM TOOTH EXTRACTION      OB History   No obstetric history on file.      Home Medications    Prior to Admission medications   Medication Sig Start Date End Date Taking? Authorizing Provider  acetaminophen (TYLENOL) 325 MG tablet Take 650 mg by mouth every 6 (six) hours as needed for moderate pain or headache.     [provider]  albuterol (VENTOLIN HFA) 108 (90 Base) MCG/ACT inhaler Inhale 2 puffs into  the lungs every 6 (six) hours as needed for wheezing or shortness of breath. Patient taking differently: Inhale 2 puffs into the lungs every 4 (four) hours as needed for wheezing or shortness of breath.  04/17/19   Myles Lipps, MD  amLODipine (NORVASC) 5 MG tablet TAKE 1 TABLET(5 MG) BY MOUTH DAILY 01/17/20   Myles Lipps, MD  calcium carbonate (TUMS - DOSED IN MG ELEMENTAL CALCIUM) 500 MG chewable tablet Chew 2 tablets by mouth daily as needed for indigestion or heartburn.    [provider]  cetirizine (ZYRTEC) 10 MG tablet  Take 10 mg by mouth at bedtime.    [provider]  clobetasol cream (TEMOVATE) 0.05 % Apply 1 application topically 2 (two) times daily. 08/14/19   Myles Lipps, MD  escitalopram (LEXAPRO) 5 MG tablet TAKE 1 TABLET(5 MG) BY MOUTH DAILY 08/14/19   Myles Lipps, MD  fluticasone [facility]) 50 MCG/ACT nasal spray Place 2 sprays into both nostrils daily. Patient taking differently: Place 2 sprays into both nostrils daily as needed for allergies.  10/27/17   Doristine Bosworth, MD  gabapentin (NEURONTIN) 100 MG capsule Take 1 capsule (100 mg total) by mouth 2 (two) times daily. 07/06/19   Almond Lint, MD  ibuprofen (ADVIL) 800 MG tablet Take 1 tablet (800 mg total) by mouth every 8 (eight) hours as needed. 02/11/20   Mickie Bail, NP  LORazepam (ATIVAN) 0.5 MG tablet Take 1/2-1 tab PO BID prn 09/12/18   Corum, Minerva Fester, MD  methocarbamol (ROBAXIN) 500 MG tablet Take 1 tablet (500 mg total) by mouth every 8 (eight) hours as needed for muscle spasms. 07/06/19   Almond Lint, MD  ondansetron (ZOFRAN-ODT) 4 MG disintegrating tablet Take 1 tablet (4 mg total) by mouth every 8 (eight) hours as needed for nausea or vomiting. 04/28/19   Rhetta Mura, MD  promethazine (PHENERGAN) 12.5 MG tablet Take 12.5-25 mg by mouth every 6 (six) hours as needed. 07/25/19   [provider]    Family History Family History  Problem Relation Age of Onset  . Hypertension Mother   . Hyperthyroidism Mother   . Heart failure Father   . Heart failure Maternal Grandmother   . Hypertension Maternal Grandmother   . Kidney disease Maternal Grandmother   . Heart failure Paternal Grandfather     Social History Social History   Tobacco Use  . Smoking status: Former Smoker    Types: Cigarettes  . Smokeless tobacco: Never Used  . Tobacco comment: quit smoking in college  Vaping Use  . Vaping Use: Never used  Substance Use Topics  . Alcohol use: Not Currently    Alcohol/week: 0.0 standard drinks  .  Drug use: No     Allergies   Amoxicillin and Codeine   Review of Systems Review of Systems  Constitutional: Negative for chills and fever.  HENT: Negative for ear pain and sore throat.   Eyes: Negative for pain and visual disturbance.  Respiratory: Negative for cough and shortness of breath.   Cardiovascular: Negative for chest pain and palpitations.  Gastrointestinal: Negative for abdominal pain and vomiting.  Genitourinary: Negative for dysuria and hematuria.  Musculoskeletal: Positive for arthralgias, gait problem and joint swelling. Negative for back pain.  Skin: Negative for color change and rash.  Neurological: Negative for seizures, syncope, weakness and numbness.  All other systems reviewed and are negative.    Physical Exam Triage Vital Signs ED Triage Vitals  Enc Vitals Group  BP      Pulse      Resp      Temp      Temp src      SpO2      Weight      Height      Head Circumference      Peak Flow      Pain Score      Pain Loc      Pain Edu?      Excl. in GC?    No data found.  Updated Vital Signs BP 127/89   Pulse 87   Temp 99 F (37.2 C)   Resp 19   SpO2 96%   Visual Acuity Right Eye Distance:   Left Eye Distance:   Bilateral Distance:    Right Eye Near:   Left Eye Near:    Bilateral Near:     Physical Exam Vitals and nursing note reviewed.  Constitutional:      General: She is not in acute distress.    Appearance: She is well-developed.  HENT:     Head: Normocephalic and atraumatic.     Mouth/Throat:     Mouth: Mucous membranes are moist.  Eyes:     Conjunctiva/sclera: Conjunctivae normal.  Cardiovascular:     Rate and Rhythm: Normal rate and regular rhythm.     Heart sounds: No murmur heard.   Pulmonary:     Effort: Pulmonary effort is normal. No respiratory distress.     Breath sounds: Normal breath sounds.  Abdominal:     Palpations: Abdomen is soft.     Tenderness: There is no abdominal tenderness.  Musculoskeletal:         General: Swelling and tenderness present. No deformity.     Cervical back: Neck supple.       Feet:     Comments: Right ankle and foot: tender to palpation, mild edema, no open wounds, 2+ pulses, sensation intact, ROM of ankle limited by discomfort.    Skin:    General: Skin is warm and dry.     Capillary Refill: Capillary refill takes less than 2 seconds.     Findings: No bruising, erythema, lesion or rash.  Neurological:     General: No focal deficit present.     Mental Status: She is alert and oriented to person, place, and time.     Sensory: No sensory deficit.     Motor: No weakness.     Gait: Gait abnormal.  Psychiatric:        Mood and Affect: Mood normal.        Behavior: Behavior normal.      UC Treatments / Results  Labs (all labs ordered are listed, but only abnormal results are displayed) Labs Reviewed - No data to display  EKG   Radiology DG Ankle Complete Right  Result Date: 02/11/2020 CLINICAL DATA:  RIGHT foot pain after falling last night, missed a step coming down the steps and twisted her ankle and foot, initially could not bear weight, pain and swelling at top of foot and medial ankle EXAM: RIGHT ANKLE - COMPLETE 3+ VIEW; RIGHT FOOT COMPLETE - 3+ VIEW COMPARISON:  None FINDINGS: RIGHT foot: Osseous mineralization normal for technique. Joint spaces preserved. No acute fracture, dislocation, or bone destruction. RIGHT ankle: Soft tissue swelling RIGHT ankle extending to dorsum of foot. Joint spaces preserved. No acute fracture, dislocation, or bone destruction. IMPRESSION: No acute osseous abnormalities identified within RIGHT foot and RIGHT ankle. Electronically  Signed   By: Ulyses Southward M.D.   On: 02/11/2020 10:08   DG Foot Complete Right  Result Date: 02/11/2020 CLINICAL DATA:  RIGHT foot pain after falling last night, missed a step coming down the steps and twisted her ankle and foot, initially could not bear weight, pain and swelling at top of foot  and medial ankle EXAM: RIGHT ANKLE - COMPLETE 3+ VIEW; RIGHT FOOT COMPLETE - 3+ VIEW COMPARISON:  None FINDINGS: RIGHT foot: Osseous mineralization normal for technique. Joint spaces preserved. No acute fracture, dislocation, or bone destruction. RIGHT ankle: Soft tissue swelling RIGHT ankle extending to dorsum of foot. Joint spaces preserved. No acute fracture, dislocation, or bone destruction. IMPRESSION: No acute osseous abnormalities identified within RIGHT foot and RIGHT ankle. Electronically Signed   By: Ulyses Southward M.D.   On: 02/11/2020 10:08    Procedures Procedures (including critical care time)  Medications Ordered in UC Medications - No data to display  Initial Impression / Assessment and Plan / UC Course  I have reviewed the triage vital signs and the nursing notes.  Pertinent labs & imaging results that were available during my care of the patient were reviewed by me and considered in my medical decision making (see chart for details).   Pain of the right ankle and foot following injury.  X-ray of foot and ankle negative.  Treating with ibuprofen, rest, elevation, ice packs, Ace wrap, crutches.  Instructed patient to follow-up with orthopedics if her symptoms are not improving.  Patient agrees to plan of care.   Final Clinical Impressions(s) / UC Diagnoses   Final diagnoses:  Pain in joint involving right ankle and foot     Discharge Instructions     Take ibuprofen as needed.  Rest and elevate your foot/ankle.  Apply ice packs 2-3 times a day for up to 20 minutes each.  Wear the Ace wrap as needed for comfort.    Follow up with an orthopedist if you symptoms continue or worsen.           ED Prescriptions    Medication Sig Dispense Auth. Provider   ibuprofen (ADVIL) 800 MG tablet Take 1 tablet (800 mg total) by mouth every 8 (eight) hours as needed. 21 tablet Mickie Bail, NP     PDMP not reviewed this encounter.   Mickie Bail, NP 02/11/20 1032

## 2020-03-10 ENCOUNTER — Other Ambulatory Visit: Payer: Self-pay | Admitting: *Deleted

## 2020-03-10 MED ORDER — AMLODIPINE BESYLATE 5 MG PO TABS: ORAL_TABLET | ORAL | 0 refills | Status: DC

## 2020-04-22 ENCOUNTER — Other Ambulatory Visit: Payer: BC Managed Care – PPO

## 2020-06-30 ENCOUNTER — Ambulatory Visit (HOSPITAL_COMMUNITY)
Admission: EM | Admit: 2020-06-30 | Discharge: 2020-06-30 | Disposition: A | Discharge status: Home / Self Care | Payer: BC Managed Care – PPO | Attending: Emergency Medicine | Admitting: Emergency Medicine

## 2020-06-30 ENCOUNTER — Other Ambulatory Visit: Payer: Self-pay

## 2020-06-30 ENCOUNTER — Encounter (HOSPITAL_COMMUNITY): Payer: Self-pay | Admitting: Emergency Medicine

## 2020-06-30 DIAGNOSIS — H6692 Otitis media, unspecified, left ear: Secondary | ICD-10-CM | POA: Diagnosis not present

## 2020-06-30 DIAGNOSIS — R030 Elevated blood-pressure reading, without diagnosis of hypertension: Secondary | ICD-10-CM | POA: Insufficient documentation

## 2020-06-30 DIAGNOSIS — R059 Cough, unspecified: Secondary | ICD-10-CM | POA: Insufficient documentation

## 2020-06-30 DIAGNOSIS — E669 Obesity, unspecified: Secondary | ICD-10-CM | POA: Diagnosis not present

## 2020-06-30 DIAGNOSIS — R42 Dizziness and giddiness: Secondary | ICD-10-CM | POA: Diagnosis not present

## 2020-06-30 DIAGNOSIS — I10 Essential (primary) hypertension: Secondary | ICD-10-CM | POA: Insufficient documentation

## 2020-06-30 DIAGNOSIS — Z7901 Long term (current) use of anticoagulants: Secondary | ICD-10-CM | POA: Insufficient documentation

## 2020-06-30 DIAGNOSIS — Z20822 Contact with and (suspected) exposure to covid-19: Secondary | ICD-10-CM | POA: Insufficient documentation

## 2020-06-30 DIAGNOSIS — Z6837 Body mass index (BMI) 37.0-37.9, adult: Secondary | ICD-10-CM | POA: Diagnosis not present

## 2020-06-30 DIAGNOSIS — Z79899 Other long term (current) drug therapy: Secondary | ICD-10-CM | POA: Diagnosis not present

## 2020-06-30 DIAGNOSIS — Z87891 Personal history of nicotine dependence: Secondary | ICD-10-CM | POA: Diagnosis not present

## 2020-06-30 DIAGNOSIS — F419 Anxiety disorder, unspecified: Secondary | ICD-10-CM | POA: Diagnosis not present

## 2020-06-30 DIAGNOSIS — J029 Acute pharyngitis, unspecified: Secondary | ICD-10-CM | POA: Diagnosis not present

## 2020-06-30 DIAGNOSIS — J069 Acute upper respiratory infection, unspecified: Secondary | ICD-10-CM | POA: Diagnosis not present

## 2020-06-30 HISTORY — DX: Attention-deficit hyperactivity disorder, unspecified type: F90.9

## 2020-06-30 LAB — SARS CORONAVIRUS 2 (TAT 6-24 HRS): SARS Coronavirus 2: NEGATIVE

## 2020-06-30 MED ORDER — AZITHROMYCIN 250 MG PO TABS: 250.0000 mg | ORAL_TABLET | Freq: Every day | ORAL | 0 refills | Status: DC

## 2020-06-30 NOTE — ED Triage Notes (Signed)
Pt presents today with c/o of nasal congestion, runny nose, ear pain, dizziness and sore throat on/off last two weeks.

## 2020-06-30 NOTE — ED Provider Notes (Signed)
[facility]    CSN: [id number] Arrival date & time: 06/30/20  1024      History   Chief Complaint Chief Complaint  Patient presents with  . Nasal Congestion  . Dizziness  . Otalgia  . Sore Throat  . Cough    HPI Sylvia Cantrell is a 45 y.o. female.   Patient presents with 1 day history of ear pain, sore throat, shortness of breath, and dizziness.  She also reports runny nose and nasal congestion due to seasonal allergies x 2 weeks.  She denies fever, chills, chest pain, focal weakness, vomiting, diarrhea, or other symptoms.  Treatment attempted at home with allergy medications and albuterol inhaler.  Her medical history includes hypertension, asthma, seizures, anemia, kidney stones, pancreatic mass, partial pancreatectomy, splenectomy, IBS, chronic headache, obesity, anxiety.  The history is provided by the patient and medical records.    Past Medical History:  Diagnosis Date  . ADHD   . Allergy   . Anemia   . Anxiety   . Asthma   . Chronic headache   . Depression   . Hypertension   . IBS (irritable bowel syndrome)   . Kidney stones   . Nephrolithiasis   . Obesity   . Pancreatic mass   . Pneumonia   . Seizures [facility])     Patient Active Problem List   Diagnosis Date Noted  . S/P splenectomy 08/14/2019  . Essential hypertension, benign 08/14/2019  . History of partial pancreatectomy 08/14/2019  . Sepsis due to undetermined organism (HCC) 04/26/2019  . Hypophosphatemia 04/26/2019  . Hypokalemia 04/26/2019  . Pancreatic mass 04/26/2019  . Pneumonia 04/25/2019  . Elevated blood pressure reading 09/12/2018  . Mild intermittent asthma without complication 09/12/2018  . Anemia 05/02/2018  . Neck pain 06/21/2017  . Anterior pleuritic pain 06/21/2017  . Iron deficiency anemia due to chronic blood loss 02/28/2017  . Situational anxiety 11/17/2016  . Menorrhagia with regular cycle 08/11/2016  . Anemia due to chronic blood loss 08/11/2016  .  NEPHROLITHIASIS, HX OF 02/15/2010  . ALLERGIC RHINITIS 02/11/2010  . Asthma 02/11/2010    Past Surgical History:  Procedure Laterality Date  . ABLATION    . BREAST SURGERY    . DILATION AND CURETTAGE OF UTERUS    . ESOPHAGOGASTRODUODENOSCOPY (EGD) WITH PROPOFOL N/A 05/24/2019   Procedure: ESOPHAGOGASTRODUODENOSCOPY (EGD) WITH PROPOFOL;  Surgeon: Rachael Fee, MD;  Location: [facility];  Service: Endoscopy;  Laterality: N/A;  category 2   . EUS N/A 05/24/2019   Procedure: UPPER ENDOSCOPIC ULTRASOUND (EUS) RADIAL;  Surgeon: Rachael Fee, MD;  Location: [facility];  Service: Endoscopy;  Laterality: N/A;  category 2   . FINE NEEDLE ASPIRATION N/A 05/24/2019   Procedure: FINE NEEDLE ASPIRATION (FNA) LINEAR;  Surgeon: Rachael Fee, MD;  Location: [facility];  Service: Endoscopy;  Laterality: N/A;  . IR RADIOLOGIST EVAL & MGMT  08/01/2019  . PANCREATECTOMY N/A 07/04/2019   Procedure: HAND ASSISTED DISTAL PANCREATECTOMY/SPLENECTOMY;  Surgeon: Almond Lint, MD;  Location: [facility];  Service: General;  Laterality: N/A;  . TONSILLECTOMY  1987  . WISDOM TOOTH EXTRACTION      OB History   No obstetric history on file.      Home Medications    Prior to Admission medications   Medication Sig Start Date End Date Taking? Authorizing Provider  amLODipine (NORVASC) 5 MG tablet TAKE 1 TABLET(5 MG) BY MOUTH DAILY 03/10/20  Yes Sagardia, Eilleen Kempf, MD  azithromycin (ZITHROMAX) 250 MG tablet  Take 1 tablet (250 mg total) by mouth daily. Take first 2 tablets together, then 1 every day until finished. 06/30/20  Yes Mickie Bail, NP  escitalopram (LEXAPRO) 5 MG tablet TAKE 1 TABLET(5 MG) BY MOUTH DAILY 08/14/19  Yes Lezlie Lye, Meda Coffee, MD  gabapentin (NEURONTIN) 100 MG capsule Take 1 capsule (100 mg total) by mouth 2 (two) times daily. 07/06/19  Yes Almond Lint, MD  LORazepam (ATIVAN) 0.5 MG tablet Take 1/2-1 tab PO BID prn 09/12/18  Yes Corum, Minerva Fester, MD  methylphenidate 27 MG PO CR tablet  Take 27 mg by mouth daily. 06/12/20  Yes [provider]  acetaminophen (TYLENOL) 325 MG tablet Take 650 mg by mouth every 6 (six) hours as needed for moderate pain or headache.     [provider]  albuterol (VENTOLIN HFA) 108 (90 Base) MCG/ACT inhaler Inhale 2 puffs into the lungs every 6 (six) hours as needed for wheezing or shortness of breath. Patient taking differently: Inhale 2 puffs into the lungs every 4 (four) hours as needed for wheezing or shortness of breath.  04/17/19   Lezlie Lye, Meda Coffee, MD  calcium carbonate (TUMS - DOSED IN MG ELEMENTAL CALCIUM) 500 MG chewable tablet Chew 2 tablets by mouth daily as needed for indigestion or heartburn.    [provider]  cetirizine (ZYRTEC) 10 MG tablet Take 10 mg by mouth at bedtime.    [provider]  clobetasol cream (TEMOVATE) 0.05 % Apply 1 application topically 2 (two) times daily. 08/14/19   Lezlie Lye, Meda Coffee, MD  fluticasone [facility]) 50 MCG/ACT nasal spray Place 2 sprays into both nostrils daily. Patient taking differently: Place 2 sprays into both nostrils daily as needed for allergies.  10/27/17   Doristine Bosworth, MD  ibuprofen (ADVIL) 800 MG tablet Take 1 tablet (800 mg total) by mouth every 8 (eight) hours as needed. 02/11/20   Mickie Bail, NP  methocarbamol (ROBAXIN) 500 MG tablet Take 1 tablet (500 mg total) by mouth every 8 (eight) hours as needed for muscle spasms. 07/06/19   Almond Lint, MD  ondansetron (ZOFRAN-ODT) 4 MG disintegrating tablet Take 1 tablet (4 mg total) by mouth every 8 (eight) hours as needed for nausea or vomiting. 04/28/19   Rhetta Mura, MD  promethazine (PHENERGAN) 12.5 MG tablet Take 12.5-25 mg by mouth every 6 (six) hours as needed. 07/25/19   [provider]    Family History Family History  Problem Relation Age of Onset  . Hypertension Mother   . Hyperthyroidism Mother   . Heart failure Father   . Heart failure Maternal Grandmother   .  Hypertension Maternal Grandmother   . Kidney disease Maternal Grandmother   . Heart failure Paternal Grandfather     Social History Social History   Tobacco Use  . Smoking status: Former Smoker    Types: Cigarettes  . Smokeless tobacco: Never Used  . Tobacco comment: quit smoking in college  Vaping Use  . Vaping Use: Never used  Substance Use Topics  . Alcohol use: Not Currently    Alcohol/week: 0.0 standard drinks  . Drug use: No     Allergies   Amoxicillin and Codeine   Review of Systems Review of Systems  Constitutional: Negative for chills and fever.  HENT: Positive for congestion, ear pain, rhinorrhea and sore throat.   Eyes: Negative for pain and visual disturbance.  Respiratory: Positive for cough and shortness of breath.   Cardiovascular: Negative for chest  pain and palpitations.  Gastrointestinal: Negative for abdominal pain, diarrhea and vomiting.  Genitourinary: Negative for dysuria and hematuria.  Musculoskeletal: Negative for arthralgias and back pain.  Skin: Negative for color change and rash.  Neurological: Positive for dizziness. Negative for syncope, facial asymmetry, weakness and numbness.  All other systems reviewed and are negative.    Physical Exam Triage Vital Signs ED Triage Vitals  Enc Vitals Group     BP      Pulse      Resp      Temp      Temp src      SpO2      Weight      Height      Head Circumference      Peak Flow      Pain Score      Pain Loc      Pain Edu?      Excl. in GC?    No data found.  Updated Vital Signs BP (!) 132/99 (BP Location: Right Arm)   Pulse (!) 114   Temp 99.3 F (37.4 C) (Oral)   Resp 20   Ht 5\' 6"  (1.676 m)   Wt 225 lb (102.1 kg)   SpO2 98%   BMI 36.32 kg/m   Visual Acuity Right Eye Distance:   Left Eye Distance:   Bilateral Distance:    Right Eye Near:   Left Eye Near:    Bilateral Near:     Physical Exam Vitals and nursing note reviewed.  Constitutional:      General: She is  not in acute distress.    Appearance: She is well-developed.  HENT:     Head: Normocephalic and atraumatic.     Right Ear: Tympanic membrane is not erythematous.     Left Ear: Tympanic membrane is erythematous.     Nose: Congestion present.     Mouth/Throat:     Mouth: Mucous membranes are moist.     Pharynx: Oropharynx is clear.  Eyes:     Conjunctiva/sclera: Conjunctivae normal.  Cardiovascular:     Rate and Rhythm: Normal rate and regular rhythm.     Heart sounds: Normal heart sounds.  Pulmonary:     Effort: Pulmonary effort is normal. No respiratory distress.     Breath sounds: Normal breath sounds.  Abdominal:     Palpations: Abdomen is soft.     Tenderness: There is no abdominal tenderness.  Musculoskeletal:     Cervical back: Neck supple.  Skin:    General: Skin is warm and dry.  Neurological:     General: No focal deficit present.     Mental Status: She is alert and oriented to person, place, and time.     Sensory: No sensory deficit.     Motor: No weakness.     Coordination: Romberg sign negative.     Gait: Gait normal.  Psychiatric:        Mood and Affect: Mood normal.        Behavior: Behavior normal.      UC Treatments / Results  Labs (all labs ordered are listed, but only abnormal results are displayed) Labs Reviewed  SARS CORONAVIRUS 2 (TAT 6-24 HRS)    EKG   Radiology No results found.  Procedures Procedures (including critical care time)  Medications Ordered in UC Medications - No data to display  Initial Impression / Assessment and Plan / UC Course  I have reviewed the triage vital signs and the  nursing notes.  Pertinent labs & imaging results that were available during my care of the patient were reviewed by me and considered in my medical decision making (see chart for details).   Left otitis media.  Dizziness.  Upper respiratory infection.  Elevated blood pressure reading.  Treating otitis media with Zithromax.  Tylenol or ibuprofen  as needed for discomfort.  Instructed patient to follow-up with her PCP if her symptoms are not improving.  PCR COVID pending.  Instructed her to self quarantine until the test result is back.  ED precautions discussed.  Also discussed that her blood pressure is elevated today and needs to be rechecked by her PCP in 2 to 4 weeks.  She agrees to plan of care.   Final Clinical Impressions(s) / UC Diagnoses   Final diagnoses:  Left otitis media, unspecified otitis media type  Dizziness  Upper respiratory tract infection, unspecified type  Elevated blood pressure reading     Discharge Instructions     Take the Zithromax as directed.  Follow up with your primary care provider if your symptoms are not improving.    Go to the Emergency Department if you have acute worsening symptoms.    Your blood pressure is elevated today at 132/99.  Please have this rechecked by your primary care provider in 2-4 weeks.           ED Prescriptions    Medication Sig Dispense Auth. Provider   azithromycin (ZITHROMAX) 250 MG tablet Take 1 tablet (250 mg total) by mouth daily. Take first 2 tablets together, then 1 every day until finished. 6 tablet Mickie Bailate, Aniqua Briere H, NP     PDMP not reviewed this encounter.   Mickie Bailate, Baillie Mohammad H, NP 06/30/20 1124

## 2020-06-30 NOTE — Discharge Instructions (Addendum)
Take the Zithromax as directed.  Follow up with your primary care provider if your symptoms are not improving.    Go to the Emergency Department if you have acute worsening symptoms.    Your blood pressure is elevated today at 132/99.  Please have this rechecked by your primary care provider in 2-4 weeks.

## 2020-08-28 DIAGNOSIS — F322 Major depressive disorder, single episode, severe without psychotic features: Secondary | ICD-10-CM | POA: Diagnosis not present

## 2020-08-28 DIAGNOSIS — F411 Generalized anxiety disorder: Secondary | ICD-10-CM | POA: Diagnosis not present

## 2020-08-29 DIAGNOSIS — D136 Benign neoplasm of pancreas: Secondary | ICD-10-CM | POA: Diagnosis not present

## 2020-08-29 DIAGNOSIS — K8681 Exocrine pancreatic insufficiency: Secondary | ICD-10-CM | POA: Diagnosis not present

## 2020-08-29 DIAGNOSIS — R1012 Left upper quadrant pain: Secondary | ICD-10-CM | POA: Diagnosis not present

## 2020-09-09 ENCOUNTER — Other Ambulatory Visit: Payer: Self-pay | Admitting: General Surgery

## 2020-09-09 DIAGNOSIS — R1012 Left upper quadrant pain: Secondary | ICD-10-CM

## 2020-09-25 IMAGING — MR MR ABDOMEN WO/W CM
11 of 20 series · 19 of 48 positions shown · IV contrast (10 GAD)
Comparison: CT scan 04/25/2019

CLINICAL DATA: Evaluate pancreatic lesions seen on recent CT scan.

EXAM:
MRI ABDOMEN WITHOUT AND WITH CONTRAST
TECHNIQUE: Multiplanar multisequence MR imaging of the abdomen was performed
both before and after the administration of intravenous contrast.
CONTRAST:  9mL GADAVIST GADOBUTROL 1 MMOL/ML IV SOLN

[Series 3: cor ssfse nav · coronal · 6.0mm · 0.78mm/px · 1 of 42 slices shown]
[im 1/42]
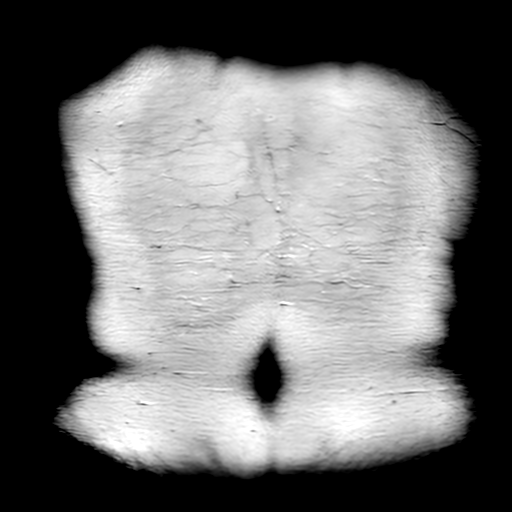

[Series 4: ax ssfse nav · axial · 6.0mm · 0.82mm/px · 1 of 46 slices shown]
[im 1/46]
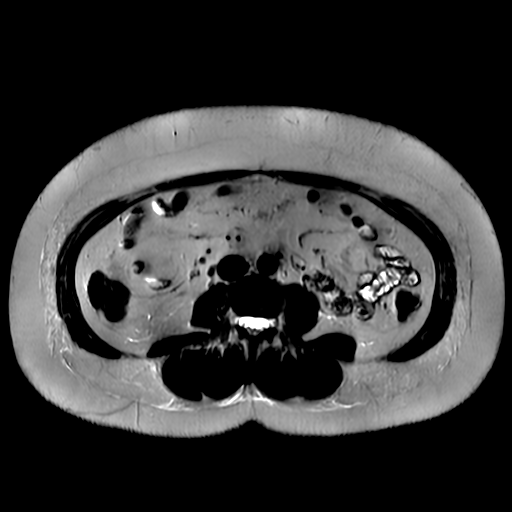

[Series 5: T2 fat-sat · axial · 6.0mm · 0.82mm/px · 1 of 46 slices shown]
[im 1/46]
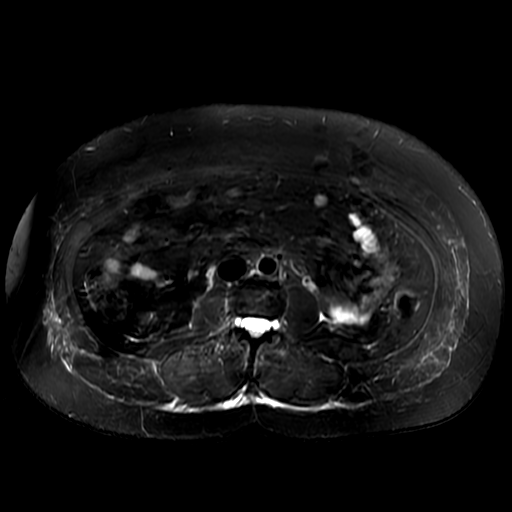

[Series 7: DWI b500 · axial · 6.0mm · 1.64mm/px · 1 of 92 slices shown]
[im 1/92]
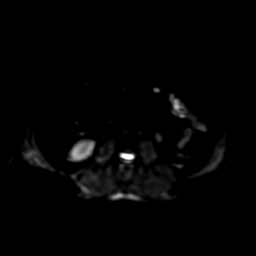

[Series 750: ADC · axial · 6.0mm · 1.64mm/px · 1 of 46 slices shown]
[im 1/46]
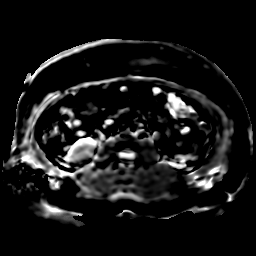

[Series 851: projection images · axial · 1.2mm · 0.60mm/px · 1 of 3 slices shown]
[im 1/3]
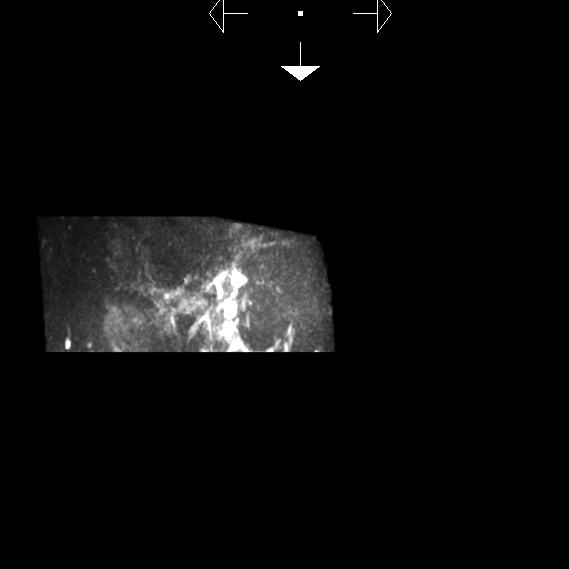

[Series 1100: T1 dynamic post-contrast · axial · non-contrast · 5.0mm · 0.82mm/px · z∈[+26,+323]mm · 3 of 120 slices shown (1 of 5)]
[im 1/120]
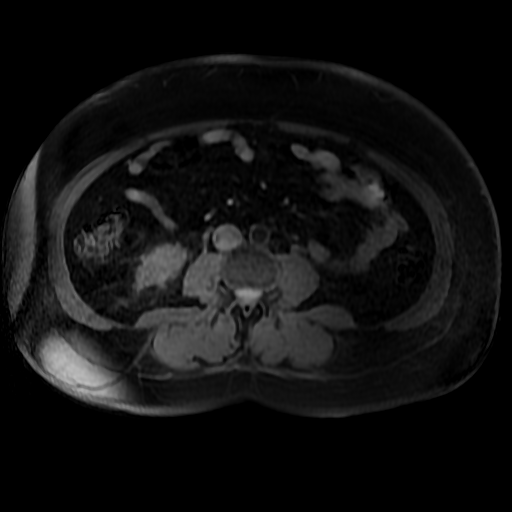
[im 60/120]
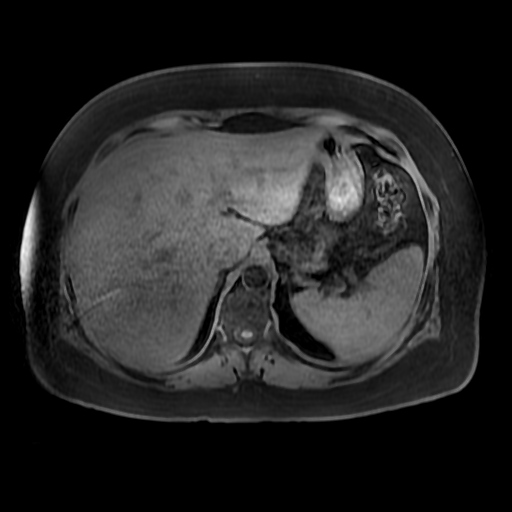
[im 120/120]
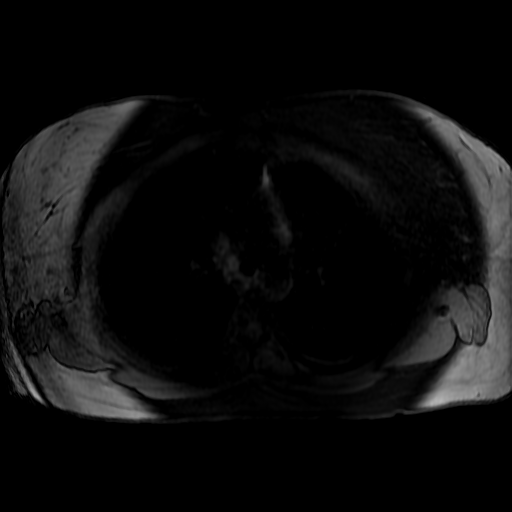

[Series 1101: T1 dynamic post-contrast · axial · non-contrast · 5.0mm · 0.82mm/px · z∈[+26,+323]mm · 3 of 120 slices shown (2 of 5)]
[im 1/120]
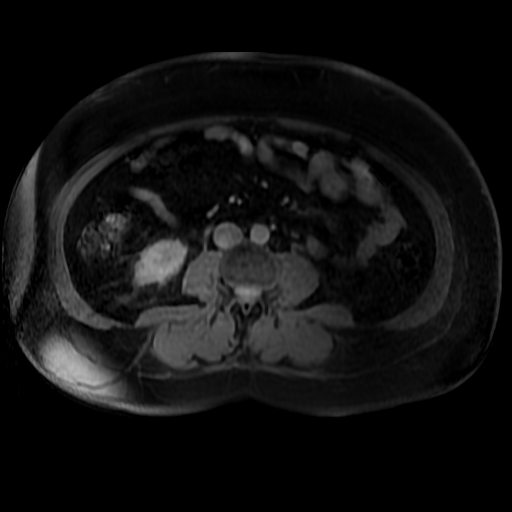
[im 60/120]
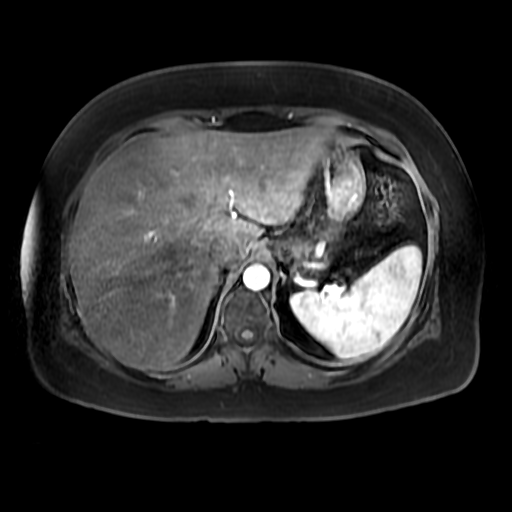
[im 120/120]
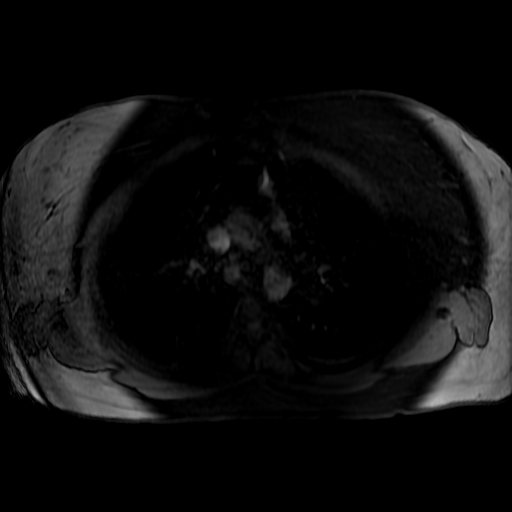

[Series 1102: T1 dynamic post-contrast · axial · non-contrast · 5.0mm · 0.82mm/px · z∈[+26,+323]mm · 3 of 120 slices shown (3 of 5)]
[im 1/120]
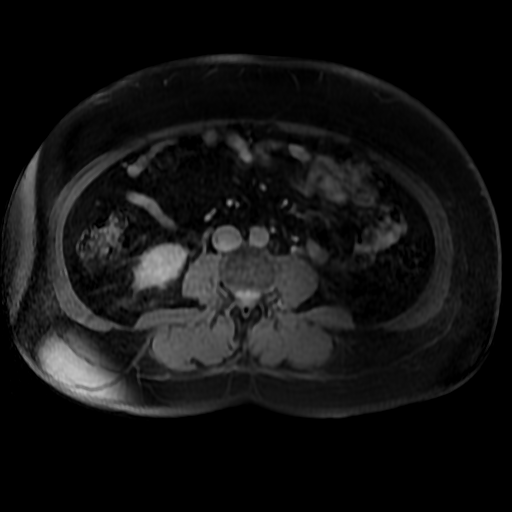
[im 60/120]
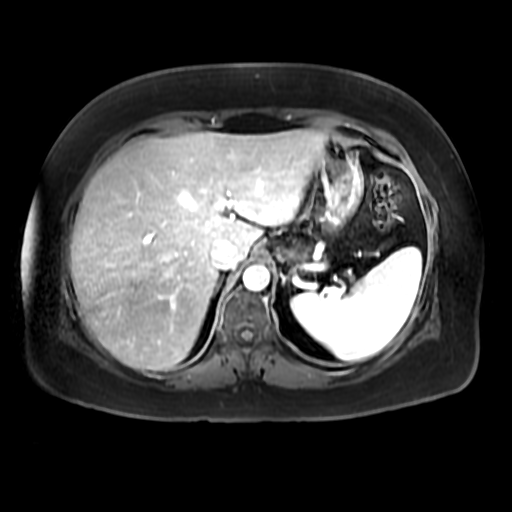
[im 120/120]
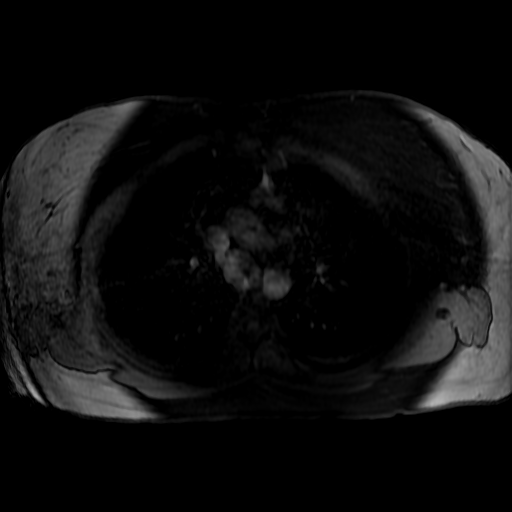

[Series 1103: T1 dynamic post-contrast · axial · non-contrast · 5.0mm · 0.82mm/px · z∈[+26,+323]mm · 3 of 120 slices shown (4 of 5)]
[im 1/120]
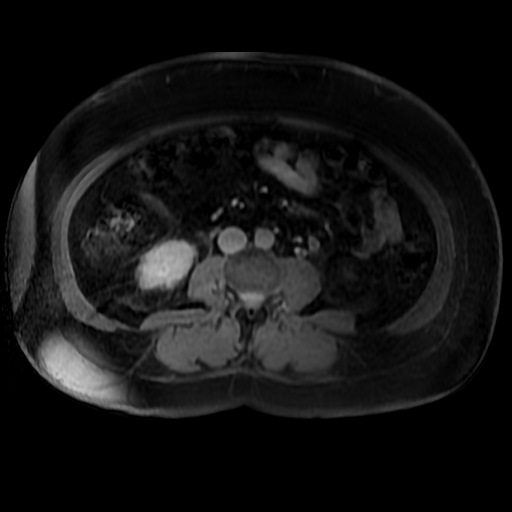
[im 60/120]
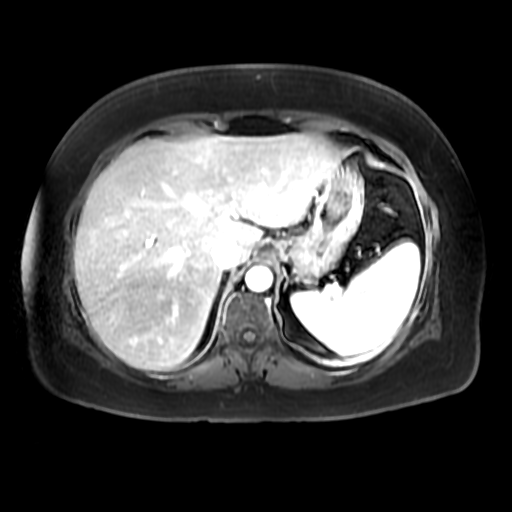
[im 120/120]
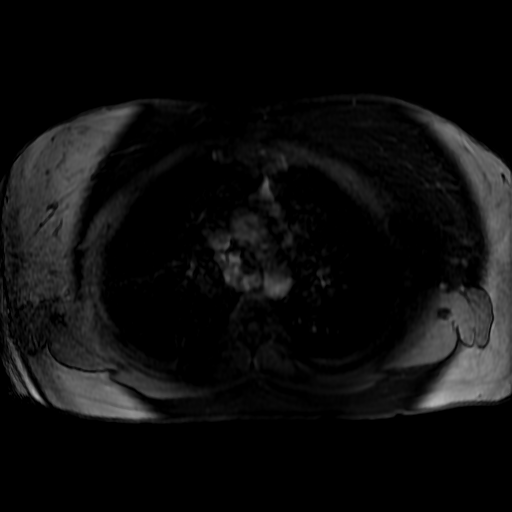

[Series 1104: T1 dynamic post-contrast · axial · non-contrast · 5.0mm · 0.82mm/px · 1 of 120 slices shown (5 of 5)]
[im 1/120]
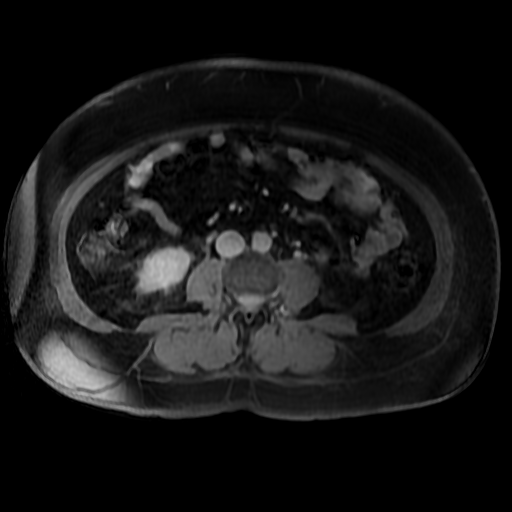

[19 of 48 positions shown; findings below may reference images not displayed]

FINDINGS: Lower chest: The lung bases are grossly clear. No pulmonary lesions,
pleural or pericardial effusion.

Hepatobiliary: No focal hepatic lesions or intrahepatic biliary
dilatation. The gallbladder appears normal. The portal and hepatic
veins are patent. No common bile duct dilatation.

Pancreas: As demonstrated on the CT scan there is a 3.1 x 2.7 cm
lesion in the pancreatic tail. This appears fairly well encapsulated
and appears largely cystic on the T2 weighted images with septations
and faint nodularity. The lesion demonstrates subsequent internal
contrast enhancement. Given the patient's age and this location AP
mucinous cystic neoplasm or mucinous cystadenoma would be a strong
consideration. A solid pseudopapillary tumor is also a
consideration.

There is a 6 mm exophytic cyst projecting off the pancreatic head
(image [DATE]. No contrast enhancement is demonstrated.

7 mm cystic lesion noted in the pancreatic head inferiorly on image
33/4. No contrast enhancement is demonstrated. These are both likely
benign postinflammatory cysts.

Spleen:  Within normal limits in size.  No splenic lesions.

Adrenals/Urinary Tract: The adrenal glands and kidneys are
unremarkable.

Stomach/Bowel: Visualized portions within the abdomen are
unremarkable.

Vascular/Lymphatic: The aorta and branch vessels are normal. The
major venous structures are patent. No mesenteric or retroperitoneal
mass or adenopathy.

Other:  No ascites or abdominal wall hernia.

Musculoskeletal: No significant bony findings.
IMPRESSION: 1. 3.1 x 2.7 cm cystic appearing lesion in the pancreatic tail, most
likely a mucinous cystadenoma or mucinous cystic neoplasm. Solid
pseudopapillary tumor would also be a consideration in this age.
Recommend endoscopic biopsy (if possible in this location).
2. Two small (sub 8 mm) cystic lesions in the pancreatic head
without worrisome MR imaging features. These will require
surveillance.
3. No findings for metastatic disease or adenopathy.

## 2020-10-01 ENCOUNTER — Encounter: Payer: Self-pay | Admitting: Hematology and Oncology

## 2020-10-07 IMAGING — DX DG CHEST 2V
2 series · 3 of 3 positions shown · non-contrast
Comparison: 04/25/2019

CLINICAL DATA: Shortness of breath and fatigue

EXAM:
CHEST - 2 VIEW

[Series 3: chest pa · 0.14mm/px · 2 of 2 slices shown]
[im 1/2]
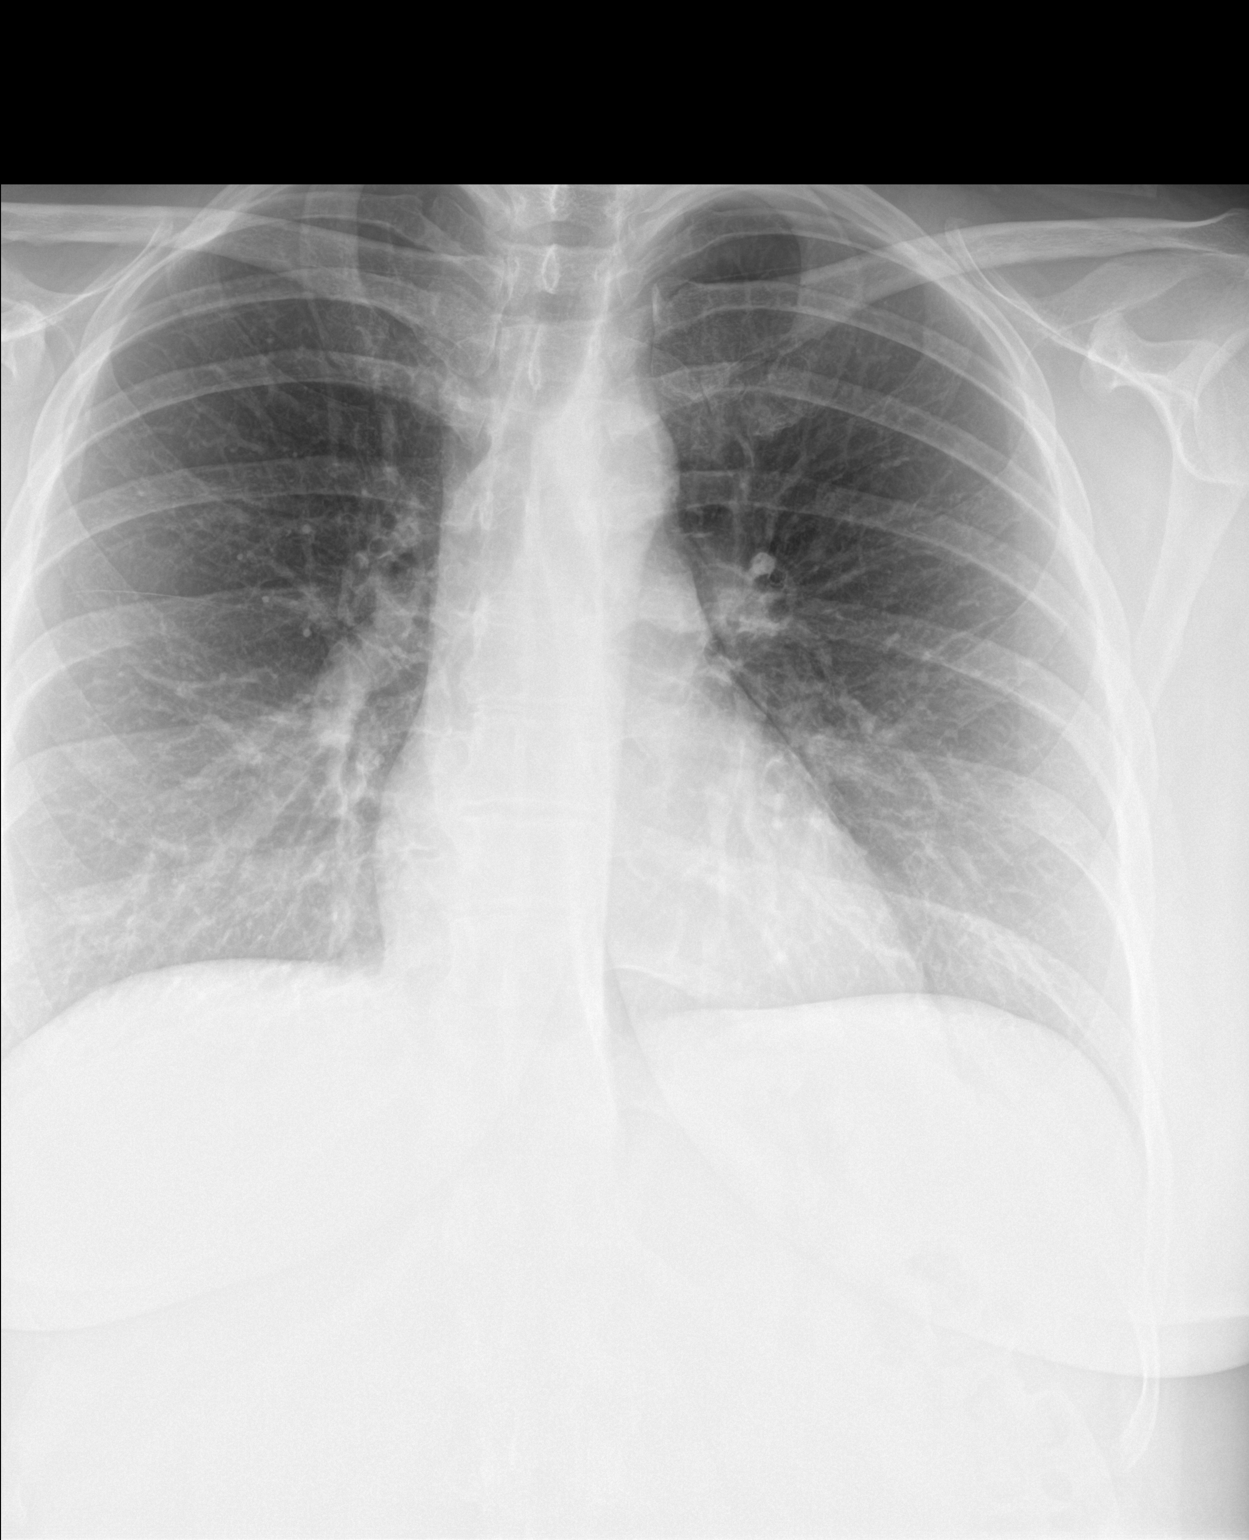
[im 2/2]
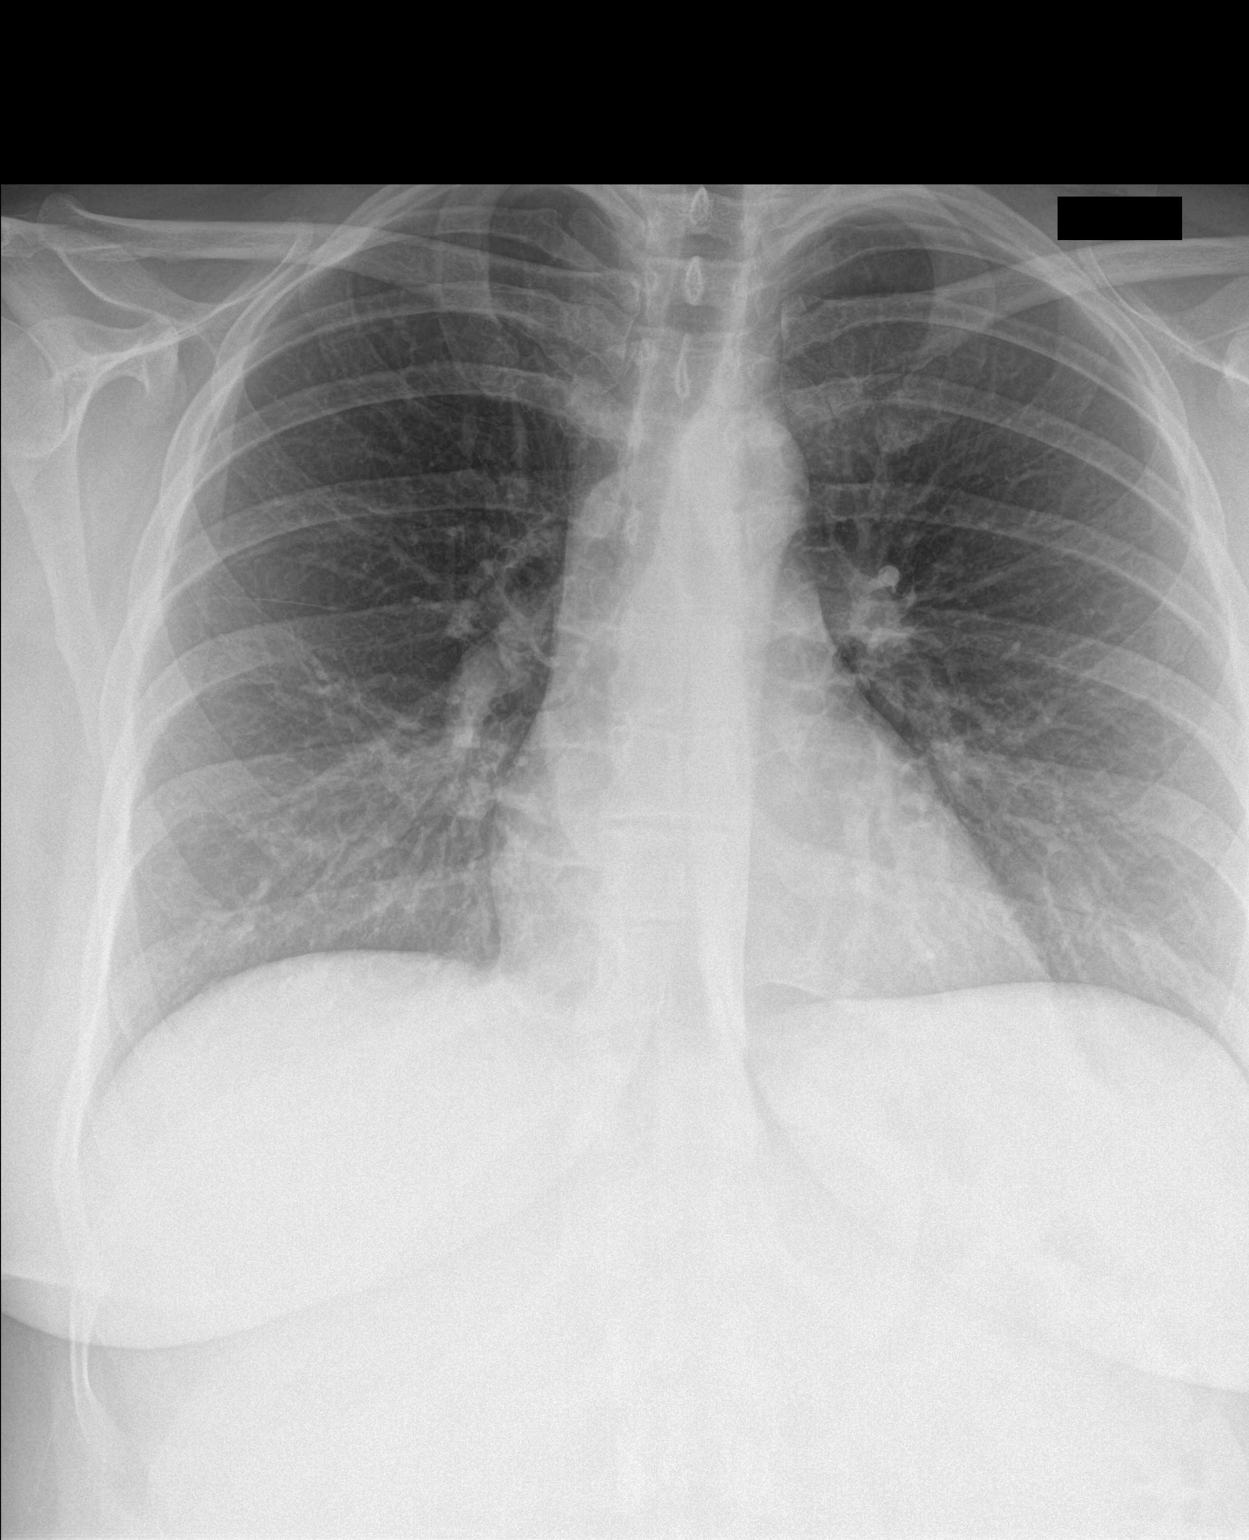

[chest lat]
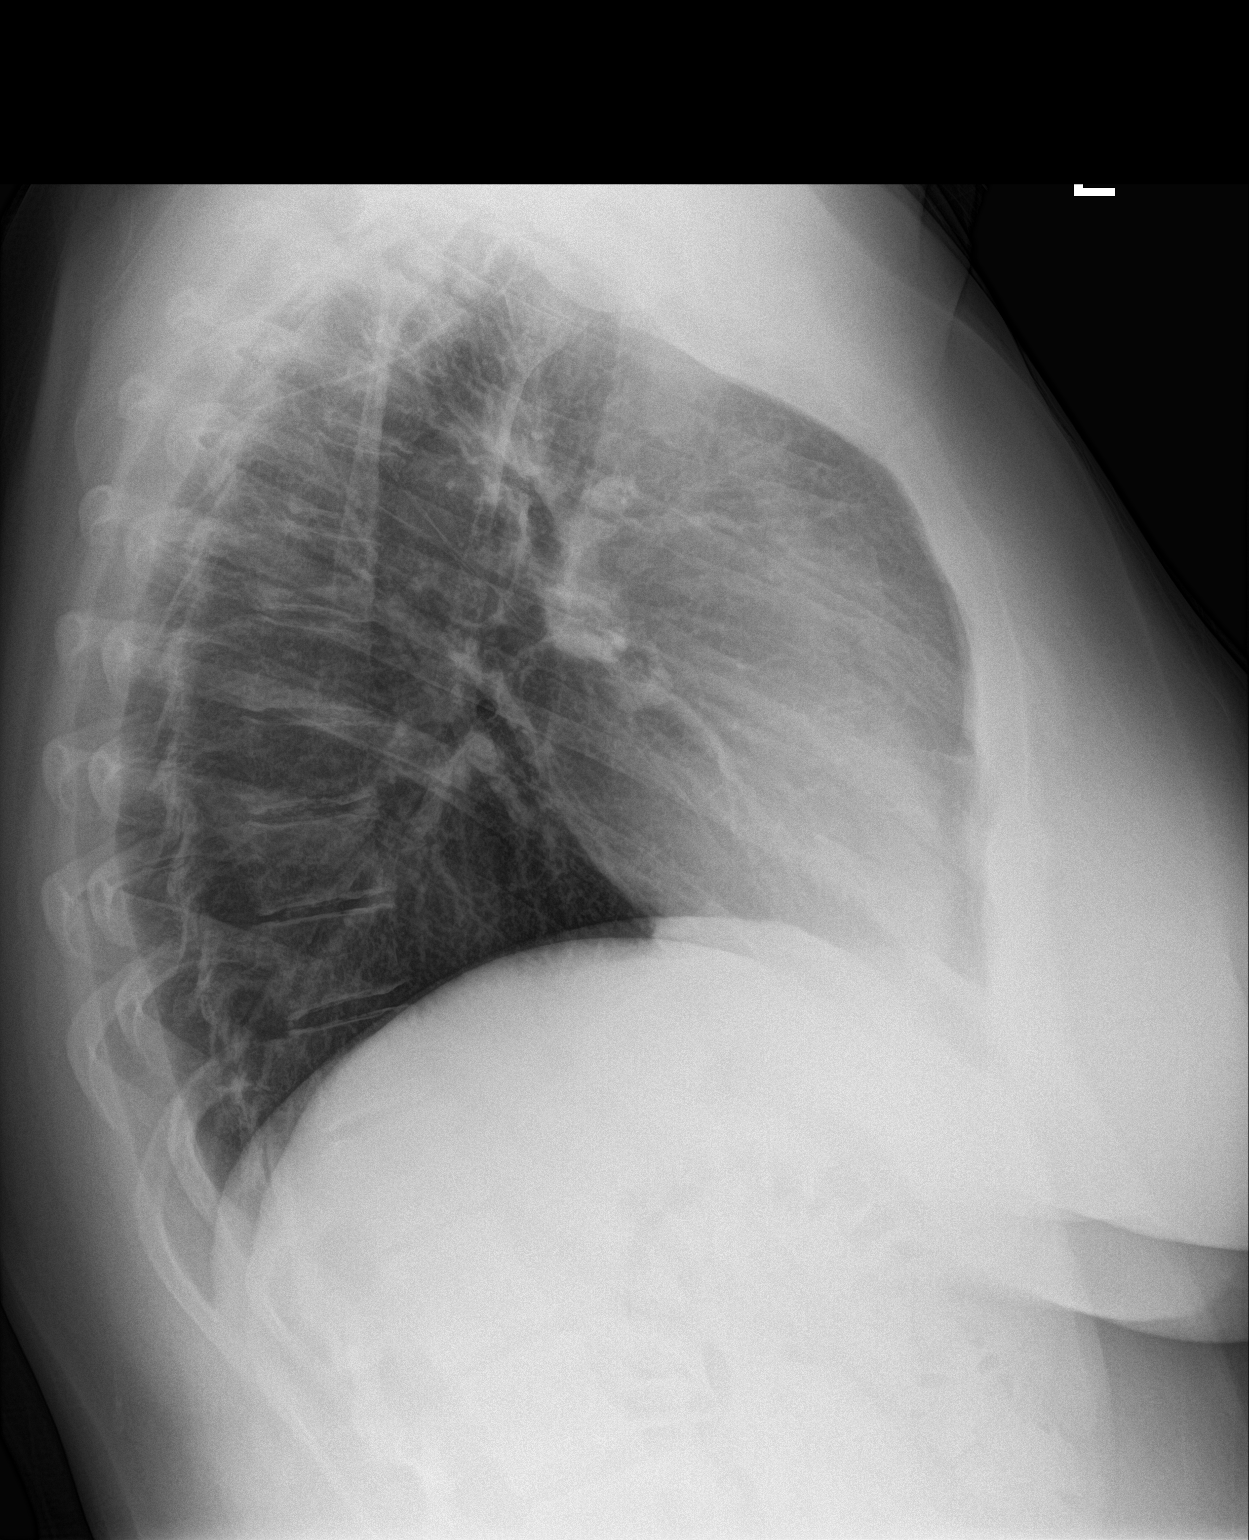

[3 of 3 positions shown; findings below may reference images not displayed]

FINDINGS: The heart size and mediastinal contours are within normal limits.
Both lungs are clear. The visualized skeletal structures are
unremarkable.
IMPRESSION: No active cardiopulmonary disease.

## 2020-10-08 ENCOUNTER — Other Ambulatory Visit: Payer: BC Managed Care – PPO

## 2021-02-26 ENCOUNTER — Encounter: Payer: Self-pay | Admitting: Hematology and Oncology

## 2021-03-02 ENCOUNTER — Ambulatory Visit (INDEPENDENT_AMBULATORY_CARE_PROVIDER_SITE_OTHER): Payer: 59 | Admitting: Dermatology

## 2021-03-02 ENCOUNTER — Other Ambulatory Visit: Payer: Self-pay

## 2021-03-02 DIAGNOSIS — L853 Xerosis cutis: Secondary | ICD-10-CM | POA: Diagnosis not present

## 2021-03-02 DIAGNOSIS — L209 Atopic dermatitis, unspecified: Secondary | ICD-10-CM

## 2021-03-02 DIAGNOSIS — L301 Dyshidrosis [pompholyx]: Secondary | ICD-10-CM | POA: Diagnosis not present

## 2021-03-02 DIAGNOSIS — L280 Lichen simplex chronicus: Secondary | ICD-10-CM

## 2021-03-02 MED ORDER — BETAMETHASONE DIPROPIONATE 0.05 % EX CREA: TOPICAL_CREAM | CUTANEOUS | 2 refills | Status: AC

## 2021-03-02 MED ORDER — TACROLIMUS 0.1 % EX OINT: TOPICAL_OINTMENT | CUTANEOUS | 2 refills | Status: AC

## 2021-03-02 NOTE — Patient Instructions (Addendum)
Recommend OTC Gold Bond Rapid Relief Anti-Itch cream (pramoxine + menthol), CeraVe Anti-itch cream or lotion (pramoxine), Sarna lotion (Original- menthol + camphor or Sensitive- pramoxine) or Eucerin 12 hour Itch Relief lotion (menthol) up to 3 times per day to areas on body that are itchy.   Topical steroids (such as triamcinolone, fluocinolone, fluocinonide, mometasone, clobetasol, halobetasol, betamethasone, hydrocortisone) can cause thinning and lightening of the skin if they are used for too long in the same area. Your physician has selected the right strength medicine for your problem and area affected on the body. Please use your medication only as directed by your physician to prevent side effects.    Dry Skin Care  What causes dry skin?  Dry skin is common and results from inadequate moisture in the outer skin layers. Dry skin usually results from the excessive loss of moisture from the skin surface. This occurs due to two major factors: Normally the skin's oil glands deposit a layer of oil on the skin's surface. This layer of oil prevents the loss of moisture from the skin. Exposure to soaps, cleaners, solvents, and disinfectants removes this oily film, allowing water to escape. Water loss from the skin increases when the humidity is low. During winter months we spend a lot of time indoors where the air is heated. Heated air has very low humidity. This also contributes to dry skin.  A tendency for dry skin may accompany such disorders as eczema. Also, as people age, the number of functioning oil glands decreases, and the tendency toward dry skin can be a sensation of skin tightness when emerging from the shower.  How do I manage dry skin?  Humidify your environment. This can be accomplished by using a humidifier in your bedroom at night during winter months. Bathing can actually put moisture back into your skin if done right. Take the following steps while bathing to sooth dry  skin: Avoid hot water, which only dries the skin and makes itching worse. Use warm water. Avoid washcloths or extensive rubbing or scrubbing. Use mild soaps like unscented Dove, Oil of Olay, Cetaphil, Basis, or CeraVe. If you take baths rather than showers, rinse off soap residue with clean water before getting out of tub. Once out of the shower/tub, pat dry gently with a soft towel. Leave your skin damp. While still damp, apply any medicated ointment/cream you were prescribed to the affected areas. After you apply your medicated ointment/cream, then apply your moisturizer to your whole body.This is the most important step in dry skin care. If this is omitted, your skin will continue to be dry. The choice of moisturizer is also very important. In general, lotion will not provider enough moisture to severely dry skin because it is water based. You should use an ointment or cream. Moisturizers should also be unscented. Good choices include Vaseline (plain petrolatum), Aquaphor, Cetaphil, CeraVe, Vanicream, DML Forte, Aveeno moisture, or Eucerin Cream. Bath oils can be helpful, but do not replace the application of moisturizer after the bath. In addition, they make the tub slippery causing an increased risk for falls. Therefore, we do not recommend their use.  If You Need Anything After Your Visit  If you have any questions or concerns for your doctor, please call our main line at [phone] and press option 4 to reach your doctor's medical assistant. If no one answers, please leave a voicemail as directed and we will return your call as soon as possible. Messages left after 4 pm will be answered  the following business day.   You may also send [place] a message via MyChart. We typically respond to MyChart messages within 1-2 business days.  For prescription refills, please ask your pharmacy to contact our office. Our fax number is [phone].  If you have an urgent issue when the clinic is closed that  cannot wait until the next business day, you can page your doctor at the number below.    Please note that while we do our best to be available for urgent issues outside of office hours, we are not available 24/7.   If you have an urgent issue and are unable to reach [place], you may choose to seek medical care at your doctor's office, retail clinic, urgent care center, or emergency room.  If you have a medical emergency, please immediately call 911 or go to the emergency department.  Pager Numbers  - Dr. Gwen Pounds: [phone]  - Dr. Neale Burly: [phone]  - Dr. Roseanne Reno: [phone]  In the event of inclement weather, please call our main line at [phone] for an update on the status of any delays or closures.  Dermatology Medication Tips: Please keep the boxes that topical medications come in in order to help keep track of the instructions about where and how to use these. Pharmacies typically print the medication instructions only on the boxes and not directly on the medication tubes.   If your medication is too expensive, please contact our office at [phone] option 4 or send [place] a message through MyChart.   We are unable to tell what your co-pay for medications will be in advance as this is different depending on your insurance coverage. However, we may be able to find a substitute medication at lower cost or fill out paperwork to get insurance to cover a needed medication.   If a prior authorization is required to get your medication covered by your insurance company, please allow [place] 1-2 business days to complete this process.  Drug prices often vary depending on where the prescription is filled and some pharmacies may offer cheaper prices.  The website www.goodrx.com contains coupons for medications through different pharmacies. The prices here do not account for what the cost may be with help from insurance (it may be cheaper with your insurance), but the website can give you the  price if you did not use any insurance.  - You can print the associated coupon and take it with your prescription to the pharmacy.  - You may also stop by our office during regular business hours and pick up a GoodRx coupon card.  - If you need your prescription sent electronically to a different pharmacy, notify our office through [facility] or by phone at [phone] option 4.     Si Usted Necesita Algo Despus de Su Visita  Tambin puede enviarnos un mensaje a travs de Clinical cytogeneticist. Por lo general respondemos a los mensajes de MyChart en el transcurso de 1 a 2 das hbiles.  Para renovar recetas, por favor pida a su farmacia que se ponga en contacto con nuestra oficina. Annie Sable de fax es [place] [phone].  Si tiene un asunto urgente cuando la clnica est cerrada y que no puede esperar hasta el siguiente da hbil, puede llamar/localizar a su doctor(a) al nmero que aparece a continuacin.   Por favor, tenga en cuenta que aunque hacemos todo lo posible para estar disponibles para asuntos urgentes fuera del horario de [place], no estamos disponibles las 24 horas del da, los  7 das de [employer].   Si tiene un problema urgente y no puede comunicarse con nosotros, puede optar por buscar atencin mdica  en el consultorio de su doctor(a), en una clnica privada, en un centro de atencin urgente o en una sala de emergencias.  Si tiene Engineer, drilling, por favor llame inmediatamente al 911 o vaya a la sala de emergencias.  Nmeros de bper  - Dr. Gwen Pounds: [phone]  - Dra. Moye: [phone]  - Dra. Roseanne Reno: [phone]  En caso de inclemencias del [place], por favor llame a Lacy Duverney principal al [phone] para una actualizacin sobre el [place] de cualquier retraso o cierre.  Consejos para la medicacin en dermatologa: Por favor, guarde las cajas en las que vienen los medicamentos de uso tpico para ayudarle a seguir las instrucciones sobre dnde y cmo  usarlos. Las farmacias generalmente imprimen las instrucciones del medicamento slo en las cajas y no directamente en los tubos del [place].   Si su medicamento es muy caro, por favor, pngase en contacto con Rolm Gala llamando al [phone] y presione la opcin 4 o envenos un mensaje a travs de Clinical cytogeneticist.   No podemos decirle cul ser su copago por los medicamentos por adelantado ya que esto es diferente dependiendo de la cobertura de su seguro. Sin embargo, es posible que podamos encontrar un medicamento sustituto a Audiological scientist un formulario para que el seguro cubra el medicamento que se considera necesario.   Si se requiere una autorizacin previa para que su compaa de seguros [place] su medicamento, por favor permtanos de 1 a 2 das hbiles para completar [address]  Los precios de los medicamentos varan con frecuencia dependiendo del Environmental consultant de dnde se surte la receta y alguna farmacias pueden ofrecer precios ms baratos.  El sitio web www.goodrx.com tiene cupones para medicamentos de Health and safety inspector. Los precios aqu no tienen en cuenta lo que podra costar con la ayuda del seguro (puede ser ms barato con su seguro), pero el sitio web puede darle el precio si no utiliz Tourist information centre manager.  - Puede imprimir el cupn correspondiente y llevarlo con su receta a la farmacia.  - Tambin puede pasar por nuestra oficina durante el horario de atencin regular y Education officer, museum una tarjeta de cupones de GoodRx.  - Si necesita que su receta se enve electrnicamente a una farmacia diferente, informe a nuestra oficina a travs de MyChart de [place] o por telfono llamando al [phone] y presione la opcin 4.

## 2021-03-02 NOTE — Progress Notes (Signed)
New Patient Visit  Subjective  Sylvia Cantrell is a 45 y.o. female who presents for the following: Rash (Right pretibia. Patient had a fall in Feb 2020, had bruising and hematoma of the right pretibia. Area has never fully healed and is very itchy. She tries to keep area moisturized with Vaseline. She has not used any prescription medicine on this area. She has a history of eczema of the hands and elbows. ) and Itchy spots (Right upper thigh.). Patient has seasonal allergies and asthma. Has tried oral benadryl which doesn't stop the itching.   The following portions of the chart were reviewed this encounter and updated as appropriate:       Review of Systems:  No other skin or systemic complaints except as noted in HPI or Assessment and Plan.  Objective  Well appearing patient in no apparent distress; mood and affect are within normal limits.  A focused examination was performed including right leg, hands. Relevant physical exam findings are noted in the Assessment and Plan.  hands Scattered small pink papules and vesicles on the fingers.  right pretibia, bil elbows, right ant and post thigh Erythematous scaly plaque with papules with lichenification of the right pretibia and bilateral elbows; pink scaly patch of the right ant and post thigh.   Assessment & Plan  Xerosis - diffuse xerotic patches - recommend gentle, hydrating skin care - gentle skin care handout given  Dyshidrotic hand dermatitis hands  Hand Dermatitis is a chronic type of eczema that can come and go on the hands and fingers.  While there is no cure, the rash and symptoms can be managed with topical prescription medications, and for more severe cases, with systemic medications.  Recommend mild soap and routine use of moisturizing cream after handwashing.  Minimize soap/water exposure when possible.    Start betamethasone dipropionate cream Spot treat AA qd/bid. Avoid face, groin, axilla.  Start tacrolimus  ointment qd/bid AA.  Atopic dermatitis, unspecified type right pretibia, bil elbows, right ant and post thigh  With LSC   Atopic dermatitis (eczema) is a chronic, relapsing, pruritic condition that can significantly affect quality of life. It is often associated with allergic rhinitis and/or asthma and can require treatment with topical medications, phototherapy, or in severe cases biologic injectable medication (Dupixent; Adbry) or Oral JAK inhibitors.  Start betamethasone dipropionate cream apply to AA rash on leg, elbows BID until improved. May spot treat AA hands. Dsp 45g 2Rf. Avoid face, groin, axilla.   Start tacrolimus ointment Apply to AA rash BID until improved dsp 100g 2Rf.  Discussed Dupixent injections. If not improving with topicals, plan to start authorization for Dupixent.   Aveeno Balm and Eczema cream samples given.  Recommend OTC Gold Bond Rapid Relief Anti-Itch cream (pramoxine + menthol), CeraVe Anti-itch cream or lotion (pramoxine), Sarna lotion (Original- menthol + camphor or Sensitive- pramoxine) or Eucerin 12 hour Itch Relief lotion (menthol) up to 3 times per day to areas on body that are itchy.  Topical steroids (such as triamcinolone, fluocinolone, fluocinonide, mometasone, clobetasol, halobetasol, betamethasone, hydrocortisone) can cause thinning and lightening of the skin if they are used for too long in the same area. Your physician has selected the right strength medicine for your problem and area affected on the body. Please use your medication only as directed by your physician to prevent side effects.    tacrolimus (PROTOPIC) 0.1 % ointment - right pretibia, bil elbows, right ant and post thigh Apply to affected areas rash 1-2 times  a day until improved.  betamethasone dipropionate 0.05 % cream - right pretibia, bil elbows, right ant and post thigh Apply once to twice daily to more severely affected areas of rash on legs, elbows, spot treat on hands until  improved. Avoid face, groin, underarms.  Return in about 6 weeks (around 04/13/2021) for Atopic Dermatitis.  ICherlyn Labella, CMA, am acting as scribe for Willeen Niece, MD . Documentation: I have reviewed the above documentation for accuracy and completeness, and I agree with the above.  Willeen Niece MD

## 2021-04-14 ENCOUNTER — Encounter: Payer: Self-pay | Admitting: Dermatology

## 2021-04-14 ENCOUNTER — Other Ambulatory Visit: Payer: Self-pay

## 2021-04-14 ENCOUNTER — Ambulatory Visit (INDEPENDENT_AMBULATORY_CARE_PROVIDER_SITE_OTHER): Payer: 59 | Admitting: Dermatology

## 2021-04-14 DIAGNOSIS — Z79899 Other long term (current) drug therapy: Secondary | ICD-10-CM

## 2021-04-14 DIAGNOSIS — L209 Atopic dermatitis, unspecified: Secondary | ICD-10-CM | POA: Diagnosis not present

## 2021-04-14 MED ORDER — DUPIXENT 300 MG/2ML ~~LOC~~ SOAJ: 300.0000 mg | SUBCUTANEOUS | 2 refills | Status: AC

## 2021-04-14 MED ORDER — DUPIXENT 300 MG/2ML ~~LOC~~ SOAJ: 600.0000 mg | Freq: Once | SUBCUTANEOUS | 0 refills | Status: AC

## 2021-04-14 NOTE — Patient Instructions (Addendum)
Can use natural tears (otc) for any eye irritation  Dupilumab (Dupixent) is a treatment given by injection for adults and children with moderate-to-severe atopic dermatitis. Goal is control of skin condition, not cure. It is given as 2 injections at the first dose followed by 1 injection ever 2 weeks thereafter.  Young children are dosed monthly.  Potential side effects include allergic reaction, herpes infections, injection site reactions and conjunctivitis (inflammation of the eyes).  The use of Dupixent requires long term medication management, including periodic office visits.   If You Need Anything After Your Visit  If you have any questions or concerns for your doctor, please call our main line at [phone] and press option 4 to reach your doctor's medical assistant. If no one answers, please leave a voicemail as directed and we will return your call as soon as possible. Messages left after 4 pm will be answered the following business day.   You may also send [place] a message via MyChart. We typically respond to MyChart messages within 1-2 business days.  For prescription refills, please ask your pharmacy to contact our office. Our fax number is [phone].  If you have an urgent issue when the clinic is closed that cannot wait until the next business day, you can page your doctor at the number below.    Please note that while we do our best to be available for urgent issues outside of office hours, we are not available 24/7.   If you have an urgent issue and are unable to reach [place], you may choose to seek medical care at your doctor's office, retail clinic, urgent care center, or emergency room.  If you have a medical emergency, please immediately call 911 or go to the emergency department.  Pager Numbers  - Dr. Gwen Pounds: [phone]  - Dr. Neale Burly: [phone]  - Dr. Roseanne Reno: [phone]  In the event of inclement weather, please call our main line at [phone] for an update  on the status of any delays or closures.  Dermatology Medication Tips: Please keep the boxes that topical medications come in in order to help keep track of the instructions about where and how to use these. Pharmacies typically print the medication instructions only on the boxes and not directly on the medication tubes.   If your medication is too expensive, please contact our office at [phone] option 4 or send [place] a message through MyChart.   We are unable to tell what your co-pay for medications will be in advance as this is different depending on your insurance coverage. However, we may be able to find a substitute medication at lower cost or fill out paperwork to get insurance to cover a needed medication.   If a prior authorization is required to get your medication covered by your insurance company, please allow [place] 1-2 business days to complete this process.  Drug prices often vary depending on where the prescription is filled and some pharmacies may offer cheaper prices.  The website www.goodrx.com contains coupons for medications through different pharmacies. The prices here do not account for what the cost may be with help from insurance (it may be cheaper with your insurance), but the website can give you the price if you did not use any insurance.  - You can print the associated coupon and take it with your prescription to the pharmacy.  - You may also stop by our office during regular business hours and pick up a GoodRx coupon card.  - If  you need your prescription sent electronically to a different pharmacy, notify our office through [facility] or by phone at [phone] option 4.     Si Usted Necesita Algo Despus de Su Visita  Tambin puede enviarnos un mensaje a travs de Clinical cytogeneticist. Por lo general respondemos a los mensajes de MyChart en el transcurso de 1 a 2 das hbiles.  Para renovar recetas, por favor pida a su farmacia que se ponga en contacto con nuestra  oficina. Annie Sable de fax es [place] [phone].  Si tiene un asunto urgente cuando la clnica est cerrada y que no puede esperar hasta el siguiente da hbil, puede llamar/localizar a su doctor(a) al nmero que aparece a continuacin.   Por favor, tenga en cuenta que aunque hacemos todo lo posible para estar disponibles para asuntos urgentes fuera del horario de [place], no estamos disponibles las 24 horas del da, los 7 [address]  [address] de la [place].   Si tiene un problema urgente y no puede comunicarse con nosotros, puede optar por buscar atencin mdica  en el consultorio de su doctor(a), en una clnica privada, en un centro de atencin urgente o en una sala de emergencias.  Si tiene Engineer, drilling, por favor llame inmediatamente al 911 o vaya a la sala de emergencias.  Nmeros de bper  - Dr. Gwen Pounds: [phone]  - Dra. Moye: [phone]  - Dra. Roseanne Reno: [phone]  En caso de inclemencias del [place], por favor llame a Lacy Duverney principal al [phone] para una actualizacin sobre el [place] de cualquier retraso o cierre.  Consejos para la medicacin en dermatologa: Por favor, guarde las cajas en las que vienen los medicamentos de uso tpico para ayudarle a seguir las instrucciones sobre dnde y cmo usarlos. Las farmacias generalmente imprimen las instrucciones del medicamento slo en las cajas y no directamente en los tubos del [place].   Si su medicamento es muy caro, por favor, pngase en contacto con Rolm Gala llamando al [phone] y presione la opcin 4 o envenos un mensaje a travs de Clinical cytogeneticist.   No podemos decirle cul ser su copago por los medicamentos por adelantado ya que esto es diferente dependiendo de la cobertura de su seguro. Sin embargo, es posible que podamos encontrar un medicamento sustituto a Audiological scientist un formulario para que el seguro cubra el medicamento que se considera necesario.   Si se requiere una autorizacin previa para  que su compaa de seguros [place] su medicamento, por favor permtanos de 1 a 2 das hbiles para completar [address]  Los precios de los medicamentos varan con frecuencia dependiendo del Environmental consultant de dnde se surte la receta y alguna farmacias pueden ofrecer precios ms baratos.  El sitio web www.goodrx.com tiene cupones para medicamentos de Health and safety inspector. Los precios aqu no tienen en cuenta lo que podra costar con la ayuda del seguro (puede ser ms barato con su seguro), pero el sitio web puede darle el precio si no utiliz Tourist information centre manager.  - Puede imprimir el cupn correspondiente y llevarlo con su receta a la farmacia.  - Tambin puede pasar por nuestra oficina durante el horario de atencin regular y Education officer, museum una tarjeta de cupones de GoodRx.  - Si necesita que su receta se enve electrnicamente a una farmacia diferente, informe a nuestra oficina a travs de MyChart de [place] o por telfono llamando al [phone] y presione la opcin 4.

## 2021-04-14 NOTE — Progress Notes (Signed)
Follow-Up Visit   Subjective  Sylvia Cantrell is a 46 y.o. female who presents for the following: Follow-up (Patient here today for eczema follow up at upper thigh, legs, elbows. She states some areas have improved but still itchy. She reports hand dermatitis has improved. ).  Patient is still itching on thighs, legs, arms, and hands  Patient reports she has had eczema for many years 3 years on legs 6 months on elbows  Patient reports history of asthma and season allergies  Used hydrocortisone 2.5 % cream, Elidel, tacrolimus 0.1 % ointment, betamethasone dipropionate 0.05 % cream, and triamcinolone.     The following portions of the chart were reviewed this encounter and updated as appropriate:  Tobacco   Allergies   Meds   Problems   Med Hx   Surg Hx   Fam Hx       Review of Systems: No other skin or systemic complaints except as noted in HPI or Assessment and Plan.   Objective  Well appearing patient in no apparent distress; mood and affect are within normal limits.  A focused examination was performed including elbows, legs, thigh, arms, back. Relevant physical exam findings are noted in the Assessment and Plan.  legs, thighs, elbows, arms, and hands. Pink excoriated plaque on left pretibial with lichenification, excoriated papules on elbows, mild xerosis on hands itching on thighs, legs, arms, and hands   BSA ~12%   Assessment & Plan  Atopic dermatitis, unspecified type legs, thighs, elbows, arms, and hands.  Severe, not responding to topical treatment  Atopic dermatitis (eczema) is a chronic, relapsing, pruritic condition that can significantly affect quality of life. It is often associated with allergic rhinitis and/or asthma and can require treatment with topical medications, phototherapy, or in severe cases biologic injectable medication (Dupixent; Adbry) or Oral JAK inhibitors.  Dupilumab (Dupixent) is a treatment given by injection for adults and children with  moderate-to-severe atopic dermatitis. Goal is control of skin condition, not cure. It is given as 2 injections at the first dose followed by 1 injection ever 2 weeks thereafter.  Young children are dosed monthly.  Potential side effects include allergic reaction, herpes infections, injection site reactions and conjunctivitis (inflammation of the eyes).  The use of Dupixent requires long term medication management, including periodic office visits.  Will submit PA for Dupixent  Restart betamethasone dipropionate 0.05 cream use twice daily to elbows and aas leg  Continue tacrolimus 0.1 % ointment apply area rash 1 - 2 times a day until improved.   Recommend mild soap and moisturizing cream 1-2 times daily.  Gentle skin care handout provided.    Dupilumab (DUPIXENT) 300 MG/2ML SOPN - legs, thighs, elbows, arms, and hands. Inject 600 mg into the skin once for 1 dose. On day 1.  Dupilumab (DUPIXENT) 300 MG/2ML SOPN - legs, thighs, elbows, arms, and hands. Inject 300 mg into the skin every 14 (fourteen) days. Starting at day 15 for maintenance.  Related Medications tacrolimus (PROTOPIC) 0.1 % ointment Apply to affected areas rash 1-2 times a day until improved.  betamethasone dipropionate 0.05 % cream Apply once to twice daily to more severely affected areas of rash on legs, elbows, spot treat on hands until improved. Avoid face, groin, underarms.   Return for 1 month eczema . I, Asher Muir, CMA, am acting as scribe for Willeen Niece, MD.  Documentation: I have reviewed the above documentation for accuracy and completeness, and I agree with the above.  Willeen Niece MD

## 2021-05-19 ENCOUNTER — Ambulatory Visit (INDEPENDENT_AMBULATORY_CARE_PROVIDER_SITE_OTHER): Payer: 59 | Admitting: Dermatology

## 2021-05-19 ENCOUNTER — Other Ambulatory Visit: Payer: Self-pay

## 2021-05-19 DIAGNOSIS — Z79899 Other long term (current) drug therapy: Secondary | ICD-10-CM

## 2021-05-19 DIAGNOSIS — L2089 Other atopic dermatitis: Secondary | ICD-10-CM

## 2021-05-19 MED ORDER — DUPILUMAB 300 MG/2ML ~~LOC~~ SOSY
600.0000 mg | PREFILLED_SYRINGE | Freq: Once | SUBCUTANEOUS | Status: AC
  Administered 2021-05-19: 600 mg via SUBCUTANEOUS

## 2021-05-19 NOTE — Progress Notes (Signed)
° °  Follow-Up Visit   Subjective  Sylvia Cantrell is a 46 y.o. female who presents for the following: Dermatitis (Patient here today to start Dupixent injections for atopic dermatitis. Patient is also using betamethasone cream, insurance would not cover tacrolimus. ). But she has used it in the past without much improvement..  Patient has asthma and allergies.  Patient's maintenance dose of Dupixent has been approved, loading dose is pending.   The following portions of the chart were reviewed this encounter and updated as appropriate:       Review of Systems:  No other skin or systemic complaints except as noted in HPI or Assessment and Plan.  Objective  Well appearing patient in no apparent distress; mood and affect are within normal limits.  A focused examination was performed including arms, legs. Relevant physical exam findings are noted in the Assessment and Plan.  legs, thighs, elbows, arms, and hands Pink scaly patches and papules on bilateral elbows and left pretibia    Assessment & Plan  Other atopic dermatitis legs, thighs, elbows, arms, and hands  Severe, not responding to topical treatment   Dupixent injections have been approved, but awaiting shipment.   Loading dose given to patient today in office using samples, Dupixent 600mg /73mL subcutaneously injected into the left and right upper arms. Patient tolerated well. Lot 3m Exp 02/2023 NDC 03/2023.  Patient was instructed on how to self-inject Dupixent pen 300mg /52mL every 2 weeks for maintenance dose.   Continue betamethasone cream QD/BID AAs prn. Avoid face, groin, axilla.    Dupilumab (Dupixent) is a treatment given by injection for adults and children with moderate-to-severe atopic dermatitis. Goal is control of skin condition, not cure. It is given as 2 injections at the first dose followed by 1 injection ever 2 weeks thereafter.  Young children are dosed monthly.  Potential side effects include  allergic reaction, herpes infections, injection site reactions and conjunctivitis (inflammation of the eyes).  The use of Dupixent requires long term medication management, including periodic office visits.  Topical steroids (such as triamcinolone, fluocinolone, fluocinonide, mometasone, clobetasol, halobetasol, betamethasone, hydrocortisone) can cause thinning and lightening of the skin if they are used for too long in the same area. Your physician has selected the right strength medicine for your problem and area affected on the body. Please use your medication only as directed by your physician to prevent side effects.    Related Medications dupilumab (DUPIXENT) prefilled syringe 600 mg    Return in about 3 months (around 08/16/2021) for Atopic Dermatitis.  I3m, CMA, am acting as scribe for 10/16/2021, MD .  Documentation: I have reviewed the above documentation for accuracy and completeness, and I agree with the above.  Cherlyn Labella MD

## 2021-05-19 NOTE — Patient Instructions (Addendum)
 Dupilumab (Dupixent) is a treatment given by injection for adults and children with moderate-to-severe atopic dermatitis. Goal is control of skin condition, not cure. It is given as 2 injections at the first dose followed by 1 injection ever 2 weeks thereafter.  Young children are dosed monthly.  Potential side effects include allergic reaction, herpes infections, injection site reactions and conjunctivitis (inflammation of the eyes).  The use of Dupixent requires long term medication management, including periodic office visits.;   If You Need Anything After Your Visit  If you have any questions or concerns for your doctor, please call our main line at [phone] and press option 4 to reach your doctor's medical assistant. If no one answers, please leave a voicemail as directed and we will return your call as soon as possible. Messages left after 4 pm will be answered the following business day.   You may also send us a message via MyChart. We typically respond to MyChart messages within 1-2 business days.  For prescription refills, please ask your pharmacy to contact our office. Our fax number is [phone].  If you have an urgent issue when the clinic is closed that cannot wait until the next business day, you can page your doctor at the number below.    Please note that while we do our best to be available for urgent issues outside of office hours, we are not available 24/7.   If you have an urgent issue and are unable to reach us, you may choose to seek medical care at your doctor's office, retail clinic, urgent care center, or emergency room.  If you have a medical emergency, please immediately call 911 or go to the emergency department.  Pager Numbers  - Dr. Kowalski: [phone]  - Dr. Moye: [phone]  - Dr. Stewart: [phone]  In the event of inclement weather, please call our main line at [phone] for an update on the status of any delays or  closures.  Dermatology Medication Tips: Please keep the boxes that topical medications come in in order to help keep track of the instructions about where and how to use these. Pharmacies typically print the medication instructions only on the boxes and not directly on the medication tubes.   If your medication is too expensive, please contact our office at [phone] option 4 or send us a message through MyChart.   We are unable to tell what your co-pay for medications will be in advance as this is different depending on your insurance coverage. However, we may be able to find a substitute medication at lower cost or fill out paperwork to get insurance to cover a needed medication.   If a prior authorization is required to get your medication covered by your insurance company, please allow us 1-2 business days to complete this process.  Drug prices often vary depending on where the prescription is filled and some pharmacies may offer cheaper prices.  The website www.goodrx.com contains coupons for medications through different pharmacies. The prices here do not account for what the cost may be with help from insurance (it may be cheaper with your insurance), but the website can give you the price if you did not use any insurance.  - You can print the associated coupon and take it with your prescription to the pharmacy.  - You may also stop by our office during regular business hours and pick up a GoodRx coupon card.  - If you need your prescription sent electronically to a different   pharmacy, notify our office through [facility] MyChart or by phone at [phone] option 4.     Si Usted Necesita Algo Despus de Su Visita  Tambin puede enviarnos un mensaje a travs de MyChart. Por lo general respondemos a los mensajes de MyChart en el transcurso de 1 a 2 das hbiles.  Para renovar recetas, por favor pida a su farmacia que se ponga en contacto con nuestra oficina. Nuestro nmero de fax  es el [phone].  Si tiene un asunto urgente cuando la clnica est cerrada y que no puede esperar hasta el siguiente da hbil, puede llamar/localizar a su doctor(a) al nmero que aparece a continuacin.   Por favor, tenga en cuenta que aunque hacemos todo lo posible para estar disponibles para asuntos urgentes fuera del horario de oficina, no estamos disponibles las 24 horas del da, los 7 das de la semana.   Si tiene un problema urgente y no puede comunicarse con nosotros, puede optar por buscar atencin mdica  en el consultorio de su doctor(a), en una clnica privada, en un centro de atencin urgente o en una sala de emergencias.  Si tiene una emergencia mdica, por favor llame inmediatamente al 911 o vaya a la sala de emergencias.  Nmeros de bper  - Dr. Kowalski: [phone]  - Dra. Moye: [phone]  - Dra. Stewart: [phone]  En caso de inclemencias del tiempo, por favor llame a nuestra lnea principal al [phone] para una actualizacin sobre el estado de cualquier retraso o cierre.  Consejos para la medicacin en dermatologa: Por favor, guarde las cajas en las que vienen los medicamentos de uso tpico para ayudarle a seguir las instrucciones sobre dnde y cmo usarlos. Las farmacias generalmente imprimen las instrucciones del medicamento slo en las cajas y no directamente en los tubos del medicamento.   Si su medicamento es muy caro, por favor, pngase en contacto con nuestra oficina llamando al [phone] y presione la opcin 4 o envenos un mensaje a travs de MyChart.   No podemos decirle cul ser su copago por los medicamentos por adelantado ya que esto es diferente dependiendo de la cobertura de su seguro. Sin embargo, es posible que podamos encontrar un medicamento sustituto a menor costo o llenar un formulario para que el seguro cubra el medicamento que se considera necesario.   Si se requiere una autorizacin previa para que su compaa de seguros cubra  su medicamento, por favor permtanos de 1 a 2 das hbiles para completar este proceso.  Los precios de los medicamentos varan con frecuencia dependiendo del lugar de dnde se surte la receta y alguna farmacias pueden ofrecer precios ms baratos.  El sitio web www.goodrx.com tiene cupones para medicamentos de diferentes farmacias. Los precios aqu no tienen en cuenta lo que podra costar con la ayuda del seguro (puede ser ms barato con su seguro), pero el sitio web puede darle el precio si no utiliz ningn seguro.  - Puede imprimir el cupn correspondiente y llevarlo con su receta a la farmacia.  - Tambin puede pasar por nuestra oficina durante el horario de atencin regular y recoger una tarjeta de cupones de GoodRx.  - Si necesita que su receta se enve electrnicamente a una farmacia diferente, informe a nuestra oficina a travs de MyChart de [facility] o por telfono llamando al [phone] y presione la opcin 4.  

## 2021-07-29 ENCOUNTER — Telehealth: Payer: Self-pay

## 2021-07-29 NOTE — Telephone Encounter (Signed)
Updating patient's Dupixent PA through [facility]. They are wanting clinical documentation of improvement on therapy.  ?Please make clear notes during 08/17/21 office visit.  ? ?Thank you.  ?Will [facility] to [facility] [phone] when completed. aw ?

## 2021-08-04 ENCOUNTER — Ambulatory Visit: Payer: 59

## 2021-08-09 ENCOUNTER — Ambulatory Visit
Admission: RE | Admit: 2021-08-09 | Discharge: 2021-08-09 | Disposition: A | Discharge status: Home / Self Care | Payer: 59 | Source: Ambulatory Visit | Attending: Family Medicine | Admitting: Family Medicine

## 2021-08-09 ENCOUNTER — Telehealth: Payer: Self-pay | Admitting: Family Medicine

## 2021-08-09 VITALS — BP 116/79 | HR 92 | Temp 98.2°F | Resp 18

## 2021-08-09 DIAGNOSIS — J45909 Unspecified asthma, uncomplicated: Secondary | ICD-10-CM | POA: Diagnosis not present

## 2021-08-09 DIAGNOSIS — R0989 Other specified symptoms and signs involving the circulatory and respiratory systems: Secondary | ICD-10-CM | POA: Diagnosis not present

## 2021-08-09 MED ORDER — PROMETHAZINE-DM 6.25-15 MG/5ML PO SYRP: 5.0000 mL | ORAL_SOLUTION | Freq: Three times a day (TID) | ORAL | 0 refills | Status: AC | PRN

## 2021-08-09 MED ORDER — PREDNISONE 20 MG PO TABS: 40.0000 mg | ORAL_TABLET | Freq: Every day | ORAL | 0 refills | Status: AC

## 2021-08-09 NOTE — ED Triage Notes (Addendum)
Pt c/o cough, chest congestion and fever x 3 days  ?

## 2021-08-09 NOTE — ED Provider Notes (Signed)
Renaldo Fiddler    CSN: [id number] Arrival date & time: 08/09/21  4098      History   Chief Complaint Chief Complaint  Patient presents with   Cough    I have been on antibiotics for 5 days.  Macrobid for a diagnosed Bladder infection. On the 4th day of antibiotics I developed a cough with chest tightness.  Today fever resumed, but is manageable with ibuprofen.   without ibuprofen is 101-102 - Entered by patient    HPI Sylvia Cantrell is a 46 y.o. female.   HPI Patient with a history of asthma presents today with three day history of cough, chest congestions, and fever. Fever TMAX102 last night. No known sick contacts. She endorses a mild sore throat although attributes to coughing. She has taken ibuprofen with minimal relief of symptoms. She feels that she is not able to take a complete  deep breaths due to coughing. No active wheezing although endorses chest tightness.  Past Medical History:  Diagnosis Date   ADHD    Allergy    Anemia    Anxiety    Asthma    Chronic headache    Depression    Hypertension    IBS (irritable bowel syndrome)    Kidney stones    Nephrolithiasis    Obesity    Pancreatic mass    Pneumonia    Seizures (HCC)     Patient Active Problem List   Diagnosis Date Noted   S/P splenectomy 08/14/2019   Essential hypertension, benign 08/14/2019   History of partial pancreatectomy 08/14/2019   Sepsis due to undetermined organism (HCC) 04/26/2019   Hypophosphatemia 04/26/2019   Hypokalemia 04/26/2019   Pancreatic mass 04/26/2019   Pneumonia 04/25/2019   Elevated blood pressure reading 09/12/2018   Mild intermittent asthma without complication 09/12/2018   Anemia 05/02/2018   Neck pain 06/21/2017   Anterior pleuritic pain 06/21/2017   Iron deficiency anemia due to chronic blood loss 02/28/2017   Situational anxiety 11/17/2016   Menorrhagia with regular cycle 08/11/2016   Anemia due to chronic blood loss 08/11/2016   NEPHROLITHIASIS, HX  OF 02/15/2010   ALLERGIC RHINITIS 02/11/2010   Asthma 02/11/2010    Past Surgical History:  Procedure Laterality Date   ABLATION     BREAST SURGERY     DILATION AND CURETTAGE OF UTERUS     ESOPHAGOGASTRODUODENOSCOPY (EGD) WITH PROPOFOL N/A 05/24/2019   Procedure: ESOPHAGOGASTRODUODENOSCOPY (EGD) WITH PROPOFOL;  Surgeon: Rachael Fee, MD;  Location: [facility];  Service: Endoscopy;  Laterality: N/A;  category 2    EUS N/A 05/24/2019   Procedure: UPPER ENDOSCOPIC ULTRASOUND (EUS) RADIAL;  Surgeon: Rachael Fee, MD;  Location: [facility];  Service: Endoscopy;  Laterality: N/A;  category 2    FINE NEEDLE ASPIRATION N/A 05/24/2019   Procedure: FINE NEEDLE ASPIRATION (FNA) LINEAR;  Surgeon: Rachael Fee, MD;  Location: [facility];  Service: Endoscopy;  Laterality: N/A;   IR RADIOLOGIST EVAL & MGMT  08/01/2019   PANCREATECTOMY N/A 07/04/2019   Procedure: HAND ASSISTED DISTAL PANCREATECTOMY/SPLENECTOMY;  Surgeon: Almond Lint, MD;  Location: [facility] OR;  Service: General;  Laterality: N/A;   TONSILLECTOMY  1987   WISDOM TOOTH EXTRACTION      OB History   No obstetric history on file.      Home Medications    Prior to Admission medications   Medication Sig Start Date End Date Taking? Authorizing Provider  acetaminophen (TYLENOL) 325 MG tablet Take 650 mg by mouth  every 6 (six) hours as needed for moderate pain or headache.    Yes [provider]  albuterol (VENTOLIN HFA) 108 (90 Base) MCG/ACT inhaler Inhale 2 puffs into the lungs every 6 (six) hours as needed for wheezing or shortness of breath. Patient taking differently: Inhale 2 puffs into the lungs every 4 (four) hours as needed for wheezing or shortness of breath. 04/17/19  Yes Lezlie Lye, Irma M, MD  Dupilumab (DUPIXENT) 300 MG/2ML SOPN Inject 300 mg into the skin every 14 (fourteen) days. Starting at day 15 for maintenance. 04/14/21  Yes Willeen Niece, MD  escitalopram (LEXAPRO) 5 MG tablet TAKE 1 TABLET(5 MG) BY  MOUTH DAILY 08/14/19  Yes Lezlie Lye, Irma M, MD  fluticasone [facility]) 50 MCG/ACT nasal spray Place 2 sprays into both nostrils daily. Patient taking differently: Place 2 sprays into both nostrils daily as needed for allergies. 10/27/17  Yes Collie Siad A, MD  ibuprofen (ADVIL) 800 MG tablet Take 1 tablet (800 mg total) by mouth every 8 (eight) hours as needed. 02/11/20  Yes Mickie Bail, NP  predniSONE (DELTASONE) 20 MG tablet Take 2 tablets (40 mg total) by mouth daily with breakfast. 08/09/21  Yes Bing Neighbors, FNP  promethazine-dextromethorphan (PROMETHAZINE-DM) 6.25-15 MG/5ML syrup Take 5 mLs by mouth 3 (three) times daily as needed for cough. 08/09/21  Yes Bing Neighbors, FNP  betamethasone dipropionate 0.05 % cream Apply once to twice daily to more severely affected areas of rash on legs, elbows, spot treat on hands until improved. Avoid face, groin, underarms. 03/02/21   Willeen Niece, MD  methylphenidate 27 MG PO CR tablet Take 27 mg by mouth daily. 06/12/20   [provider]  tacrolimus (PROTOPIC) 0.1 % ointment Apply to affected areas rash 1-2 times a day until improved. 03/02/21   Willeen Niece, MD    Family History Family History  Problem Relation Age of Onset   Hypertension Mother    Hyperthyroidism Mother    Heart failure Father    Heart failure Maternal Grandmother    Hypertension Maternal Grandmother    Kidney disease Maternal Grandmother    Heart failure Paternal Grandfather     Social History Social History   Tobacco Use   Smoking status: Former    Types: Cigarettes   Smokeless tobacco: Never   Tobacco comments:    quit smoking in college  Vaping Use   Vaping Use: Never used  Substance Use Topics   Alcohol use: Not Currently    Alcohol/week: 0.0 standard drinks   Drug use: No     Allergies   Amoxicillin and Codeine   Review of Systems Review of Systems Pertinent negatives listed in HPI  Physical Exam Triage Vital Signs ED Triage  Vitals  Enc Vitals Group     BP      Pulse      Resp      Temp      Temp src      SpO2      Weight      Height      Head Circumference      Peak Flow      Pain Score      Pain Loc      Pain Edu?      Excl. in GC?    No data found.  Updated Vital Signs BP 116/79 (BP Location: Left Arm)   Pulse 92   Temp 98.2 F (36.8 C) (Oral)   Resp 18  SpO2 96%   Visual Acuity Right Eye Distance:   Left Eye Distance:   Bilateral Distance:    Right Eye Near:   Left Eye Near:    Bilateral Near:     Physical Exam Constitutional:      Appearance: She is ill-appearing.  HENT:     Head: Normocephalic and atraumatic.     Nose: Congestion present.     Mouth/Throat:     Pharynx: No oropharyngeal exudate or posterior oropharyngeal erythema.  Eyes:     Extraocular Movements: Extraocular movements intact.     Pupils: Pupils are equal, round, and reactive to light.  Cardiovascular:     Rate and Rhythm: Normal rate and regular rhythm.  Pulmonary:     Breath sounds: Rhonchi present. No wheezing.  Skin:    General: Skin is warm and dry.     Capillary Refill: Capillary refill takes less than 2 seconds.  Neurological:     General: No focal deficit present.     Mental Status: She is alert.  Psychiatric:        Mood and Affect: Mood normal.     UC Treatments / Results  Labs (all labs ordered are listed, but only abnormal results are displayed) Labs Reviewed - No data to display  EKG   Radiology No results found.  Procedures Procedures (including critical care time)  Medications Ordered in UC Medications - No data to display  Initial Impression / Assessment and Plan / UC Course  I have reviewed the triage vital signs and the nursing notes.  Pertinent labs & imaging results that were available during my care of the patient were reviewed by me and considered in my medical decision making (see chart for details).    Acute viral upper respiratory illness with history of  asthma. Will treat with a burst of prednisone and promethazine DM for cough. Resume Albuterol inhaler and allergy medication. Given high fever, I would recommend retesting to rule out an active COVID infection as the source of symptoms. Final Clinical Impressions(s) / UC Diagnoses   Final diagnoses:  Upper respiratory symptom  Asthma, unspecified asthma severity, unspecified whether complicated, unspecified whether persistent     Discharge Instructions      Resume use of albuterol inhaler. Resume daily allergy medication. Take a home COVID test today if negative repeat in 2 days given your fever. Take prescribed medication as prescribed. Follow-up with PCP or return for evaluation.     ED Prescriptions     Medication Sig Dispense Auth. Provider   predniSONE (DELTASONE) 20 MG tablet Take 2 tablets (40 mg total) by mouth daily with breakfast. 10 tablet Bing Neighbors, FNP   promethazine-dextromethorphan (PROMETHAZINE-DM) 6.25-15 MG/5ML syrup Take 5 mLs by mouth 3 (three) times daily as needed for cough. 180 mL Bing Neighbors, FNP      PDMP not reviewed this encounter.   Bing Neighbors, [place] 08/09/21 [phone]

## 2021-08-09 NOTE — Telephone Encounter (Signed)
Pt was seen today and took an at home covid test and it was positive. Patient offered anti-virals but she declined and will continue treatment plan provided in the clinic. Pt advised to been seen if symptoms become worse. She agreed.  ?

## 2021-08-09 NOTE — Discharge Instructions (Addendum)
Resume use of albuterol inhaler. ?Resume daily allergy medication. ?Take a home COVID test today if negative repeat in 2 days given your fever. Take prescribed medication as prescribed. ?Follow-up with PCP or return for evaluation. ?

## 2021-08-17 ENCOUNTER — Ambulatory Visit: Payer: 59 | Admitting: Dermatology

## 2022-01-01 ENCOUNTER — Emergency Department
Admission: EM | Admit: 2022-01-01 | Discharge: 2022-01-01 | Disposition: A | Discharge status: Home / Self Care | Payer: 59 | Attending: Emergency Medicine | Admitting: Emergency Medicine

## 2022-01-01 ENCOUNTER — Emergency Department: Payer: 59

## 2022-01-01 ENCOUNTER — Ambulatory Visit: Payer: 59

## 2022-01-01 ENCOUNTER — Other Ambulatory Visit: Payer: Self-pay

## 2022-01-01 ENCOUNTER — Encounter: Payer: Self-pay | Admitting: Emergency Medicine

## 2022-01-01 DIAGNOSIS — K5732 Diverticulitis of large intestine without perforation or abscess without bleeding: Secondary | ICD-10-CM | POA: Diagnosis not present

## 2022-01-01 DIAGNOSIS — R1032 Left lower quadrant pain: Secondary | ICD-10-CM | POA: Diagnosis present

## 2022-01-01 LAB — URINALYSIS, ROUTINE W REFLEX MICROSCOPIC
Glucose, UA: NEGATIVE mg/dL
Ketones, ur: NEGATIVE mg/dL
Leukocytes,Ua: NEGATIVE
Nitrite: NEGATIVE
Protein, ur: NEGATIVE mg/dL
Specific Gravity, Urine: >1.03 — ABNORMAL HIGH (ref 1.005–1.030)
pH: 5 (ref 5.0–8.0)

## 2022-01-01 LAB — LIPASE, BLOOD: Lipase: 40 U/L (ref 11–51)

## 2022-01-01 LAB — CBC
HCT: 41.5 % (ref 36.0–46.0)
Hemoglobin: 13.4 g/dL (ref 12.0–15.0)
MCH: 27.8 pg (ref 26.0–34.0)
MCHC: 32.3 g/dL (ref 30.0–36.0)
MCV: 86.1 fL (ref 80.0–100.0)
Platelets: 374 10*3/uL (ref 150–400)
RBC: 4.82 MIL/uL (ref 3.87–5.11)
RDW: 13.7 % (ref 11.5–15.5)
WBC: 9.6 10*3/uL (ref 4.0–10.5)
nRBC: 0 % (ref 0.0–0.2)

## 2022-01-01 LAB — COMPREHENSIVE METABOLIC PANEL
ALT: 14 U/L (ref 0–44)
AST: 16 U/L (ref 15–41)
Albumin: 4 g/dL (ref 3.5–5.0)
Alkaline Phosphatase: 87 U/L (ref 38–126)
Anion gap: 7 (ref 5–15)
BUN: 13 mg/dL (ref 6–20)
CO2: 28 mmol/L (ref 22–32)
Calcium: 9.2 mg/dL (ref 8.9–10.3)
Chloride: 105 mmol/L (ref 98–111)
Creatinine, Ser: 0.96 mg/dL (ref 0.44–1.00)
GFR, Estimated: >60 mL/min (ref >60)
Glucose, Bld: 137 mg/dL — ABNORMAL HIGH (ref 70–99)
Potassium: 3.8 mmol/L (ref 3.5–5.1)
Sodium: 140 mmol/L (ref 135–145)
Total Bilirubin: 0.6 mg/dL (ref 0.3–1.2)
Total Protein: 7.6 g/dL (ref 6.5–8.1)

## 2022-01-01 LAB — POC URINE PREG, ED: Preg Test, Ur: NEGATIVE

## 2022-01-01 MED ORDER — HYDROCODONE-ACETAMINOPHEN 5-325 MG PO TABS: 1.0000 | ORAL_TABLET | Freq: Four times a day (QID) | ORAL | 0 refills | Status: AC | PRN

## 2022-01-01 MED ORDER — IOHEXOL 300 MG/ML  SOLN
100.0000 mL | Freq: Once | INTRAMUSCULAR | Status: AC | PRN
  Administered 2022-01-01: 100 mL via INTRAVENOUS

## 2022-01-01 MED ORDER — METRONIDAZOLE 500 MG PO TABS: 500.0000 mg | ORAL_TABLET | Freq: Three times a day (TID) | ORAL | 0 refills | Status: AC

## 2022-01-01 MED ORDER — CIPROFLOXACIN HCL 500 MG PO TABS: 500.0000 mg | ORAL_TABLET | Freq: Two times a day (BID) | ORAL | 0 refills | Status: AC

## 2022-01-01 NOTE — Discharge Instructions (Signed)
Take the antibiotic as directed.  Call to schedule follow-up appointment with the gastroenterologist.  If your symptoms change or worsen and you are unable to see primary care or the gastroenterologist, please return to the emergency department.

## 2022-01-01 NOTE — ED Provider Notes (Signed)
[facility] Provider Note    Event Date/Time   First MD Initiated Contact with Patient 01/01/22 1656     (approximate)   History   Abdominal Pain   HPI  Sylvia Cantrell is a 46 y.o. female with a history of iron deficiency anemia, hypokalemia, partial splenectomy and partial pancreatectomy presents to the emergency department for treatment and evaluation of left lower quadrant pain that started 2 days ago.  Pain has consistently stayed in the left lower quadrant.  She has had no nausea, vomiting, or diarrhea.  She has a history of IBS and therefore has various bowel habits.  She has not noticed any blood or froth in her stool since pain began.  No known history of diverticulosis or diverticulitis.  Pain has been persistent since onset.  No dysuria or other urinary concerns.  She states pain is directly behind and abdominal scar from a peritoneal drain that was placed after having a partial splenectomy and pancreatectomy.      Physical Exam   Triage Vital Signs: ED Triage Vitals [01/01/22 1540]  Enc Vitals Group     BP (!) 154/98     Pulse Rate 82     Resp 16     Temp 98.4 F (36.9 C)     Temp Source Oral     SpO2 95 %     Weight 225 lb 1.4 oz (102.1 kg)     Height 5\' 6"  (1.676 m)     Head Circumference      Peak Flow      Pain Score 4     Pain Loc      Pain Edu?      Excl. in GC?     Most recent vital signs: Vitals:   01/01/22 1540 01/01/22 1742  BP: (!) 154/98 128/80  Pulse: 82 72  Resp: 16 18  Temp: 98.4 F (36.9 C) 98.6 F (37 C)  SpO2: 95% 96%     General: Awake, no distress.  CV:  Good peripheral perfusion.  Resp:  Normal effort.  Abd:  No distention.  Other:  Left lower quadrant tender on palpation.   ED Results / Procedures / Treatments   Labs (all labs ordered are listed, but only abnormal results are displayed) Labs Reviewed  COMPREHENSIVE METABOLIC PANEL - Abnormal; Notable for the following components:       Result Value   Glucose, Bld 137 (*)    All other components within normal limits  URINALYSIS, ROUTINE W REFLEX MICROSCOPIC - Abnormal; Notable for the following components:   APPearance CLEAR (*)    Specific Gravity, Urine >1.030 (*)    Hgb urine dipstick MODERATE (*)    Bilirubin Urine SMALL (*)    Bacteria, UA RARE (*)    All other components within normal limits  LIPASE, BLOOD  CBC  POC URINE PREG, ED     EKG     RADIOLOGY CT of the abdomen and pelvis shows an acute diverticulitis of the descending colon.  Image interpreted and reviewed by me. Radiology report reviewed and is consistent with above findings.    PROCEDURES:  Critical Care performed: No  Procedures   MEDICATIONS ORDERED IN ED: Medications  iohexol (OMNIPAQUE) 300 MG/ML solution 100 mL (100 mLs Intravenous Contrast Given 01/01/22 1819)     IMPRESSION / MDM / ASSESSMENT AND PLAN / ED COURSE  I reviewed the triage vital signs and the nursing notes.  Differential diagnosis includes, but is not limited to, diverticulitis, colitis, adhesions, hernia, intra-abdominal pathology  Patient's presentation is most consistent with acute presentation with potential threat to life or bodily function.  46 year old female presenting to the emergency department for treatment and evaluation of left lower quadrant pain that has been present for the past 2 days.  See HPI for further details.  While awaiting ER room assignment, lab studies were obtained and are all reassuring.  Urinalysis does show moderate hemoglobin with small amount of bilirubin and rare bacteria with 6-10 white blood cells.  Negative for leukocytes or nitrates.  Exam findings include focal left lower quadrant abdominal tenderness.  Plan will be to get a CT of the abdomen and pelvis with contrast to rule out differentials, specifically diverticulitis and adhesions.  Acute diverticulitis identified on the CT of the abdomen  and pelvis.  Patient has had symptoms for 2 days and has already tried diet changes which have not provided any relief therefore she will take ciprofloxacin and Flagyl.  She was encouraged to call and schedule follow-up appointment with gastroenterology.  If she is not improving by Monday, she is to try and get in with her primary care provider.  If symptoms change or worsen over the weekend she is to return to the emergency department.     FINAL CLINICAL IMPRESSION(S) / ED DIAGNOSES   Final diagnoses:  Diverticulitis of descending colon     Rx / DC Orders   ED Discharge Orders          Ordered    ciprofloxacin (CIPRO) 500 MG tablet  2 times daily        01/01/22 1855    metroNIDAZOLE (FLAGYL) 500 MG tablet  3 times daily        01/01/22 1855    HYDROcodone-acetaminophen (NORCO/VICODIN) 5-325 MG tablet  Every 6 hours PRN        01/01/22 1855             Note:  This document was prepared using Dragon voice recognition software and may include unintentional dictation errors.   Victorino Dike, FNP 01/01/22 1911    Delman Kitten, MD 01/01/22 1942

## 2022-01-01 NOTE — ED Triage Notes (Signed)
C/O LLQ abd/pelvic pain since Wednesday.  Pain worse when laying down.  Also nausea.  Last BM:  01/01/2022

## 2022-03-19 ENCOUNTER — Other Ambulatory Visit: Payer: Self-pay | Admitting: Student

## 2022-03-19 DIAGNOSIS — Z1231 Encounter for screening mammogram for malignant neoplasm of breast: Secondary | ICD-10-CM

## 2024-05-02 ENCOUNTER — Ambulatory Visit: Payer: Self-pay

## 2024-05-02 ENCOUNTER — Encounter: Payer: Self-pay | Admitting: Hematology and Oncology

## 2024-05-02 DIAGNOSIS — K5730 Diverticulosis of large intestine without perforation or abscess without bleeding: Secondary | ICD-10-CM | POA: Diagnosis not present

## 2024-05-02 DIAGNOSIS — K642 Third degree hemorrhoids: Secondary | ICD-10-CM | POA: Diagnosis not present

## 2024-05-02 DIAGNOSIS — D128 Benign neoplasm of rectum: Secondary | ICD-10-CM | POA: Diagnosis not present

## 2024-05-02 DIAGNOSIS — Z1211 Encounter for screening for malignant neoplasm of colon: Secondary | ICD-10-CM | POA: Diagnosis present
